# Patient Record
Sex: Male | Born: 1945 | Race: White | Hispanic: No | Marital: Married | Smoking: Former smoker
Health system: Southern US, Community
[De-identification: ages and names within clinical notes are randomized; demographics above are authoritative.]

## PROBLEM LIST (undated history)

## (undated) DIAGNOSIS — C4490 Unspecified malignant neoplasm of skin, unspecified: Secondary | ICD-10-CM

## (undated) DIAGNOSIS — Z8489 Family history of other specified conditions: Secondary | ICD-10-CM

## (undated) DIAGNOSIS — I219 Acute myocardial infarction, unspecified: Secondary | ICD-10-CM

## (undated) DIAGNOSIS — I350 Nonrheumatic aortic (valve) stenosis: Secondary | ICD-10-CM

## (undated) DIAGNOSIS — R519 Headache, unspecified: Secondary | ICD-10-CM

## (undated) DIAGNOSIS — R490 Dysphonia: Secondary | ICD-10-CM

## (undated) DIAGNOSIS — D491 Neoplasm of unspecified behavior of respiratory system: Secondary | ICD-10-CM

## (undated) DIAGNOSIS — Z923 Personal history of irradiation: Secondary | ICD-10-CM

## (undated) DIAGNOSIS — I10 Essential (primary) hypertension: Secondary | ICD-10-CM

## (undated) DIAGNOSIS — C320 Malignant neoplasm of glottis: Secondary | ICD-10-CM

## (undated) HISTORY — PX: CARDIAC CATHETERIZATION: SHX172

## (undated) HISTORY — PX: CORONARY ANGIOPLASTY: SHX604

## (undated) HISTORY — DX: Essential (primary) hypertension: I10

## (undated) HISTORY — DX: Dysphonia: R49.0

## (undated) HISTORY — DX: Neoplasm of unspecified behavior of respiratory system: D49.1

## (undated) HISTORY — PX: COLONOSCOPY: SHX174

## (undated) HISTORY — DX: Unspecified malignant neoplasm of skin, unspecified: C44.90

## (undated) HISTORY — DX: Acute myocardial infarction, unspecified: I21.9

## (undated) HISTORY — PX: OTHER SURGICAL HISTORY: SHX169

---

## 2012-11-15 DIAGNOSIS — C320 Malignant neoplasm of glottis: Secondary | ICD-10-CM

## 2012-11-15 HISTORY — PX: OTHER SURGICAL HISTORY: SHX169

## 2012-11-15 HISTORY — DX: Malignant neoplasm of glottis: C32.0

## 2012-11-26 ENCOUNTER — Encounter: Payer: Self-pay | Admitting: Radiation Oncology

## 2012-11-26 ENCOUNTER — Ambulatory Visit
Admission: RE | Admit: 2012-11-26 | Discharge: 2012-11-26 | Disposition: A | Discharge status: Home / Self Care | Payer: Medicare Other | Source: Ambulatory Visit | Attending: Radiation Oncology | Admitting: Radiation Oncology

## 2012-11-26 VITALS — BP 177/77 | HR 53 | Temp 97.6°F | Wt 182.3 lb

## 2012-11-26 DIAGNOSIS — J383 Other diseases of vocal cords: Secondary | ICD-10-CM

## 2012-11-26 DIAGNOSIS — C320 Malignant neoplasm of glottis: Secondary | ICD-10-CM

## 2012-11-26 HISTORY — DX: Malignant neoplasm of glottis: C32.0

## 2012-11-26 MED ORDER — LARYNGOSCOPY SOLUTION RAD-ONC
15.0000 mL | Freq: Once | TOPICAL | Status: AC
  Administered 2012-11-26: 15 mL via TOPICAL
  Filled 2012-11-26: qty 15

## 2012-11-26 NOTE — Progress Notes (Signed)
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  Prior to diagnosis Mr. Marcus Collier states he began to have continual hoarseness but denies having any dysphagia or odonyphagia.  Denies any significant weight loss.

## 2012-11-26 NOTE — Progress Notes (Signed)
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 male who reports that about one year ago he was yelling quite a bit one day and the next morning he was hoarse. His hoarseness was persistent but nonprogressive for 6 months. He saw Dr. Jenne Pane of otolaryngology in May 2013. At that time laryngoscopy was performed in the left posterior vocal fold had a mild prominent mass covering the posterior half of the fold with no vibration. The vocal cords moved symmetrically.  The patient decided against any treatment at that time and wanted to see how his symptoms would do. His voice did not change significantly; however, after he underwent repeat laryngoscopy on 11/12/2012, it revealed that both true vocal cords appeared irregular with some prominence along the left vocal fold. The cords were mobile with symmetric movements..  Biopsies of both vocal cords were performed. 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 The right vocal cord revealed squamous cell carcinoma in situ with a focus of superficially invasive disease. The anterior commissure (labelled in path report as "the mouth" - I've discussed this with Dr. Jenne Pane and that was a typo) revealed fragments of "at least" squamous cell carcinoma in situ. Dr. Jenne Pane and I in discussion agree that this appears to be T1b disease.  The patient was sent to me for my advice/ counseling regarding management of what appears to be a clinical T1b N0 glottic cancer.    He is otherwise in his usual state of health. He denies any palpable lymph nodes in his neck. He denies any significant weight loss. He denies dysphagia or  odynophagia. 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  he has had skin cancers excised in the past  PAST SURGICAL HISTORY: Past Surgical History  Procedure Date  . Coronary artery surgery   . Biopsy of vocal cord 11/15/12    Right and Left  . Biospy mouth 11/15/12    FAMILY HISTORY: family history includes Heart attack in his father and mother and Hypertension in his father.  SOCIAL HISTORY:  reports that he quit smoking about 10 years ago. His smoking use included Cigarettes. He has a 52 pack-year smoking history. He does not have any smokeless tobacco history on file. He reports that he drinks alcohol.  ALLERGIES: Penicillins  MEDICATIONS:  Current Outpatient Prescriptions  Medication Sig Dispense Refill  . amLODipine (NORVASC) 5 MG tablet Take 5 mg by mouth daily.      Marland Kitchen aspirin 325 MG tablet Take 325 mg by mouth daily.      . carvedilol (COREG) 25 MG tablet 25 mg 2 (two) times daily with a meal.       . quinapril (ACCUPRIL) 40 MG tablet       . simvastatin (ZOCOR) 80 MG tablet        Current Facility-Administered Medications  Medication Dose Route Frequency Provider  Last Rate Last Dose  . [COMPLETED] laryngocopy solution for Rad-Onc  15 mL Topical Once Lonie Peak, MD   15 mL at 11/26/12 1047    REVIEW OF SYSTEMS: A complete review of system was obtained. Pertinent items are noted in HPI.   PHYSICAL EXAM:  weight is 182 lb 4.8 oz (82.691 kg). His temperature is 97.6 F (36.4 C). His blood pressure is 177/77 and his pulse is 53.   General: Alert and oriented, in no acute distress. He is hoarse HEENT: Head is  normocephalic. Pupils are equally round and reactive to light. Extraocular movements are intact. Oropharynx is clear with poor lower dentition; upper dentures. Neck: Neck is supple, no palpable cervical or supraclavicular lymphadenopathy. He does have protuberance of the sternal edge of the right clavicle Heart: Regular in rate and rhythm with no murmurs, rubs, or gallops. Chest: Clear to auscultation bilaterally, with no rhonchi, wheezes, or rales. Abdomen: Soft, nontender, nondistended, with no rigidity or guarding. Extremities: No cyanosis or edema. Lymphatics: No concerning lymphadenopathy. Skin: No concerning lesions. Musculoskeletal: symmetric strength and muscle tone throughout. Neurologic: Cranial nerves II through XII are grossly intact. No obvious focalities. Speech is fluent. Coordination is intact. Psychiatric: Judgment and insight are intact. Affect is appropriate.  Laryngoscopy procedure note: After administering local anesthetic, the laryngoscopy was advanced in the patient's left nostril. The true vocal cords appeared diffusely irregular bilaterally but I was unable to appreciate any obvious asymmetry. Cords were symmetrically mobile. I did not appreciate extension beyond the true vocal cords.   LABORATORY DATA:  No results found for this basename: WBC,  HGB,  HCT,  MCV,  PLT   CMP  No results found for this basename: na,  k,  cl,  co2,  glucose,  bun,  creatinine,  calcium,  prot,  albumin,  ast,  alt,  alkphos,  bilitot,  gfrnonaa,  gfraa        RADIOGRAPHY: No results found.    IMPRESSION/PLAN: This patient has what appears to be clinical T1bN0 glottic cancer - minimally invasive per path report.  He admits that he does smoke a cigarette every month or 2, and an occasional beer. I told him that both of these substances are carcinogenic I recommended that he avoid them as much as possible - ideally, he should not smoke or drink at all. He does not feel the need for any  pharmacologic help with this.  Statistically, the risk of local or distant metastases is very low. I spoke to the patient about this and told him that he could undergo further staging scans but I did not think this was imperative. He is comfortable with deferring any further staging which I think is appropriate.  For his early stage glottic cancer, I recommend external beam radiotherapy to a dose of 63 Gray in 28 fractions at 2.25 gray per fraction. I would treat with opposed lateral beams, extending from the thyroid notch superiorly to the bottom of the cricoid inferiorly,  from the anterior VB to 1cm flash beyond the skin. I will make sure that the dose the anterior commissure it is adequate.  We discussed the risks benefits and side effects of laryngeal radiotherapy which include but are not necessarily limited to fatigue, skin irritation and esophagitis in the acute setting. He understands that late effects may include rare damage to the internal tissues including the cartilage, larynx, and esophagus. He is enthusiastic about proceeding. A consent form has been signed and placed in his chart.  We will simulate him on December 9, anticipating starting treatment a week thereafter.    I spent 60 minutes minutes face to face with the patient and more than 50% of that time was spent in counseling and/or coordination of care.    __________________________________________   Lonie Peak, MD

## 2012-11-26 NOTE — Addendum Note (Signed)
Encounter addended by: Delynn Flavin, RN on: 11/26/2012  5:24 PM<BR>     Documentation filed: Charges VN

## 2012-11-29 NOTE — Addendum Note (Signed)
Encounter addended by: Reshanda Lewey Mintz Shammara Jarrett, RN on: 11/29/2012  5:06 PM<BR>     Documentation filed: Charges VN

## 2012-12-02 ENCOUNTER — Ambulatory Visit
Admission: RE | Admit: 2012-12-02 | Discharge: 2012-12-02 | Disposition: A | Discharge status: Home / Self Care | Payer: Medicare Other | Source: Ambulatory Visit | Attending: Radiation Oncology | Admitting: Radiation Oncology

## 2012-12-02 DIAGNOSIS — R1310 Dysphagia, unspecified: Secondary | ICD-10-CM | POA: Insufficient documentation

## 2012-12-02 DIAGNOSIS — Y842 Radiological procedure and radiotherapy as the cause of abnormal reaction of the patient, or of later complication, without mention of misadventure at the time of the procedure: Secondary | ICD-10-CM | POA: Insufficient documentation

## 2012-12-02 DIAGNOSIS — Z510 Encounter for antineoplastic radiation therapy: Secondary | ICD-10-CM | POA: Insufficient documentation

## 2012-12-02 DIAGNOSIS — J029 Acute pharyngitis, unspecified: Secondary | ICD-10-CM | POA: Insufficient documentation

## 2012-12-02 DIAGNOSIS — C320 Malignant neoplasm of glottis: Secondary | ICD-10-CM | POA: Insufficient documentation

## 2012-12-02 DIAGNOSIS — L988 Other specified disorders of the skin and subcutaneous tissue: Secondary | ICD-10-CM | POA: Insufficient documentation

## 2012-12-02 DIAGNOSIS — J069 Acute upper respiratory infection, unspecified: Secondary | ICD-10-CM | POA: Insufficient documentation

## 2012-12-02 NOTE — Progress Notes (Signed)
INITIAL SIMULATION NOTE  and TREATMENT PLANNING NOTE  Outpatient   Diagnosis: Head and Neck Cancer  - Glottis   Simulation note: The patient was taken to the CT simulator and laid in the supine position with the patient's head in a headrest. An Aquaplast head and shoulder mask was custom fitted to the patient's anatomy. High-resolution CT axial imaging was obtained of the patient's head and neck. I verified that the imaging was good quality for treatment planning.   Treatment planning note: I plan to treat the patient with opposed oblique fields, using wedges as needed for dose homogeneity. I plan to treat from the thyroid notch superiorly to the bottom of the cricoid inferiorly, extending from the anterior vertebral body to 1 cm flash be on the skin. I plan to prescribe 63 Gray in 28 fractions at 2.25 Wallace Cullens per fraction  _____________________________  Lonie Peak, MD

## 2012-12-09 ENCOUNTER — Ambulatory Visit
Admission: RE | Admit: 2012-12-09 | Discharge: 2012-12-09 | Disposition: A | Discharge status: Home / Self Care | Payer: Medicare Other | Source: Ambulatory Visit | Attending: Radiation Oncology | Admitting: Radiation Oncology

## 2012-12-09 ENCOUNTER — Encounter: Payer: Self-pay | Admitting: Radiation Oncology

## 2012-12-09 NOTE — Progress Notes (Signed)
Simulation Verification Note Outpatient, Glottis, Stage I  The patient was brought to the treatment unit and placed in the planned treatment position. The clinical setup was verified. Then port films were obtained and uploaded to the radiation oncology medical record software.  The treatment beams were carefully compared against the planned radiation fields. The position location and shape of the radiation fields was reviewed. They targeted volume of tissue appears to be appropriately covered by the radiation beams. Organs at risk appear to be excluded as planned.  Based on my personal review, I approved the simulation verification. The patient's treatment will proceed as planned.  -----------------------------------  Lonie Peak, MD

## 2012-12-10 ENCOUNTER — Ambulatory Visit
Admission: RE | Admit: 2012-12-10 | Discharge: 2012-12-10 | Disposition: A | Discharge status: Home / Self Care | Payer: Medicare Other | Source: Ambulatory Visit | Attending: Radiation Oncology | Admitting: Radiation Oncology

## 2012-12-11 ENCOUNTER — Ambulatory Visit
Admission: RE | Admit: 2012-12-11 | Discharge: 2012-12-11 | Disposition: A | Discharge status: Home / Self Care | Payer: Medicare Other | Source: Ambulatory Visit | Attending: Radiation Oncology | Admitting: Radiation Oncology

## 2012-12-12 ENCOUNTER — Ambulatory Visit
Admission: RE | Admit: 2012-12-12 | Discharge: 2012-12-12 | Disposition: A | Discharge status: Home / Self Care | Payer: Medicare Other | Source: Ambulatory Visit | Attending: Radiation Oncology | Admitting: Radiation Oncology

## 2012-12-13 ENCOUNTER — Ambulatory Visit
Admission: RE | Admit: 2012-12-13 | Discharge: 2012-12-13 | Disposition: A | Discharge status: Home / Self Care | Payer: Medicare Other | Source: Ambulatory Visit | Attending: Radiation Oncology | Admitting: Radiation Oncology

## 2012-12-16 ENCOUNTER — Ambulatory Visit
Admission: RE | Admit: 2012-12-16 | Discharge: 2012-12-16 | Disposition: A | Discharge status: Home / Self Care | Payer: Medicare Other | Source: Ambulatory Visit | Attending: Radiation Oncology | Admitting: Radiation Oncology

## 2012-12-16 ENCOUNTER — Encounter: Payer: Self-pay | Admitting: Radiation Oncology

## 2012-12-16 VITALS — BP 117/63 | HR 59 | Temp 97.8°F | Wt 180.9 lb

## 2012-12-16 DIAGNOSIS — C320 Malignant neoplasm of glottis: Secondary | ICD-10-CM

## 2012-12-16 MED ORDER — SUCRALFATE 1 G PO TABS: 1.0000 g | ORAL_TABLET | Freq: Four times a day (QID) | ORAL | Status: DC

## 2012-12-16 MED ORDER — BIAFINE EX EMUL
CUTANEOUS | Status: DC | PRN
  Administered 2012-12-16: 1 via TOPICAL

## 2012-12-16 MED ORDER — MAGIC MOUTHWASH W/LIDOCAINE: 10.0000 mL | Freq: Four times a day (QID) | ORAL | Status: DC | PRN

## 2012-12-16 NOTE — Addendum Note (Signed)
Encounter addended by: Delynn Flavin, RN on: 12/16/2012  5:56 PM<BR>     Documentation filed: Inpatient MAR, Orders

## 2012-12-16 NOTE — Patient Instructions (Signed)
Alternate carafate with mouthwash as needed to soothe throat.

## 2012-12-16 NOTE — Progress Notes (Signed)
   Weekly Management Note:  Outpatient Current Dose:  11.25 Gy  Projected Dose: 63 Gy   Narrative:  The patient presents for routine under treatment assessment.  CBCT/MVCT images/Port film x-rays were reviewed.  The chart was checked. 5/28 fractions to larynx. C/o "mild sore throat" Today, but he thinks it may be related to his sinus or "potential cold".    Given Biafine for skin with instructions to apply BID, after treatment and at bedtime.  Physical Findings: Blood pressure 117/63, pulse 59, temperature 97.8, weight 180 pounds no skin irritation thus far. He is hoarse.  Impression:  The patient is tolerating radiotherapy.  Plan:  Continue radiotherapy as planned. I will make E. prescriptions for Carafate and Magic mouthwash to his [facility] pharmacy. Patient started on how to use these to soothe a sore throat.  ________________________________   Lonie Peak, M.D.

## 2012-12-16 NOTE — Progress Notes (Signed)
5/28 fractions to larynx.  C/o "mild sore throat"  Today, but he thinks it may be related to his sinus or "potential cold".  Education today regarding esophagitis, mucositis, xerostomia, skin care management, diet management, adding protein and calories.  Given Radiation Therapy information sheets regarding above topics.  Given Biafine for skin with instructions to apply BID, after treatment and at bedtime.  Informed that Dr. Basilio Cairo will see him every Monday following treatment, but if this changes he will be notified in advance.

## 2012-12-17 ENCOUNTER — Ambulatory Visit
Admission: RE | Admit: 2012-12-17 | Discharge: 2012-12-17 | Disposition: A | Discharge status: Home / Self Care | Payer: Medicare Other | Source: Ambulatory Visit | Attending: Radiation Oncology | Admitting: Radiation Oncology

## 2012-12-19 ENCOUNTER — Ambulatory Visit
Admission: RE | Admit: 2012-12-19 | Discharge: 2012-12-19 | Disposition: A | Discharge status: Home / Self Care | Payer: Medicare Other | Source: Ambulatory Visit | Attending: Radiation Oncology | Admitting: Radiation Oncology

## 2012-12-20 ENCOUNTER — Ambulatory Visit
Admission: RE | Admit: 2012-12-20 | Discharge: 2012-12-20 | Disposition: A | Discharge status: Home / Self Care | Payer: Medicare Other | Source: Ambulatory Visit | Attending: Radiation Oncology | Admitting: Radiation Oncology

## 2012-12-23 ENCOUNTER — Ambulatory Visit
Admission: RE | Admit: 2012-12-23 | Discharge: 2012-12-23 | Disposition: A | Discharge status: Home / Self Care | Payer: Medicare Other | Source: Ambulatory Visit | Attending: Radiation Oncology | Admitting: Radiation Oncology

## 2012-12-23 ENCOUNTER — Encounter: Payer: Self-pay | Admitting: Radiation Oncology

## 2012-12-23 VITALS — BP 120/66 | HR 62 | Wt 181.3 lb

## 2012-12-23 DIAGNOSIS — C320 Malignant neoplasm of glottis: Secondary | ICD-10-CM

## 2012-12-23 NOTE — Progress Notes (Signed)
Weekly Management Note:  Site: Larynx Current Dose:  2025  cGy Projected Dose: 6300  cGy  Narrative: The patient is seen today for routine under treatment assessment. CBCT/MVCT images/port films were reviewed. The chart was reviewed.   He does report morning throat discomfort which she treated spray into his mouth since he does have a URI with nasal congestion. He has not used Carafate or Magic mouthwash , nor does he have any pain medication to date.  Physical Examination:  Filed Vitals:   12/23/12 0944  BP: 120/66  Pulse: 62  .  Weight: 181 lb 4.8 oz (82.237 kg). Oral cavity and oropharynx unremarkable to inspection. Neck examination is unremarkable.  Impression: Tolerating radiation therapy well.  Plan: Continue radiation therapy as planned.

## 2012-12-23 NOTE — Progress Notes (Signed)
Patient presents to the clinic today unaccompanied for PUT with Dr. Dayton Scrape. Patient alert and oriented to person, place, and time. No distress noted. Steady gait noted. Pleasant affect noted. Patient denies pain at this time. Patient reports dry mouth and difficulty/painful swallowing only first thing in the morning upon rising. Patient relates this to a head cold that is "causing him to breath through his mouth." Patient has script for carafate and magic mouthwash which he has yet to begin to use. Educated patient on use of carafate and magic mouthwash then, he verbalized understanding. Patient reports an occasional productive cough with thick gray to brown sputum without hemoptysis. Patient reports an occasional blood tinged nasal drainage. Patient denies shortness of breath. Patient denies sores or ulcerations of the mouth. Cleared up questions/confusion from post sim education. Patient verbalized understanding of all reviewed. Reported all findings to Dr. Dayton Scrape.

## 2012-12-24 ENCOUNTER — Ambulatory Visit
Admission: RE | Admit: 2012-12-24 | Discharge: 2012-12-24 | Disposition: A | Discharge status: Home / Self Care | Payer: Medicare Other | Source: Ambulatory Visit | Attending: Radiation Oncology | Admitting: Radiation Oncology

## 2012-12-26 ENCOUNTER — Ambulatory Visit
Admission: RE | Admit: 2012-12-26 | Discharge: 2012-12-26 | Disposition: A | Discharge status: Home / Self Care | Payer: Medicare Other | Source: Ambulatory Visit | Attending: Radiation Oncology | Admitting: Radiation Oncology

## 2012-12-27 ENCOUNTER — Ambulatory Visit
Admission: RE | Admit: 2012-12-27 | Discharge: 2012-12-27 | Disposition: A | Discharge status: Home / Self Care | Payer: Medicare Other | Source: Ambulatory Visit | Attending: Radiation Oncology | Admitting: Radiation Oncology

## 2012-12-30 ENCOUNTER — Ambulatory Visit
Admission: RE | Admit: 2012-12-30 | Discharge: 2012-12-30 | Disposition: A | Discharge status: Home / Self Care | Payer: Medicare Other | Source: Ambulatory Visit | Attending: Radiation Oncology | Admitting: Radiation Oncology

## 2012-12-30 ENCOUNTER — Encounter: Payer: Self-pay | Admitting: Radiation Oncology

## 2012-12-30 VITALS — BP 143/59 | HR 53 | Temp 98.3°F | Resp 20 | Wt 182.8 lb

## 2012-12-30 DIAGNOSIS — C320 Malignant neoplasm of glottis: Secondary | ICD-10-CM

## 2012-12-30 NOTE — Progress Notes (Addendum)
Pt denies pain, states he has very slight discomfort w/swallwoing. Pt eating all regular foods w/o difficulty. Denies fatigue, loss of appetite. Called [facility] pharmacy for pt; Kim at pharmacy states they tried to reach pt to let him know his insurance will not cover this med. She states it will cost pt $84 plus tax. Informed pt who states he will hold off on getting med as he has no need for it at this time. Enc pt to let dr know if he begins to have difficulty eating. Pt verbalized understanding.

## 2012-12-30 NOTE — Progress Notes (Signed)
   Weekly Management Note:  outpatient Current Dose:  29.25 Gy  Projected Dose: 63 Gy   Narrative:  The patient presents for routine under treatment assessment.  CBCT/MVCT images/Port film x-rays were reviewed.  The chart was checked. Denies obvious odynophagia. Slight discomfort with swallowing. Even regular foods without difficulty. He is not applying radiaplex as faithfully as he knows he should.  Physical Findings:  weight is 182 lb 12.8 oz (82.918 kg). His oral temperature is 98.3 F (36.8 C). His blood pressure is 143/59 and his pulse is 53. His respiration is 20.  moderate erythema over the neck in the treatment fields.  Impression:  The patient is tolerating radiotherapy.  Plan:  Continue radiotherapy as planned. Encouraged the patient to start using radiaplex 3 times a day following treatments. He has sucralfate to use as needed and will fill his prescription for Magic mouthwash if he decides to do so in the future.  ________________________________   Marcus Collier, M.D.

## 2012-12-31 ENCOUNTER — Ambulatory Visit
Admission: RE | Admit: 2012-12-31 | Discharge: 2012-12-31 | Disposition: A | Discharge status: Home / Self Care | Payer: Medicare Other | Source: Ambulatory Visit | Attending: Radiation Oncology | Admitting: Radiation Oncology

## 2013-01-01 ENCOUNTER — Ambulatory Visit
Admission: RE | Admit: 2013-01-01 | Discharge: 2013-01-01 | Disposition: A | Discharge status: Home / Self Care | Payer: Medicare Other | Source: Ambulatory Visit | Attending: Radiation Oncology | Admitting: Radiation Oncology

## 2013-01-02 ENCOUNTER — Ambulatory Visit
Admission: RE | Admit: 2013-01-02 | Discharge: 2013-01-02 | Disposition: A | Discharge status: Home / Self Care | Payer: Medicare Other | Source: Ambulatory Visit | Attending: Radiation Oncology | Admitting: Radiation Oncology

## 2013-01-03 ENCOUNTER — Ambulatory Visit
Admission: RE | Admit: 2013-01-03 | Discharge: 2013-01-03 | Disposition: A | Discharge status: Home / Self Care | Payer: Medicare Other | Source: Ambulatory Visit | Attending: Radiation Oncology | Admitting: Radiation Oncology

## 2013-01-06 ENCOUNTER — Ambulatory Visit
Admission: RE | Admit: 2013-01-06 | Discharge: 2013-01-06 | Disposition: A | Discharge status: Home / Self Care | Payer: Medicare Other | Source: Ambulatory Visit | Attending: Radiation Oncology | Admitting: Radiation Oncology

## 2013-01-06 ENCOUNTER — Encounter: Payer: Self-pay | Admitting: Radiation Oncology

## 2013-01-06 VITALS — BP 136/53 | HR 53 | Temp 97.8°F | Resp 20 | Wt 183.5 lb

## 2013-01-06 DIAGNOSIS — C320 Malignant neoplasm of glottis: Secondary | ICD-10-CM

## 2013-01-06 NOTE — Progress Notes (Signed)
   Weekly Management Note:  Outpatient Current Dose:  40.5 Gy  Projected Dose: 63 Gy   Narrative:  The patient presents for routine under treatment assessment.  CBCT/MVCT images/Port film x-rays were reviewed.  The chart was checked. Some increase in his odynophagia. Has not felt the need to use Carafate or Magic mouthwash yet. Using a humidifier at home which he thinks helps. He is applying Biafine to his neck as instructed.  Physical Findings:  weight is 183 lb 8 oz (83.235 kg). His oral temperature is 97.8 F (36.6 C). His blood pressure is 136/53 and his pulse is 53. His respiration is 20.  erythema over anterior neck, skin intact  Impression:  The patient is tolerating radiotherapy.  Plan:  Continue radiotherapy as planned. Encouraged to use Carafate and Magic mouthwash as needed.  ________________________________   Lonie Peak, M.D.

## 2013-01-06 NOTE — Progress Notes (Signed)
Pt states he is having a "little more pain w/swallowing saliva but not with eating". He wakes w/dry, sore throat, uses a humidifier. Pt still not taking Carafate nor has picked up MMW script, does sip on liquids during day. Applying Biafine to skin in tx area for hyperpigmentation, dry desquamation. Denies fatigue.

## 2013-01-07 ENCOUNTER — Ambulatory Visit
Admission: RE | Admit: 2013-01-07 | Discharge: 2013-01-07 | Disposition: A | Discharge status: Home / Self Care | Payer: Medicare Other | Source: Ambulatory Visit | Attending: Radiation Oncology | Admitting: Radiation Oncology

## 2013-01-08 ENCOUNTER — Ambulatory Visit
Admission: RE | Admit: 2013-01-08 | Discharge: 2013-01-08 | Disposition: A | Discharge status: Home / Self Care | Payer: Medicare Other | Source: Ambulatory Visit | Attending: Radiation Oncology | Admitting: Radiation Oncology

## 2013-01-09 ENCOUNTER — Ambulatory Visit
Admission: RE | Admit: 2013-01-09 | Discharge: 2013-01-09 | Disposition: A | Discharge status: Home / Self Care | Payer: Medicare Other | Source: Ambulatory Visit | Attending: Radiation Oncology | Admitting: Radiation Oncology

## 2013-01-10 ENCOUNTER — Ambulatory Visit
Admission: RE | Admit: 2013-01-10 | Discharge: 2013-01-10 | Disposition: A | Discharge status: Home / Self Care | Payer: Medicare Other | Source: Ambulatory Visit | Attending: Radiation Oncology | Admitting: Radiation Oncology

## 2013-01-13 ENCOUNTER — Ambulatory Visit
Admission: RE | Admit: 2013-01-13 | Discharge: 2013-01-13 | Disposition: A | Discharge status: Home / Self Care | Payer: Medicare Other | Source: Ambulatory Visit | Attending: Radiation Oncology | Admitting: Radiation Oncology

## 2013-01-13 ENCOUNTER — Other Ambulatory Visit: Payer: Self-pay | Admitting: Radiation Oncology

## 2013-01-13 VITALS — BP 136/60 | HR 52 | Temp 98.1°F | Wt 181.0 lb

## 2013-01-13 DIAGNOSIS — C320 Malignant neoplasm of glottis: Secondary | ICD-10-CM

## 2013-01-13 MED ORDER — BIAFINE EX EMUL
CUTANEOUS | Status: DC | PRN
  Administered 2013-01-13: 1 via TOPICAL

## 2013-01-13 MED ORDER — HYDROCODONE-ACETAMINOPHEN 7.5-325 MG/15ML PO SOLN: 10.0000 mL | ORAL | Status: DC | PRN

## 2013-01-13 NOTE — Progress Notes (Signed)
   Weekly Management Note:  Outpatient Current Dose:   51.75 Gy  Projected Dose: 63 Gy   Narrative:  The patient presents for routine under treatment assessment.  CBCT/MVCT images/Port film x-rays were reviewed.  The chart was checked. He reports that his throat is sore. He has not started Carafate or Magic mouthwash. The Magic mouthwash is too expensive. He needs more Biafine.  Physical Findings:  weight is 181 lb (82.101 kg). His temperature is 98.1 F (36.7 C). His blood pressure is 136/60 and his pulse is 52.  bright erythema over his anterior neck  Impression:  The patient is tolerating radiotherapy.  Plan:  Continue radiotherapy as planned. I recommended starting Carafate. I also gave the patient a prescription for Hycet to use if needed for persistent pain. I also suggested he use Colace to prevent constipation on narcotics.  Another tube of Biafine was given today.  ________________________________   Lonie Peak, M.D.

## 2013-01-13 NOTE — Patient Instructions (Signed)
Take 10-38mL of Hycet every 4 hrs with food as need for pain. Take Docusate Sodium (Colace) 1 capsule twice a day to prevent constipation while on Hycet.

## 2013-01-13 NOTE — Progress Notes (Signed)
Rports that he is not using Magic Mouthwash because it is too expensive and he has not started Carafate.  Encouraged patient to start the Carafate today.

## 2013-01-13 NOTE — Progress Notes (Signed)
Marcus Collier has received 23 of 28 fractions to his Larynx.  Grades throat as a level 7 on scale of 0-10.  C/o dry mouth mouth and throat and he states when he coughs his throat hurts, but he doesn't "necessarily have pain upon swallowing".  Bright erythema on anterior neck, but skin intact.  Requesting "somthing" for his sore throat.  Given another tube of Biafine.

## 2013-01-14 ENCOUNTER — Ambulatory Visit
Admission: RE | Admit: 2013-01-14 | Discharge: 2013-01-14 | Disposition: A | Discharge status: Home / Self Care | Payer: Medicare Other | Source: Ambulatory Visit | Attending: Radiation Oncology | Admitting: Radiation Oncology

## 2013-01-15 ENCOUNTER — Ambulatory Visit
Admission: RE | Admit: 2013-01-15 | Discharge: 2013-01-15 | Disposition: A | Discharge status: Home / Self Care | Payer: Medicare Other | Source: Ambulatory Visit | Attending: Radiation Oncology | Admitting: Radiation Oncology

## 2013-01-16 ENCOUNTER — Ambulatory Visit
Admission: RE | Admit: 2013-01-16 | Discharge: 2013-01-16 | Disposition: A | Discharge status: Home / Self Care | Payer: Medicare Other | Source: Ambulatory Visit | Attending: Radiation Oncology | Admitting: Radiation Oncology

## 2013-01-17 ENCOUNTER — Ambulatory Visit
Admission: RE | Admit: 2013-01-17 | Discharge: 2013-01-17 | Disposition: A | Discharge status: Home / Self Care | Payer: Medicare Other | Source: Ambulatory Visit | Attending: Radiation Oncology | Admitting: Radiation Oncology

## 2013-01-20 ENCOUNTER — Encounter: Payer: Self-pay | Admitting: Radiation Oncology

## 2013-01-20 ENCOUNTER — Ambulatory Visit
Admission: RE | Admit: 2013-01-20 | Discharge: 2013-01-20 | Disposition: A | Discharge status: Home / Self Care | Payer: Medicare Other | Source: Ambulatory Visit | Attending: Radiation Oncology | Admitting: Radiation Oncology

## 2013-01-20 ENCOUNTER — Ambulatory Visit: Payer: Medicare Other

## 2013-01-20 VITALS — BP 133/66 | HR 59 | Resp 16 | Wt 178.5 lb

## 2013-01-20 DIAGNOSIS — C320 Malignant neoplasm of glottis: Secondary | ICD-10-CM

## 2013-01-20 MED ORDER — HYDROCODONE-ACETAMINOPHEN 7.5-325 MG/15ML PO SOLN: 10.0000 mL | ORAL | Status: DC | PRN

## 2013-01-20 NOTE — Progress Notes (Signed)
Patient presents to the clinic today unaccompanied for PUT with Dr. Basilio Cairo following final treatment. Patient is alert and oriented to person, place, and time. No distress noted. Steady gait noted. Pleasant affect noted. Patient reports constant throat pain 6 on a scale of 0-10 which increases to a 9.5 upon coughing. Patient reports that the cough is mostly dry but, occasionally a very scant amount of clear blood specked sputum will come up. Patient reports hoarseness is really no worse or better. Hyperpigmentation without desquamation of anterior neck noted. Patient reports using biafine bid. Encouraged patient to continue this habit for the next two weeks and he verbalized understanding. Patient denies dry mouth. Patient reports thick watery saliva. Patient denies using magic mouthwash because insurance doesn't cover it. Patient denies using carafate because it doesn't help. Patient concern about hycet script because prescribed qty of 120 ml only last 2.5 days but, patient reports understandable increasing throat pain and irritation now at the end of treatment. Patient reports eating smaller frequent meals but, denies that his diet/food intake has been adjusted. Three pound weight loss noted since 01/13/2013. Reported all findings to Dr. Basilio Cairo.

## 2013-01-20 NOTE — Addendum Note (Signed)
Encounter addended by: Delynn Flavin, RN on: 01/20/2013  7:49 PM<BR>     Documentation filed: Demographics Visit

## 2013-01-20 NOTE — Progress Notes (Signed)
   Weekly Management Note:  Outpatient Current Dose:  63 Gy  Projected Dose: 63 Gy   Narrative:  The patient presents for routine under treatment assessment.  CBCT/MVCT images/Port film x-rays were reviewed.  The chart was checked. He complete radiotherapy today.  His main complaint is throat pain. Taking Hycet.  Physical Findings:  weight is 178 lb 8 oz (80.967 kg). His blood pressure is 133/66 and his pulse is 59. His respiration is 16.  bright erythema over his anterior neck with some dry desquamation  Impression:  The patient is tolerating radiotherapy.  Plan: Patient given one-month followup card. Continue Biafine. Continue Hycet. Refills given for this. ________________________________   Lonie Peak, M.D.

## 2013-01-21 ENCOUNTER — Telehealth: Payer: Self-pay | Admitting: *Deleted

## 2013-01-21 ENCOUNTER — Ambulatory Visit: Payer: Medicare Other

## 2013-01-21 NOTE — Progress Notes (Signed)
  Radiation Oncology         [id number]) [phone] ________________________________  Name: Marcus Collier MRN: [id number]  Date: 01/20/2013  DOB: 1946/04/11  End of Treatment Note  Diagnosis:   Glottic Squamous cell carcinoma, T1b N0 M0  Indication for treatment:      Curative  Radiation treatment dates:   12/10/2012-01/20/2013  Site/dose:   vocal cords/ 63 Gray in 28 fractions  Beams/energy:   Opposed laterals / photons  Narrative: The patient tolerated radiation treatment relatively well.   By the completion of therapy he had throat pain which is alleviated partially by hydrocodone elixir. His neck developed erythema and dry desquamation.  Plan: The patient has completed radiation treatment. The patient will return to radiation oncology clinic for routine followup in one month. I advised them to call or return sooner if they have any questions or concerns related to their recovery or treatment.  -----------------------------------  Lonie Peak, MD

## 2013-01-21 NOTE — Telephone Encounter (Signed)
Called patient to inform that script is ready for pick-up, lvm for a return call

## 2013-01-22 ENCOUNTER — Ambulatory Visit: Payer: Medicare Other

## 2013-02-20 ENCOUNTER — Encounter: Payer: Self-pay | Admitting: Radiation Oncology

## 2013-02-21 ENCOUNTER — Ambulatory Visit
Admission: RE | Admit: 2013-02-21 | Discharge: 2013-02-21 | Disposition: A | Discharge status: Home / Self Care | Payer: Medicare Other | Source: Ambulatory Visit | Attending: Radiation Oncology | Admitting: Radiation Oncology

## 2013-02-21 ENCOUNTER — Encounter: Payer: Self-pay | Admitting: Radiation Oncology

## 2013-02-21 VITALS — BP 157/65 | HR 59 | Temp 98.5°F | Wt 180.0 lb

## 2013-02-21 HISTORY — DX: Personal history of irradiation: Z92.3

## 2013-02-21 NOTE — Progress Notes (Signed)
  Radiation Oncology         [phone]) [phone] ________________________________  Name: Marcus Collier MRN: [id number]  Date: 02/21/2013  DOB: 12/15/1946  Follow-Up Visit Note  CC: No primary provider on file.  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  He is doing well. His voice is much less hoarse. He reports that his throat is not very sore at all. He is able to eat all foods that he desires.                            ALLERGIES:  is allergic to penicillins.  Meds: Current Outpatient Prescriptions  Medication Sig Dispense Refill  . amLODipine (NORVASC) 5 MG tablet Take 5 mg by mouth daily.      . carvedilol (COREG) 25 MG tablet 25 mg 2 (two) times daily with a meal.       . quinapril (ACCUPRIL) 40 MG tablet       . aspirin 325 MG tablet Take 325 mg by mouth daily.      . simvastatin (ZOCOR) 80 MG tablet        No current facility-administered medications for this encounter.    Physical Findings: The patient is in no acute distress. Patient is alert and oriented.  weight is 180 lb (81.647 kg). His temperature is 98.5 F (36.9 C). His blood pressure is 157/65 and his pulse is 59. His oxygen saturation is 99%. . Sitting comfortably in a chair in no acute distress. There is some moderate residual hyperpigmentation and erythema over the anterior mid neck. Skin is intact. Oropharynx is clear. Voice is much less hoarse  Lab Findings: No results found for this basename: WBC, HGB, HCT, MCV, PLT    CMP  No results found for this basename: na, k, cl, co2, glucose, bun, creatinine, calcium, prot, albumin, ast, alt, alkphos, bilitot, gfrnonaa, gfraa     Radiographic Findings: No results found.  Impression/Plan:    1) Head and Neck Cancer Status: Clinically, recovering well from  radiotherapy, with voice improvement  2) Nutritional Status: Good, will continue to follow  3) Risk Factors: The patient has been educated about risk factors including alcohol and tobacco abuse; they understand that avoidance of alcohol and tobacco is important to prevent recurrences as well as other cancers  4) Swallowing: Good, we will continue to follow  5) Energy: Good, we'll continue to follow  6) the patient does not have any scheduled followup in otolaryngology. Advised him to call otolaryngology to schedule followup.  7) Follow-up in 3 months. At that time I plan to perform a laryngoscopy. I recommended that the patient also undergo laryngoscopy with his otolaryngologist.  The patient was encouraged to call with any issues or questions before then.  I spent 20 minutes minutes face to face with the patient and more than 50% of that time was spent in counseling and/or coordination of care. _____________________________________   Lonie Peak, MD

## 2013-02-21 NOTE — Progress Notes (Signed)
FU appointment today post radiation to the Glottic region.  He denies any dysphagia or odonyphagia.  He is Barista he wants."  Skin on neck is intact.  He has not returned to see Dr. Jearld Fenton because he feels he needs to change physicians at this time.

## 2013-05-23 ENCOUNTER — Ambulatory Visit
Admission: RE | Admit: 2013-05-23 | Discharge: 2013-05-23 | Disposition: A | Discharge status: Home / Self Care | Payer: Medicare Other | Source: Ambulatory Visit | Attending: Radiation Oncology | Admitting: Radiation Oncology

## 2013-05-23 ENCOUNTER — Encounter: Payer: Self-pay | Admitting: Radiation Oncology

## 2013-05-23 VITALS — BP 141/81 | HR 63 | Temp 98.1°F | Ht 69.0 in | Wt 183.8 lb

## 2013-05-23 DIAGNOSIS — Z7982 Long term (current) use of aspirin: Secondary | ICD-10-CM | POA: Insufficient documentation

## 2013-05-23 DIAGNOSIS — C320 Malignant neoplasm of glottis: Secondary | ICD-10-CM | POA: Insufficient documentation

## 2013-05-23 DIAGNOSIS — Z79899 Other long term (current) drug therapy: Secondary | ICD-10-CM | POA: Insufficient documentation

## 2013-05-23 DIAGNOSIS — Z923 Personal history of irradiation: Secondary | ICD-10-CM | POA: Insufficient documentation

## 2013-05-23 DIAGNOSIS — F17200 Nicotine dependence, unspecified, uncomplicated: Secondary | ICD-10-CM | POA: Insufficient documentation

## 2013-05-23 MED ORDER — FLUCONAZOLE 100 MG PO TABS: ORAL_TABLET | ORAL | Status: DC

## 2013-05-23 MED ORDER — LARYNGOSCOPY SOLUTION RAD-ONC
15.0000 mL | Freq: Once | TOPICAL | Status: AC
  Administered 2013-05-23: 15 mL via TOPICAL

## 2013-05-23 NOTE — Progress Notes (Signed)
Marcus Collier here today for fu following radiation therapy for his glottic.  He denies any fatigue.  He reports that the only has "some discomfort when swallowing if the food has a hard crust or is heavily breaded.   Hi sneck is without nay desquamation and has mild hyperpigmentation.   He is accompanied by his wife. He is working full-time and after dinner he falls asleep.

## 2013-05-23 NOTE — Progress Notes (Signed)
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  Despite my recommendations in Feb, he has still not initiated f/u with ENT.  He continues to smoke.   Voice is less hoarse.   No fatigue.  Swallowing well, except tough foods like hard crusts. Working full time. Good energy.                  ALLERGIES:  is allergic to penicillins.  Meds: Current Outpatient Prescriptions  Medication Sig Dispense Refill  . amLODipine (NORVASC) 5 MG tablet Take 5 mg by mouth daily.      Marland Kitchen aspirin 325 MG tablet Take 325 mg by mouth daily.      . carvedilol (COREG) 25 MG tablet 25 mg 2 (two) times daily with a meal.       . quinapril (ACCUPRIL) 40 MG tablet       . simvastatin (ZOCOR) 80 MG tablet daily.       . fluconazole (DIFLUCAN) 100 MG tablet Take 2 tabs today, then 1 tab daily x 20 more days. Hold Simvastatin.  22 tablet  0   No current facility-administered medications for this encounter.    Physical Findings: The patient is in no acute distress. Patient is alert and oriented.  height is 5\' 9"  (1.753 m) and weight is 183 lb 12.8 oz (83.371 kg). His temperature is 98.1 F (36.7 C). His blood pressure is 141/81 and his pulse is 63. . Skin over neck is hyperpigmented in RT fields but improving.  Voice less hoarse.  Laryngoscopy:  White surface along vallecula most suspicious for thrush.  Could not visualize true cords as patient had trouble tolerating procedure and refused continuation.  Lab Findings: No results found for this basename: WBC, HGB, HCT, MCV, PLT    CMP  No results found for this basename: na, k, cl, co2, glucose,  bun, creatinine, calcium, prot, albumin, ast, alt, alkphos, bilitot, gfrnonaa, gfraa     Radiographic Findings: No results found.  Impression/Plan:    1) Head and Neck Cancer Status: recovering well from RT. Needs laryngoscopy and f/u in ENT clinic.  2) Nutritional Status: - weight:stable - PEG tube:none  3) Risk Factors: The patient has been educated about risk factors including alcohol and tobacco abuse; they understand that avoidance of alcohol and tobacco is important to prevent recurrences as well as other cancers - continues to smoke.  The patient continues to use tobacco. The patient was counseled to stop using tobacco and was offered pharmacotherapy and further counseling to help with this. The patient declined pharmacotherapy and further counseling at this time.  Therefore, I recommended E- cigarettes instead of "smoking."  4) Swallowing: Good  5) Energy:  Good - consider checking TSH at next visit.  6) Social: No active social issues to address at this time  7) Follow-up in 3 months. The patient was encouraged to call with any issues or questions before then. He needs f/u in ENT clinic.  He said he will call them.  8) Fluconazole Rx'd for suspected thrush in vallecula. Hold Simvastatin while on this.  I spent 25 minutes  face to face with the patient and more than 50% of that time was spent in counseling and/or coordination of care. _____________________________________   Lonie Peak, MD

## 2013-05-27 ENCOUNTER — Encounter: Payer: Self-pay | Admitting: Radiation Oncology

## 2013-05-30 ENCOUNTER — Telehealth: Payer: Self-pay | Admitting: *Deleted

## 2013-05-30 NOTE — Telephone Encounter (Signed)
Called patient to inform of appt. With Dr. Pollyann Kennedy on 06-06-13- arrival time - 2:05 p.m., spoke with patient and he is aware of this appt.

## 2013-08-29 ENCOUNTER — Ambulatory Visit: Admission: RE | Admit: 2013-08-29 | Payer: Medicare Other | Source: Ambulatory Visit | Admitting: Radiation Oncology

## 2013-09-08 ENCOUNTER — Encounter: Payer: Self-pay | Admitting: *Deleted

## 2013-11-05 ENCOUNTER — Telehealth: Payer: Self-pay | Admitting: *Deleted

## 2013-11-05 NOTE — Telephone Encounter (Signed)
Left message for patient to call to reschedule missed follow up appointment with Dr. Basilio Cairo in September 2014

## 2014-05-20 ENCOUNTER — Ambulatory Visit
Admission: RE | Admit: 2014-05-20 | Discharge: 2014-05-20 | Disposition: A | Discharge status: Home / Self Care | Payer: Medicare Other | Source: Ambulatory Visit | Attending: Radiation Oncology | Admitting: Radiation Oncology

## 2014-05-20 ENCOUNTER — Encounter: Payer: Self-pay | Admitting: Radiation Oncology

## 2014-05-20 VITALS — BP 137/63 | HR 55 | Temp 98.4°F | Resp 20 | Wt 181.7 lb

## 2014-05-20 DIAGNOSIS — E890 Postprocedural hypothyroidism: Secondary | ICD-10-CM

## 2014-05-20 DIAGNOSIS — Z7982 Long term (current) use of aspirin: Secondary | ICD-10-CM | POA: Insufficient documentation

## 2014-05-20 DIAGNOSIS — Z923 Personal history of irradiation: Secondary | ICD-10-CM | POA: Insufficient documentation

## 2014-05-20 DIAGNOSIS — Z79899 Other long term (current) drug therapy: Secondary | ICD-10-CM | POA: Insufficient documentation

## 2014-05-20 DIAGNOSIS — C320 Malignant neoplasm of glottis: Secondary | ICD-10-CM | POA: Insufficient documentation

## 2014-05-20 LAB — T4, FREE: Free T4: 1.16 ng/dL (ref 0.80–1.80)

## 2014-05-20 LAB — TSH CHCC: TSH: 0.967 m(IU)/L (ref 0.320–4.118)

## 2014-05-20 MED ORDER — LARYNGOSCOPY SOLUTION RAD-ONC
15.0000 mL | Freq: Once | TOPICAL | Status: AC
  Administered 2014-05-20: 15 mL via TOPICAL
  Filled 2014-05-20: qty 15

## 2014-05-20 NOTE — Progress Notes (Signed)
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 He was a no show in September. He denies pain. He still has not seen his ENT doctor despite my recommendations. He states he has been having a dripping nose for several months and occasional crack in voice. No swallowing problems. Weight maintained.He had a basal cell removed from forehead approximately 6 to 8 weeks ago at at [facility]. Energy is good.   ALLERGIES:  is allergic to penicillins.  Meds: Current Outpatient Prescriptions  Medication Sig Dispense Refill  . amLODipine (NORVASC) 5 MG tablet Take 5 mg by mouth daily.      Marland Kitchen aspirin 325 MG tablet Take 325 mg by mouth daily.      . carvedilol (COREG) 25 MG tablet 25 mg 2 (two) times daily with a meal.       . quinapril (ACCUPRIL) 40 MG tablet       . simvastatin (ZOCOR) 80 MG tablet daily.        No current facility-administered medications for this encounter.    Physical Findings: The patient is in no acute distress. Patient is alert and oriented.  weight is 181 lb 11.2 oz (82.419 kg). His temperature is 98.4 F (36.9 C). His blood pressure is 137/63 and his pulse is 55. His respiration is 20 and oxygen saturation is 96%. .  No oropharyngeal lesions on external exam.  Neck - no SCV or cervical adenopathy.  Voice improved, slightly hoarse. PROCEDURE NOTE: After anesthetizing the nasal cavity, the flexible endoscope was introduced and passed through the nasal cavity.  There is yellowish buildup in the vallecula.  I suspect this is mucous.  No obvious tumor in laryngopharynx  appreciated.  Vocal cords are symmetric, mobile, without lesions.   Lab Findings: No results found for this basename: WBC,  HGB,  HCT,  MCV,  PLT    Lab Results  Component Value Date   TSH 0.967 05/20/2014    Radiographic Findings: No results found.  Impression/Plan:    1) Head and Neck Cancer Status:NED. Needs laryngoscopy and f/u in ENT clinic. Not compliant with scheduling followup with Dr. Redmond Baseman. I will have Gayleen Orem, RN facilitate. There is yellowish buildup in the vallecula.  I suspect this is mucous. Of note, fluconazole was Rx'd several months ago by me for suspected thrush in this area. Would like Dr. Redmond Baseman to inspect this area on laryngoscopy.  Pt does c/o post nasal drip.  2) Nutritional Status:  No issues   3) Risk Factors: The patient has been educated about risk factors including alcohol and tobacco abuse; they understand that avoidance of alcohol and tobacco is important to prevent recurrences as well as other cancers - continues to smoke. The patient continues to use tobacco. The patient was counseled to stop using tobacco and was offered pharmacotherapy and further counseling to help with this. The patient declined pharmacotherapy and further counseling at this time. Therefore, I recommended E- cigarettes instead of "smoking."   4) Swallowing: Good   5) Energy: Good -normal TSH   6) Social: No active social issues to address at  this time   7) Follow-up in 6 months. The patient was encouraged to call with any issues or questions before then. Referal back to ENT for followup in interim.   _____________________________________   Eppie Gibson, MD

## 2014-05-20 NOTE — Progress Notes (Addendum)
Patient here for routine yearly follow up completion of radiation to glottis 01/20/2013.Denies pain.states he has just been having a dripping nose for several months and occasional crack in voice.No swallowing problems.Weight maintained.Patient hasn't been for follow up with ENT.Patient had a basal cell removed from forehead approximately 6 to 8 weeks ago at at [facility].

## 2014-05-21 ENCOUNTER — Telehealth: Payer: Self-pay | Admitting: *Deleted

## 2014-05-21 ENCOUNTER — Encounter: Payer: Self-pay | Admitting: *Deleted

## 2014-05-21 NOTE — Progress Notes (Signed)
Called and left voice mail for Marcus Collier informing him  that his Thyroid level drawn on 05/20/14 was WNL.

## 2014-05-21 NOTE — Telephone Encounter (Signed)
Per Dr. Pearlie Oyster direction, called [facility] ENT to arrange follow-up appt.  Patient care has been transferred from Dr. Redmond Baseman to Dr. Constance Holster.  Appt tentatively schedule for week of 6/22 pending confirmation with patient.  I faxed Dr. Pearlie Oyster 05/20/14 note per scheduler Gerald Stabs' request; fax confirmation received.  Gayleen Orem, RN, BSN, [facility] Head & Neck Oncology Navigator [phone]

## 2014-11-13 ENCOUNTER — Ambulatory Visit
Admission: RE | Admit: 2014-11-13 | Discharge: 2014-11-13 | Disposition: A | Discharge status: Home / Self Care | Payer: Medicare Other | Source: Ambulatory Visit | Attending: Radiation Oncology | Admitting: Radiation Oncology

## 2014-11-13 ENCOUNTER — Ambulatory Visit: Payer: Medicare Other | Admitting: Radiation Oncology

## 2014-11-13 ENCOUNTER — Encounter: Payer: Self-pay | Admitting: Radiation Oncology

## 2014-11-13 ENCOUNTER — Ambulatory Visit: Payer: Medicare Other

## 2014-11-13 VITALS — BP 156/73 | HR 54 | Temp 97.8°F | Ht 69.0 in | Wt 182.2 lb

## 2014-11-13 DIAGNOSIS — R5383 Other fatigue: Principal | ICD-10-CM

## 2014-11-13 DIAGNOSIS — R5381 Other malaise: Secondary | ICD-10-CM

## 2014-11-13 DIAGNOSIS — C320 Malignant neoplasm of glottis: Secondary | ICD-10-CM

## 2014-11-13 NOTE — Progress Notes (Signed)
Mr. Rosiles is here for reassessment s/p radiation for glottic cancer.  He reports no pain on swallowing and is eating anything that he wants.  He has not seen by Dr. Redmond Baseman since 06/2013 since [place].  No other voiced concerns

## 2014-11-13 NOTE — Progress Notes (Addendum)
  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 He still has not seen his ENT doctor despite my recommendations.   He reports he has not seen by Dr. Redmond Baseman since 06/2013 since he was out of the country x 5 weeks ([place]). No trouble swallowing, no new pain.  Voice less hoarse. Reports only 5 cigarettes in past year.  ALLERGIES:  is allergic to penicillins.  Meds: Current Outpatient Prescriptions  Medication Sig Dispense Refill  . amLODipine (NORVASC) 5 MG tablet Take 5 mg by mouth daily.    Marland Kitchen aspirin 325 MG tablet Take 325 mg by mouth daily.    . quinapril (ACCUPRIL) 40 MG tablet     . simvastatin (ZOCOR) 80 MG tablet daily.     . carvedilol (COREG) 25 MG tablet 25 mg 2 (two) times daily with a meal.      No current facility-administered medications for this encounter.    Physical Findings: The patient is in no acute distress. Patient is alert and oriented.  height is 5\' 9"  (1.753 m) and weight is 182 lb 3.2 oz (82.645 kg). His temperature is 97.8 F (36.6 C). His blood pressure is 156/73 and his pulse is 54. Marland Kitchen  No oropharyngeal lesions on external exam.  Neck - no SCV or cervical adenopathy.  Voice improved, slightly hoarse.  Laryngoscopy declined by patient.  Lab Findings:  No results found for: WBC  Lab Results  Component Value Date   TSH 0.967 05/20/2014    Radiographic Findings: No results found.  Impression/Plan:    1) Head and Neck Cancer Status: NED.  Needs laryngoscopy and f/u in ENT clinic. Not compliant with scheduling followup with Dr. Redmond Baseman.  He  declined laryngoscopy today despite my recommendations.  Patient said he will call ENT to schedule.  I am not sure he will comply.  I gave him Dr Redmond Baseman' phone number.  2) Nutritional Status:  No issues   3) Risk Factors: The patient has been educated about risk factors including alcohol and tobacco abuse; they understand that avoidance of alcohol and tobacco is important to prevent recurrences as well as other cancers - continues to smoke rarely. Improvement applauded.   The patient has been counseled to stop using tobacco entirely.  4) Swallowing: Good   5) Energy: Good - re-checking TSH today. (at time of writing note, it is not clear if he showed to the lab.  He may not have complied with my recommendations.)  TSH was normal in May.  6) Social: No active social issues to address at this time   7) Follow-up in 12 months. The patient was encouraged to call with any issues or questions before then. Needs laryngoscopy and f/u in ENT clinic. Not compliant with scheduling followup with Dr. Redmond Baseman.  He declined laryngoscopy today despite my recommendations.  Patient said he will call ENT to schedule.  I am not sure he will comply.  I gave him Dr Redmond Baseman' phone number.   _____________________________________   Eppie Gibson, MD

## 2015-11-02 NOTE — Progress Notes (Signed)
Marcus Collier presents for follow up of radiation to his Glottis completed 01/20/2013  Pain issues, if any: No Using a feeding tube?: No Weight changes, if any:  Wt Readings from Last 3 Encounters:  11/03/15 181 lb 4.8 oz (82.237 kg)  11/13/14 182 lb 3.2 oz (82.645 kg)  05/20/14 181 lb 11.2 oz (82.419 kg)   Swallowing issues, if any: He denies any swallowing difficulties, he is not modifying his diet.  Smoking or chewing tobacco? No Using fluoride trays daily? No Last ENT visit was on: He saw the ENT six months ago, he is not sure the exact date.   Other notable issues, if any: He complains of a dry throat. He states when he gets up in the morning his voice is hoarse, but improves as the day goes on. The ENT gave him saline to use, because he thinks it is a nasal problem, but Marcus Collier states he often forgets to use it. He is planning to get a humidifier to use this winter.

## 2015-11-03 ENCOUNTER — Ambulatory Visit
Admission: RE | Admit: 2015-11-03 | Discharge: 2015-11-03 | Disposition: A | Discharge status: Home / Self Care | Payer: Medicare Other | Source: Ambulatory Visit | Attending: Radiation Oncology | Admitting: Radiation Oncology

## 2015-11-03 ENCOUNTER — Encounter: Payer: Self-pay | Admitting: Adult Health

## 2015-11-03 ENCOUNTER — Ambulatory Visit (HOSPITAL_BASED_OUTPATIENT_CLINIC_OR_DEPARTMENT_OTHER)
Admission: RE | Admit: 2015-11-03 | Discharge: 2015-11-03 | Disposition: A | Discharge status: Home / Self Care | Payer: Medicare Other | Source: Ambulatory Visit | Attending: Radiation Oncology | Admitting: Radiation Oncology

## 2015-11-03 ENCOUNTER — Encounter: Payer: Self-pay | Admitting: Radiation Oncology

## 2015-11-03 VITALS — BP 169/86 | HR 54 | Temp 97.8°F | Ht 69.0 in | Wt 181.3 lb

## 2015-11-03 DIAGNOSIS — R5381 Other malaise: Secondary | ICD-10-CM

## 2015-11-03 DIAGNOSIS — R5383 Other fatigue: Secondary | ICD-10-CM | POA: Diagnosis not present

## 2015-11-03 DIAGNOSIS — C320 Malignant neoplasm of glottis: Secondary | ICD-10-CM | POA: Insufficient documentation

## 2015-11-03 LAB — TSH CHCC: TSH: 0.802 m[IU]/L (ref 0.320–4.118)

## 2015-11-03 MED ORDER — LARYNGOSCOPY SOLUTION RAD-ONC
15.0000 mL | Freq: Once | TOPICAL | Status: AC
Start: 2015-11-03 — End: 2015-11-03
  Administered 2015-11-03: 15 mL via TOPICAL
  Filled 2015-11-03: qty 15

## 2015-11-03 NOTE — Progress Notes (Signed)
I briefly met Mr. Hicklin today during his radiation oncology visit with Dr. Isidore Moos. Mr. Klippel completed radiation therapy for cancer of the glottis on 01/20/13.  Laryngoscopy was performed today by Dr. Isidore Moos, who recommended that the patient return to his ENT physician for further evaluation of voice changes and vocal cord inflammation.  Dr. Isidore Moos also spoke with the patient about having subsequent visits alternating with myself for continued surveillance and follow-up care. Mr. Slaven stated that he was "done with coming to the cancer center."   I did discuss with him the role of survivorship and my role in his post-treatment cancer care. I gave him a copy of the "Life After Cancer for Every Survivor" booklet, along with my business card and encouraged him to call me with any questions or concerns.   He would not allow me to make a follow-up appointment with me for 1 year from now.  He did agree to allow me to call him n 6 months to assess whether he would be willing to see me in 1 year (10/2016), as requested by Dr. Isidore Moos. He will need a TSH in 10/2016, if he allows me to schedule appt.  No orders or appts have been placed today as requested by the patient.   I encouraged him to call me with any concerns and we could certainly see him sooner, if needed. I look forward to potentially participating in his care.   Mike Craze, NP [place] [phone]

## 2015-11-03 NOTE — Progress Notes (Signed)
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 He denies any pain. He denies any new lumps or bumps. Does not have a feeding tube in place. Weight is stable. He denies any swallowing difficulties, he is not modifying his diet. He denies smoking or chewing tobacco use - though he smells of tobacco today. He doesn't remember when he quit smoking. He does not use fluoride trays daily. He last saw the ENT six months ago, he is not sure the exact date. He denies hemoptysis or acid reflux. Prefers Marcus Collier on [employer].  He complains of a dry throat. He states when he gets up in the morning his voice is more hoarse than previous, but improves as the day goes on. The ENT gave him saline to use, because he thinks it is a nasal problem, but Marcus Collier states he often forgets to use it. He is planning to get a humidifier to use this winter.     ALLERGIES:  is allergic to penicillins.  Meds: Current Outpatient Prescriptions  Medication Sig Dispense Refill  . amLODipine (NORVASC) 5 MG tablet Take 5 mg by mouth daily.    Marland Kitchen aspirin 325 MG tablet Take 325 mg by mouth daily.    . carvedilol (COREG) 25 MG tablet 25 mg 2 (two) times daily with a meal.     . quinapril (ACCUPRIL) 40 MG tablet     . simvastatin (ZOCOR) 80 MG tablet daily.      No current facility-administered medications for this encounter.    Physical Findings: The patient is in no acute distress. Patient is alert and oriented.  height is 5\' 9"  (1.753 m) and weight is 181 lb  4.8 oz (82.237 kg). His temperature is 97.8 F (36.6 C). His blood pressure is 169/86 and his pulse is 54. .   Mouth: No lesions in the oropharynx. Patient refuses to remove his upper dentures.  Neck: No palpable cervical or supraclavicular lymphadenopathy.  Heart: Regular in rate and rhythm with no murmur, rubs or gallops. Lungs: Clear to auscultation with no wheezes or rales.  PROCEDURE NOTE: After anesthetizing the nasal cavity with topical lidocaine and phenylephrine, the flexible endoscope was introduced and passed through the nasal cavity. FINDINGS: True Vocal cords are mobile but have some blood on them, look irritated. There is also blood spotting in the supraglottis.   Lab Findings:  No results found for: WBC  Lab Results  Component Value Date   TSH 0.802 11/03/2015    Radiographic Findings: No results found.  Impression/Plan:    1) Head and Neck Cancer Status: More hoarse.  Vocal cords appear erythematous and bloody, increased hoarseness, refer directly back to ENT. Patient desires to see the ENT doctor he saw most recently, though he does not remember their name.   2) Nutritional Status: no issues  3) Risk Factors: The patient has been educated about risk factors including alcohol and tobacco abuse; they understand that avoidance of alcohol and tobacco is important to prevent recurrences as well as other cancers. Abstaining from  tobacco, he reports.  4) Swallowing: no major issues  5) thyroid function normal:  Lab Results  Component Value Date   TSH 0.802 11/03/2015    6) Social: No active social issues to address at this time    7) . Refer back to ENT as above. The patient was encouraged to call with any issues or questions before then. Recommended that he follow up in one year with Marcus Craze, NP of ENT survivorship. She will call him to arrange appointment.  _____________________________________   Eppie Gibson, MD   This document serves as a record  of services personally performed by Eppie Gibson, MD. It was created on her behalf by Arlyce Harman, a trained medical scribe. The creation of this record is based on the scribe's personal observations and the provider's statements to them. This document has been checked and approved by the attending provider.

## 2015-11-05 ENCOUNTER — Ambulatory Visit: Payer: Medicare Other | Attending: Radiation Oncology

## 2015-11-05 ENCOUNTER — Ambulatory Visit: Payer: Medicare Other

## 2015-11-05 ENCOUNTER — Ambulatory Visit: Payer: Medicare Other | Admitting: Radiation Oncology

## 2015-11-05 ENCOUNTER — Telehealth: Payer: Self-pay | Admitting: *Deleted

## 2015-11-05 ENCOUNTER — Inpatient Hospital Stay: Admission: RE | Admit: 2015-11-05 | Payer: Medicare Other | Source: Ambulatory Visit

## 2015-11-05 NOTE — Telephone Encounter (Signed)
  Oncology Nurse Navigator Documentation   Navigator Encounter Type: Telephone (11/05/15 1441)         Interventions: Coordination of Care (11/05/15 1441)      Per Dr. Pearlie Oyster guidance, called [facility] ENT to arrange follow-up visit. Spoke with Janett Billow, requested that patient be contacted and appt arranged to see Dr. Constance Holster within next 2 weeks to assess blood on vocal chords, unknown etiology.   She verbalized understanding.  Gayleen Orem, RN, BSN, [place] [facility] [place] [phone]          Time Spent with Patient: 15 (11/05/15 1441)

## 2015-11-29 ENCOUNTER — Telehealth: Payer: Self-pay | Admitting: Adult Health

## 2015-11-29 NOTE — Telephone Encounter (Signed)
I received a note from Romie Jumper that Ms. Taubman (patient's wife) called and was concerned about her husband needing a biopsy and wanted to get additional information.  She stated that her husband has difficulty hearing sometimes and just wanted to make sure he was getting the procedure done that he needed.    I let her know that, per Dr. Pearlie Oyster note on 11/03/15, that she recommended that Mr. Bommarito get back in to see Dr. Constance Holster [facility]) to further evaluate blood that was visualized on the patient's vocal chords during laryngoscopy.  Per Mrs. Sabra Heck, they were not contacted by Dr. Janeice Robinson office for this appointment.    I gave the Mrs. Sabra Heck the phone number for [facility] and encouraged her to make that follow-up appt with Dr. Constance Holster.  She expressed verbal understanding.  I encouraged her to call us back with any additional questions or concerns and she agreed.    I also let Mrs. Kleinsasser know that we would like Mr. Mcanany to see me once per year and Dr. Constance Holster once per year (6 months a part) for continued surveillance.  Mr. Alfieri was resistant to coming back to the cancer center when we met in early November, but he did agree to allow me to follow-up with him in the summer to negotiate a follow-up visit with me at that time.    I will forward this message to Gayleen Orem, RN-Nurse Navigator to make him aware.    Mike Craze, NP [place] [phone]

## 2019-04-15 ENCOUNTER — Emergency Department (HOSPITAL_COMMUNITY): Payer: Medicare Other

## 2019-04-15 ENCOUNTER — Encounter (HOSPITAL_COMMUNITY): Payer: Self-pay

## 2019-04-15 ENCOUNTER — Other Ambulatory Visit: Payer: Self-pay

## 2019-04-15 ENCOUNTER — Emergency Department (HOSPITAL_COMMUNITY)
Admission: EM | Admit: 2019-04-15 | Discharge: 2019-04-15 | Disposition: A | Discharge status: Home / Self Care | Payer: Medicare Other | Attending: Emergency Medicine | Admitting: Emergency Medicine

## 2019-04-15 DIAGNOSIS — R55 Syncope and collapse: Secondary | ICD-10-CM | POA: Diagnosis present

## 2019-04-15 DIAGNOSIS — S0101XA Laceration without foreign body of scalp, initial encounter: Secondary | ICD-10-CM | POA: Diagnosis not present

## 2019-04-15 DIAGNOSIS — Z7982 Long term (current) use of aspirin: Secondary | ICD-10-CM | POA: Insufficient documentation

## 2019-04-15 DIAGNOSIS — Y92007 Garden or yard of unspecified non-institutional (private) residence as the place of occurrence of the external cause: Secondary | ICD-10-CM | POA: Diagnosis not present

## 2019-04-15 DIAGNOSIS — Y939 Activity, unspecified: Secondary | ICD-10-CM | POA: Diagnosis not present

## 2019-04-15 DIAGNOSIS — Z79899 Other long term (current) drug therapy: Secondary | ICD-10-CM | POA: Diagnosis not present

## 2019-04-15 DIAGNOSIS — I10 Essential (primary) hypertension: Secondary | ICD-10-CM | POA: Diagnosis not present

## 2019-04-15 DIAGNOSIS — Z8521 Personal history of malignant neoplasm of larynx: Secondary | ICD-10-CM | POA: Insufficient documentation

## 2019-04-15 DIAGNOSIS — W19XXXA Unspecified fall, initial encounter: Secondary | ICD-10-CM | POA: Insufficient documentation

## 2019-04-15 DIAGNOSIS — Y999 Unspecified external cause status: Secondary | ICD-10-CM | POA: Insufficient documentation

## 2019-04-15 DIAGNOSIS — Z23 Encounter for immunization: Secondary | ICD-10-CM | POA: Insufficient documentation

## 2019-04-15 DIAGNOSIS — Z87891 Personal history of nicotine dependence: Secondary | ICD-10-CM | POA: Diagnosis not present

## 2019-04-15 LAB — CBC WITH DIFFERENTIAL/PLATELET
Abs Immature Granulocytes: 0.05 10*3/uL (ref 0.00–0.07)
Basophils Absolute: 0 10*3/uL (ref 0.0–0.1)
Basophils Relative: 0 %
Eosinophils Absolute: 0.4 10*3/uL (ref 0.0–0.5)
Eosinophils Relative: 3 %
HCT: 40 % (ref 39.0–52.0)
Hemoglobin: 13.5 g/dL (ref 13.0–17.0)
Immature Granulocytes: 0 %
Lymphocytes Relative: 16 %
Lymphs Abs: 1.9 10*3/uL (ref 0.7–4.0)
MCH: 33.1 pg (ref 26.0–34.0)
MCHC: 33.8 g/dL (ref 30.0–36.0)
MCV: 98 fL (ref 80.0–100.0)
Monocytes Absolute: 0.7 10*3/uL (ref 0.1–1.0)
Monocytes Relative: 6 %
Neutro Abs: 8.8 10*3/uL — ABNORMAL HIGH (ref 1.7–7.7)
Neutrophils Relative %: 75 %
Platelets: 159 10*3/uL (ref 150–400)
RBC: 4.08 MIL/uL — ABNORMAL LOW (ref 4.22–5.81)
RDW: 11.8 % (ref 11.5–15.5)
WBC: 11.9 10*3/uL — ABNORMAL HIGH (ref 4.0–10.5)
nRBC: 0 % (ref 0.0–0.2)

## 2019-04-15 LAB — BASIC METABOLIC PANEL
Anion gap: 10 (ref 5–15)
BUN: 18 mg/dL (ref 8–23)
CO2: 22 mmol/L (ref 22–32)
Calcium: 9.1 mg/dL (ref 8.9–10.3)
Chloride: 108 mmol/L (ref 98–111)
Creatinine, Ser: 1.1 mg/dL (ref 0.61–1.24)
GFR calc Af Amer: >60 mL/min (ref >60)
GFR calc non Af Amer: >60 mL/min (ref >60)
Glucose, Bld: 111 mg/dL — ABNORMAL HIGH (ref 70–99)
Potassium: 4.1 mmol/L (ref 3.5–5.1)
Sodium: 140 mmol/L (ref 135–145)

## 2019-04-15 LAB — URINALYSIS, ROUTINE W REFLEX MICROSCOPIC
Bilirubin Urine: NEGATIVE
Glucose, UA: NEGATIVE mg/dL
Hgb urine dipstick: NEGATIVE
Ketones, ur: 5 mg/dL — AB
Leukocytes,Ua: NEGATIVE
Nitrite: NEGATIVE
Protein, ur: NEGATIVE mg/dL
Specific Gravity, Urine: 1.02 (ref 1.005–1.030)
pH: 5 (ref 5.0–8.0)

## 2019-04-15 LAB — TROPONIN I: Troponin I: <0.03 ng/mL (ref <0.03)

## 2019-04-15 MED ORDER — SODIUM CHLORIDE 0.9 % IV BOLUS
500.0000 mL | Freq: Once | INTRAVENOUS | Status: AC
  Administered 2019-04-15: 500 mL via INTRAVENOUS

## 2019-04-15 MED ORDER — TETANUS-DIPHTH-ACELL PERTUSSIS 5-2.5-18.5 LF-MCG/0.5 IM SUSP
0.5000 mL | Freq: Once | INTRAMUSCULAR | Status: AC
  Administered 2019-04-15: 15:00:00 0.5 mL via INTRAMUSCULAR
  Filled 2019-04-15: qty 0.5

## 2019-04-15 NOTE — ED Provider Notes (Signed)
[facility] EMERGENCY DEPARTMENT Provider Note   CSN: [id number] Arrival date & time: 04/15/19  1242    History   Chief Complaint Chief Complaint  Patient presents with  . Loss of Consciousness    HPI Marcus Collier is a 73 y.o. 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  Patient reports he was standing in the yard and began to feel "weird" so he went to sit down, but fell and hit his head.  He reports he passed out before he hit his head.  He has a small laceration on the left posterior scalp.  His tetanus is not up-to-date.  He reports he feels back to normal now.  He reports this is never happened before.  He reports eating only cereal, coffee and milk today.  He reports after this happened, he started drinking some water which made him feel better.  He denies any fever, chest pain, shortness of breath, abdominal pain, nausea, vomiting, neck or back pain.     HPI  Past Medical History:  Diagnosis Date  . 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 Hoarseness of voice   . Hypertension   . 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 S/P radiation therapy 12/10/2012 - 01/20/2013   Vocal Cords/ 63 Gray/ 28 Fractions  . Unspecified malignant neoplasm of skin, site unspecified     Patient Active Problem List   Diagnosis Date Noted  . 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 Lesion of vocal cord 11/26/2012  . 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 Biopsy of Vocal cord  11/15/12   Right and Left  . Biospy mouth  11/15/12  . coronary artery surgery          Home Medications    Prior to Admission medications   Medication Sig Start Date End Date Taking? Authorizing Provider  amLODipine (NORVASC) 5 MG tablet Take 5 mg by mouth  daily.    [provider]  aspirin 325 MG tablet Take 325 mg by mouth daily.    [provider]  carvedilol (COREG) 25 MG tablet 25 mg 2 (two) times daily with a meal.  10/17/12   [provider]  quinapril (ACCUPRIL) 40 MG tablet  10/09/12   [provider]  simvastatin (ZOCOR) 80 MG tablet daily.  09/02/12   [provider]    Family History Family History  Problem Relation Age of Onset  . Heart attack Mother   . Heart attack Father   . Hypertension Father     Social History Social History   Tobacco Use  . Smoking status: Former Smoker    Packs/day: 1.00    Years: 52.00    Pack years: 52.00    Types: Cigarettes    Last attempt to quit: 11/26/2002    Years since quitting: 16.3  Substance Use Topics  . Alcohol use: Yes    Comment: social drinker  . Drug use: Not on file     Allergies   Penicillins   Review of Systems Review of Systems  Constitutional: Negative for chills and fever.  HENT: Negative for facial swelling and sore throat.   Respiratory: Negative for shortness of breath.   Cardiovascular: Negative for chest pain.  Gastrointestinal:  Negative for abdominal pain, nausea and vomiting.  Genitourinary: Negative for dysuria.  Musculoskeletal: Negative for back pain.  Skin: Positive for wound. Negative for rash.  Neurological: Positive for syncope. Negative for headaches.  Psychiatric/Behavioral: The patient is not nervous/anxious.      Physical Exam Updated Vital Signs BP (!) 162/59   Pulse (!) 50   Temp (!) 97.5 F (36.4 C) (Oral)   Resp 17   Ht 5\' 8"  (1.727 m)   Wt 83.9 kg   SpO2 100%   BMI 28.13 kg/m   Physical Exam Vitals signs and nursing note reviewed.  Constitutional:      General: He is not in acute distress.    Appearance: He is well-developed. He is not diaphoretic.  HENT:     Head: Normocephalic and atraumatic.     Mouth/Throat:     Pharynx: No oropharyngeal exudate.  Eyes:     General:  No scleral icterus.       Right eye: No discharge.        Left eye: No discharge.     Conjunctiva/sclera: Conjunctivae normal.     Pupils: Pupils are equal, round, and reactive to light.  Neck:     Musculoskeletal: Normal range of motion and neck supple.     Thyroid: No thyromegaly.  Cardiovascular:     Rate and Rhythm: Normal rate and regular rhythm.     Heart sounds: Normal heart sounds. No murmur. No friction rub. No gallop.   Pulmonary:     Effort: Pulmonary effort is normal. No respiratory distress.     Breath sounds: Normal breath sounds. No stridor. No wheezing or rales.  Abdominal:     General: Bowel sounds are normal. There is no distension.     Palpations: Abdomen is soft.     Tenderness: There is no abdominal tenderness. There is no guarding or rebound.  Lymphadenopathy:     Cervical: No cervical adenopathy.  Skin:    General: Skin is warm and dry.     Coloration: Skin is not pale.     Findings: No rash.     Comments: 1 cm linear laceration to L posterior scalp; no active bleeding  Neurological:     Mental Status: He is alert.     Coordination: Coordination normal.     Comments: CN 3-12 intact; normal sensation throughout; 5/5 strength in all 4 extremities; equal bilateral grip strength      ED Treatments / Results  Labs (all labs ordered are listed, but only abnormal results are displayed) Labs Reviewed  BASIC METABOLIC PANEL - Abnormal; Notable for the following components:      Result Value   Glucose, Bld 111 (*)    All other components within normal limits  CBC WITH DIFFERENTIAL/PLATELET - Abnormal; Notable for the following components:   WBC 11.9 (*)    RBC 4.08 (*)    Neutro Abs 8.8 (*)    All other components within normal limits  URINALYSIS, ROUTINE W REFLEX MICROSCOPIC - Abnormal; Notable for the following components:   APPearance HAZY (*)    Ketones, ur 5 (*)    All other components within normal limits  TROPONIN I    EKG EKG Interpretation   Date/Time:  Tuesday April 15 2019 12:50:07 EDT Ventricular Rate:  62 PR Interval:    QRS Duration: 113 QT Interval:  421 QTC Calculation: 428 R Axis:   51 Text Interpretation:  Sinus rhythm Probable inferior infarct, old Abnormal lateral Q waves  Confirmed by Quintella Reichert [phone]) on 04/15/2019 12:53:11 PM Also confirmed by Quintella Reichert [phone]), editor Philomena Doheny [phone])  on 04/15/2019 3:24:51 PM   Radiology Ct Head Wo Contrast  Result Date: 04/15/2019 CLINICAL DATA:  Syncopal episode after sitting down and hit the back of the head EXAM: CT HEAD WITHOUT CONTRAST TECHNIQUE: Contiguous axial images were obtained from the base of the skull through the vertex without intravenous contrast. COMPARISON:  None. FINDINGS: Brain: No evidence of acute infarction, hemorrhage, extra-axial collection, ventriculomegaly, or mass effect. Generalized cerebral atrophy. Periventricular white matter low attenuation likely secondary to microangiopathy. Vascular: Cerebrovascular atherosclerotic calcifications are noted. Skull: Negative for fracture or focal lesion. Sinuses/Orbits: Visualized portions of the orbits are unremarkable. Mastoid sinuses are clear. Right sphenoid sinus mucosal thickening. Other: None. IMPRESSION: 1. No acute intracranial pathology. 2. Chronic microvascular disease and cerebral atrophy. Electronically Signed   By: Kathreen Devoid   On: 04/15/2019 14:18    Procedures .Marland KitchenLaceration Repair Date/Time: 04/15/2019 3:36 PM Performed by: Frederica Kuster, PA-C Authorized by: Frederica Kuster, PA-C   Consent:    Consent obtained:  Verbal   Consent given by:  Patient   Risks discussed:  Infection, pain, poor cosmetic result and poor wound healing   Alternatives discussed:  No treatment Anesthesia (see MAR for exact dosages):    Anesthesia method:  None Laceration details:    Location:  Scalp   Scalp location:  L parietal   Length (cm):  1 Repair type:    Repair type:  Simple  Pre-procedure details:    Preparation:  Patient was prepped and draped in usual sterile fashion and imaging obtained to evaluate for foreign bodies Exploration:    Hemostasis achieved with:  Direct pressure   Wound exploration: wound explored through full range of motion and entire depth of wound probed and visualized     Wound extent: no foreign bodies/material noted, no muscle damage noted and no underlying fracture noted     Contaminated: no   Treatment:    Area cleansed with:  Saline   Amount of cleaning:  Standard   Irrigation solution:  Tap water   Irrigation volume:  50   Visualized foreign bodies/material removed: no   Skin repair:    Repair method:  Tissue adhesive Approximation:    Approximation:  Close Post-procedure details:    Dressing:  Open (no dressing)   Patient tolerance of procedure:  Tolerated well, no immediate complications   (including critical care time)  Medications Ordered in ED Medications  Tdap (BOOSTRIX) injection 0.5 mL (0.5 mLs Intramuscular Given 04/15/19 1515)  sodium chloride 0.9 % bolus 500 mL (0 mLs Intravenous Stopped 04/15/19 1605)     Initial Impression / Assessment and Plan / ED Course  I have reviewed the triage vital signs and the nursing notes.  Pertinent labs & imaging results that were available during my care of the patient were reviewed by me and considered in my medical decision making (see chart for details).        Patient presenting after episode of suspected vasovagal syncope.  Patient did not have much to drink today.  IV fluids given.  Orthostatics are stable.  Labs are unremarkable except for nonspecific mild leukocytosis at 11.9.  UA shows 5 ketones.  CT head is negative for acute findings.  I recommended a single staple, however patient declined and said he would rather just leave it alone or use glue.  Dermabond applied with good outcome.  Wound care  discussed.  Patient found to have some episodes of bradycardia down to  47.  Will stop carvedilol at this time, as this could be a cause of patient's syncope today.  Patient seemed to be asymptomatic with these bradycardic episodes, however will stop until patient speaks with his PCP.  I recommended calling them tomorrow.  Patient advised to monitor his heart rate at home.  Return precautions discussed.  Patient understands and agrees with plan.  Patient vitals stable throughout ED course and discharged in satisfactory condition. Patient also evaluated by my attending, Dr. Ralene Bathe, who guided the patient's management and agrees with plan.  Final Clinical Impressions(s) / ED Diagnoses   Final diagnoses:  Syncope and collapse  Laceration of scalp, initial encounter    ED Discharge Orders    None       Frederica Kuster, PA-C 04/15/19 1643    Quintella Reichert, MD 04/18/19 1008

## 2019-04-15 NOTE — ED Notes (Signed)
Pt refused to sign discharge 

## 2019-04-15 NOTE — ED Notes (Signed)
Pt states he needs to void. Offered urinal. Insistent that hwe be disconnected. Same done.

## 2019-04-15 NOTE — Discharge Instructions (Addendum)
Make sure to drink plenty of water throughout the day.  Do not get the glued area wet for 24 hours.  After this, do not submerge for 1 week.  Do not apply antibiotic ointment on it.  If you develop any increasing pain, redness, swelling, drainage from the area, please return to the emergency department.  Please return to the emergency department if you develop any new or worsening symptoms.  I recommend following up with your doctor about today's episode.  We did have some decrease in your heart rate to about 47 bpm.  Please stop your Coreg for right now and talk to your doctor before restarting it.

## 2019-04-15 NOTE — ED Triage Notes (Signed)
Pt arrives via [facility], syncopal episode after sitting down hit back of head, will look at PTA meds to see if on thinners. [facility] assisted pt to BR before bringing him in, had syncopal episode again. Pt's heart rate in 40's, given atropine, HR went up to 60's. 60's on arrival. AOx4 on arrival. No cough, SOB, fever

## 2019-04-15 NOTE — ED Notes (Signed)
Patient verbalizes understanding of discharge instructions. Opportunity for questioning and answers were provided. Armband removed by staff, pt discharged from ED.  

## 2020-01-15 ENCOUNTER — Ambulatory Visit: Payer: Medicare Other | Attending: Internal Medicine

## 2020-01-15 DIAGNOSIS — Z23 Encounter for immunization: Secondary | ICD-10-CM | POA: Insufficient documentation

## 2020-01-15 NOTE — Progress Notes (Signed)
   Covid-19 Vaccination Clinic  Name:  Marcus Collier    MRN: [id number] DOB: [birth year]  01/15/2020  [name] was observed post Covid-19 immunization for 15 minutes without incidence. He was provided with Vaccine Information Sheet and instruction to access the V-Safe system.   Mr. Doctor was instructed to call 911 with any severe reactions post vaccine: Marland Kitchen Difficulty breathing  . Swelling of your face and throat  . A fast heartbeat  . A bad rash all over your body  . Dizziness and weakness    Immunizations Administered    Name Date Dose VIS Date Route   Pfizer COVID-19 Vaccine 01/15/2020  5:53 PM 0.3 mL 12/05/2019 Intramuscular   Manufacturer: [place]   Lot: [id number]   [place]: [id number]

## 2020-02-05 ENCOUNTER — Ambulatory Visit: Payer: Medicare Other | Attending: Internal Medicine

## 2020-02-05 DIAGNOSIS — Z23 Encounter for immunization: Secondary | ICD-10-CM | POA: Insufficient documentation

## 2020-02-05 NOTE — Progress Notes (Signed)
   Covid-19 Vaccination Clinic  Name:  Marcus Collier    MRN: [id number] DOB: 07-Jun-1946  02/05/2020  [name] was observed post Covid-19 immunization for 15 minutes without incidence. He was provided with Vaccine Information Sheet and instruction to access the V-Safe system.   [name] was instructed to call 911 with any severe reactions post vaccine: Marland Kitchen Difficulty breathing  . Swelling of your face and throat  . A fast heartbeat  . A bad rash all over your body  . Dizziness and weakness    Immunizations Administered    Name Date Dose VIS Date Route   Pfizer COVID-19 Vaccine 02/05/2020  9:17 AM 0.3 mL 12/05/2019 Intramuscular   Manufacturer: [place]   Lot: [place] [id number]   [place]: [id number]

## 2020-07-27 ENCOUNTER — Other Ambulatory Visit: Payer: Self-pay | Admitting: Otolaryngology

## 2020-07-27 DIAGNOSIS — Z8521 Personal history of malignant neoplasm of larynx: Secondary | ICD-10-CM

## 2020-08-05 ENCOUNTER — Other Ambulatory Visit: Payer: Self-pay | Admitting: Otolaryngology

## 2020-08-05 ENCOUNTER — Ambulatory Visit
Admission: RE | Admit: 2020-08-05 | Discharge: 2020-08-05 | Disposition: A | Discharge status: Home / Self Care | Payer: Medicare Other | Source: Ambulatory Visit | Attending: Otolaryngology | Admitting: Otolaryngology

## 2020-08-05 DIAGNOSIS — Z8521 Personal history of malignant neoplasm of larynx: Secondary | ICD-10-CM

## 2020-08-05 MED ORDER — IOPAMIDOL (ISOVUE-300) INJECTION 61%
75.0000 mL | Freq: Once | INTRAVENOUS | Status: AC | PRN
  Administered 2020-08-05: 75 mL via INTRAVENOUS

## 2020-08-09 ENCOUNTER — Other Ambulatory Visit (HOSPITAL_COMMUNITY)
Admission: RE | Admit: 2020-08-09 | Discharge: 2020-08-09 | Disposition: A | Discharge status: Home / Self Care | Payer: Medicare Other | Source: Ambulatory Visit | Attending: Otolaryngology | Admitting: Otolaryngology

## 2020-08-09 DIAGNOSIS — Z01812 Encounter for preprocedural laboratory examination: Secondary | ICD-10-CM | POA: Insufficient documentation

## 2020-08-09 DIAGNOSIS — Z20822 Contact with and (suspected) exposure to covid-19: Secondary | ICD-10-CM | POA: Insufficient documentation

## 2020-08-09 LAB — SARS CORONAVIRUS 2 (TAT 6-24 HRS): SARS Coronavirus 2: NEGATIVE

## 2020-08-10 ENCOUNTER — Other Ambulatory Visit: Payer: Self-pay

## 2020-08-10 ENCOUNTER — Encounter (HOSPITAL_COMMUNITY): Payer: Self-pay | Admitting: Otolaryngology

## 2020-08-10 NOTE — H&P (Signed)
HPI:   Marcus Collier is a 74 y.o. male who presents as a consult Patient.   Referring Provider: Cleotis Nipper*  Chief complaint: Ear pain, sore throat, hoarseness.  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 He has continued to smoke until about 2 years ago. He drinks periodically. He lost his voice about 6 months ago. In the past 2 months he has developed right ear pain and sore throat. He has no trouble swallowing or breathing.  PMH/Meds/All/SocHx/FamHx/ROS:   Past Medical History:  Diagnosis Date  . Allergy  . History of laryngeal cancer  . Hoarseness  . Hypercholesteremia  . Hypertension  . Malignant neoplasm [facility])   Past Surgical History:  Procedure Laterality Date  . coronary artery surgery  . direct laryngnoscopy   No family history of bleeding disorders, wound healing problems or difficulty with anesthesia.   Social History   Socioeconomic History  . Marital status: Married  Spouse name: Not on file  . Number of children: Not on file  . Years of education: Not on file  . Highest education level: Not on file  Occupational History  . Not on file  Tobacco Use  . Smoking status: Former Research scientist (life sciences)  . Smokeless tobacco: Never Used  Vaping Use  . Vaping Use: Never used  Substance and Sexual Activity  . Alcohol use: Not on file  . Drug use: Not on file  . Sexual activity: Not on file  Other Topics Concern  . Not on file  Social History Narrative  . Not on file   Social Determinants of Health   Financial Resource Strain:  . Difficulty of Paying Living Expenses:  Food Insecurity:  . Worried About Charity fundraiser in the Last Year:  . Arboriculturist in the Last Year:  Transportation Needs:  . Film/video editor (Medical):  Marland Kitchen Lack of Transportation (Non-Medical):  Physical Activity:  . Days of Exercise per Week:  . Minutes of Exercise per Session:  Stress:  . Feeling of Stress :  Social Connections:  . Frequency of Communication  with Friends and Family:  . Frequency of Social Gatherings with Friends and Family:  . Attends Religious Services:  . Active Member of Clubs or Organizations:  . Attends Archivist Meetings:  Marland Kitchen Marital Status:   Current Outpatient Medications:  . amLODIPine (NORVASC) 10 MG tablet, TAKE ONE TABLET BY MOUTH EVERY MORNING, Disp: , Rfl:  . aspirin 325 MG tablet *ANTIPLATELET*, Take 325 mg by mouth., Disp: , Rfl:  . atorvastatin (LIPITOR) 80 MG tablet, TAKE ONE TABLET BY MOUTH EVERY EVENING, Disp: , Rfl:  . hydrALAZINE (APRESOLINE) 25 MG tablet, TAKE ONE TABLET BY MOUTH EVERY MORNING and TAKE ONE TABLET BY MOUTH EVERY EVENING, Disp: , Rfl:  . quinapril (ACCUPRIL) 40 MG tablet, , Disp: , Rfl:   A complete ROS was performed with pertinent positives/negatives noted in the HPI. The remainder of the ROS are negative.   Physical Exam:   BP 121/58  Pulse 76  Temp (!) 96.5 F (35.8 C)  Ht 1.753 m (5\' 9" )  Wt 72.1 kg (159 lb)  BMI 23.48 kg/m   General: Healthy and alert, in no distress, breathing easily. Normal affect. In a pleasant mood. He is aphonic. Head: Normocephalic, atraumatic. No masses, or scars. Eyes: Pupils are equal, and reactive to light. Vision is grossly intact. No spontaneous or gaze nystagmus. Ears: Ear canals are clear on the left with impacted cerumen on the  right that was cleaned out under the microscope. Tympanic membranes are intact, with normal landmarks and the middle ears are clear and healthy. Hearing: Grossly diminished. Nose: Nasal cavities are clear with healthy mucosa, no polyps or exudate. Airways are patent. Face: No masses or scars, facial nerve function is symmetric. Oral Cavity: No mucosal abnormalities are noted. Tongue with normal mobility. Maxillary denture in place. Oropharynx: Tonsils are absent. There are no mucosal masses identified. Tongue base appears normal and healthy. Larynx/Hypopharynx: Inadequate mirror exam. Chest: Deferred Neck:  No palpable masses, no cervical adenopathy, no thyroid nodules or enlargement. Neuro: Cranial nerves II-XII with normal function. Balance: Normal gate. Other findings: none.  Independent Review of Additional Tests or Records:  none  Procedures:  Procedure note: Flexible fiberoptic laryngoscopy  Details of the procedure were explained to the patient and all questions were answered.   Procedure:   After anesthetizing the nasal cavity with topical lidocaine and oxymetazoline, the flexible endoscope was introduced and passed through the nasal cavity into the nasopharynx. The scope was then advanced to the level of the oropharynx, then the hypopharynx and larynx. Scope number 10 was used.  Findings:   The posterior soft palate, uvula, tongue base and vallecula were visualized and appeared healthy without mucosal masses or lesions. There is an ulcerative and slightly exophytic lesion involving the entire glottis circumferentially. Airway is restricted but not dangerously so. Supraglottic larynx with edema. Unable to evaluate the subglottic larynx. Piriforms appear clear.  The scope was withdrawn from the nose. He tolerated the procedure well.   Procedure note:  Indications: Cerumen impaction  Details of cerumen removal were discussed with the patient and all questions were answered.  Procedure:  Using the operating microscope, the right side was cleaned of cerumen using suction. There was no signs of infection. Symptoms were releaved.  He tolerated this procedure well. There were no complications.  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 We will get a CT scan of the neck to further evaluate the extent. He is likely going to require total laryngectomy. There is no palpable lymph nodes currently. We will reevaluate and discuss again after the CT scan.

## 2020-08-10 NOTE — Anesthesia Preprocedure Evaluation (Addendum)
Anesthesia Evaluation  Patient identified by MRN, date of birth, ID band Patient awake    Reviewed: Allergy & Precautions, NPO status , Patient's Chart, lab work & pertinent test results  History of Anesthesia Complications Negative for: history of anesthetic complications  Airway Mallampati: I  TM Distance: >3 FB Neck ROM: Full    Dental  (+) Edentulous Upper, Dental Advisory Given   Pulmonary neg pulmonary ROS, former smoker,    Pulmonary exam normal        Cardiovascular hypertension, Pt. on medications + CAD, + Past MI and + Cardiac Stents  Normal cardiovascular exam     Neuro/Psych  Headaches,    GI/Hepatic negative GI ROS, Neg liver ROS,   Endo/Other  negative endocrine ROS  Renal/GU negative Renal ROS     Musculoskeletal negative musculoskeletal ROS (+)   Abdominal   Peds  Hematology negative hematology ROS (+)   Anesthesia Other Findings   Reproductive/Obstetrics                            Anesthesia Physical Anesthesia Plan  ASA: III  Anesthesia Plan: General   Post-op Pain Management:    Induction: Intravenous  PONV Risk Score and Plan: 2 and Ondansetron and Dexamethasone  Airway Management Planned:   Additional Equipment:   Intra-op Plan:   Post-operative Plan: Extubation in OR  Informed Consent: I have reviewed the patients History and Physical, chart, labs and discussed the procedure including the risks, benefits and alternatives for the proposed anesthesia with the patient or authorized representative who has indicated his/her understanding and acceptance.     Dental advisory given  Plan Discussed with: Anesthesiologist and CRNA  Anesthesia Plan Comments: (PAT note written 08/10/2020 by Myra Gianotti, PA-C. SAME DAY WORK-UP   )      Anesthesia Quick Evaluation

## 2020-08-10 NOTE — Progress Notes (Addendum)
Anesthesia Chart Review: Marcus Collier   Case: [id number] Date/Time: 08/11/20 1115   Procedure: DIRECT LARYNGOSCOPY WITH BIOPSY (N/A )   Anesthesia type: General   Pre-op diagnosis: history of laryngeal cancer   Location: [facility] OR ROOM 06 / [place] [facility]   Surgeons: Izora Gala, MD      DISCUSSION: Patient is a 74 year old male scheduled for the above procedure.  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 More recently he has developed worsening hoarseness with right ear pain and sore throat. He was evaluated by Dr. Constance Holster on 07/27/20.    07/27/20 flexible fiberoptic laryngoscopy showed: The posterior soft palate, uvula, tongue base and vallecula were visualized and appeared healthy without mucosal masses or lesions. There is an ulcerative and slightly exophytic lesion involving the entire glottis circumferentially. Airway is restricted but not dangerously so. Supraglottic larynx with edema. Unable to evaluate the subglottic larynx. Piriforms appear clear. Findings concerning for laryngeal cancer and may ultimately require total laryngectomy. CT neck and chest done with laryngeal biopsy recommended. He also had bilateral lung nodules that will be discussed at next tumor board. Incidental finding of partially imaged 3.4 cm AAA.    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 Syncopal episode 04/15/19, suspected vasovagal, possibly dehydration contributing. ED work-up unremarkable although some intermittent bradycardia (HR 47), so b-blocker held and advised PCP follow-up.   He denied chest pain and SOB. No further syncope. He has not seen a cardiology in years--CAD has been followed by his PCP Dr. Marisue Humble. Records requested but are pending. Currently last EKG from 04/15/19 with not comparison and no copies of previous cardiac testing.   2nd [place]  COVID-19 vaccine 02/05/20.  08/09/2020 presurgical COVID-19 test negative. If additional pertinent records received prior to surgery, then I will plan to update my note.  Otherwise I reviewed currently available information with anesthesiologist Adele Barthel, MD. Patient with suspected laryngeal cancer and needs biopsy to confirm diagnosis and establish treatment plan.  Anesthesia team to evaluate on the day of surgery.   VS: Ht 5' 8.5" (1.74 m)   Wt 72.6 kg   BMI 23.97 kg/m   BP Readings from Last 3 Encounters:  04/15/19 (!) 162/59  11/03/15 (!) 169/86  11/13/14 (!) 156/73   Pulse Readings from Last 3 Encounters:  04/15/19 (!) 50  11/03/15 (!) 54  11/13/14 (!) 54    PROVIDERS: [name], MD is PCP  Eppie Gibson, MD is RAD-ONC Patient reportedly does not see a cardiologist any more. Had seen Mertie Moores, MD (when with [facility]). 2015 notes scanned under Media tab indicate that Dr. Marisue Humble was following CAD.    LABS: For day of procedure.    IMAGES: CT soft tissue neck 08/05/20: IMPRESSION: - Likely post radiation changes in the hypopharynx and larynx. Possible superimposed recurrent malignancy at the level of the glottis and vestibule. - No enlarged or necrotic lymph nodes identified. - Atherosclerotic plaque at the ICA origins with ulceration on the left.  CT Chest 08/05/20 (ordered by Dr. Constance Holster): IMPRESSION: 1. Partially visualized proximal abdominal aortic aneurysm measuring up to 3.4 cm in anterior-posterior dimension. As the entirety of the abdominal aorta is not visualized on this exam, follow-up CT abdomen and pelvis with IV contrast may be useful. 2. Multiple bilateral pulmonary nodules, largest part solid nodule measuring 8 mm in the right lower lobe. Follow-up non-contrast  CT recommended at 3-6 months to confirm persistence. If unchanged, and solid component remains <6 mm, annual CT is recommended until 5 years of stability has been  established. If persistent these nodules should be considered highly suspicious if the solid component of the nodule is 6 mm or greater in size and enlarging. This recommendation follows the consensus statement: Guidelines for Management of Incidental Pulmonary Nodules Detected on CT Images: From the Fleischner Society 2017; Radiology 2017; 284:228-243. 3. Aortic Atherosclerosis (ICD10-I70.0) and Emphysema (ICD10-J43.9).   EKG: Last EKG found was from 04/15/19 and showed SR, non-specific IVCD, abnormal lateral Q waves.    CV: Requested last testing from [facility].  Past Medical History:  Diagnosis Date  . Acute myocardial infarction ([place])   . Cancer of vocal cord ([place]) 11/15/12   Left  . Headache    migraine  . Hoarseness of voice   . Hypertension   . 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 S/P radiation therapy 12/10/2012 - 01/20/2013   Vocal Cords/ 63 Gray/ 28 Fractions  . Unspecified malignant neoplasm of skin, site unspecified     Past Surgical History:  Procedure Laterality Date  . Biopsy of Vocal cord  11/15/12   Right and Left  . Biospy mouth  11/15/12  . CARDIAC CATHETERIZATION     with stents   . CORONARY ANGIOPLASTY      MEDICATIONS: No current facility-administered medications for this encounter.   Marland Kitchen acetaminophen (TYLENOL) 500 MG tablet  . amLODipine (NORVASC) 10 MG tablet  . atorvastatin (LIPITOR) 80 MG tablet  . hydrALAZINE (APRESOLINE) 25 MG tablet  . quinapril (ACCUPRIL) 40 MG tablet  . aspirin EC 81 MG tablet    Myra Gianotti, PA-C Surgical Short Stay/Anesthesiology [facility] Phone [phone] [facility] Phone [phone] 08/10/2020 4:38 PM

## 2020-08-10 NOTE — Progress Notes (Addendum)
Mr. Marcus Collier denies chest pain or shortness of breath. Patient  tested  Negative for Covid on 8/16/2 and has been in quarantine since that time. Mr. Marcus Collier passed out and hit his head while he was outdoors in April of this year.  Mr. Marcus Collier states that it has not happen anymore. Mr. Marcus Collier had a MI and cardiac stent(s) were placed- "probably 20 years ago"  Mr. Marcus Collier does not have a cardiologist, patient's PCP is Dr. Marisue Humble with [facility], I requested Last office visit, Labs, any cardiac studies.

## 2020-08-11 ENCOUNTER — Other Ambulatory Visit: Payer: Self-pay

## 2020-08-11 ENCOUNTER — Encounter (HOSPITAL_COMMUNITY)
Admission: RE | Disposition: A | Discharge status: Home / Self Care | Payer: Self-pay | Source: Home / Self Care | Attending: Otolaryngology

## 2020-08-11 ENCOUNTER — Ambulatory Visit (HOSPITAL_COMMUNITY)
Admission: RE | Admit: 2020-08-11 | Discharge: 2020-08-11 | Disposition: A | Discharge status: Home / Self Care | Payer: Medicare Other | Attending: Otolaryngology | Admitting: Otolaryngology

## 2020-08-11 ENCOUNTER — Ambulatory Visit (HOSPITAL_COMMUNITY): Payer: Medicare Other

## 2020-08-11 ENCOUNTER — Encounter (HOSPITAL_COMMUNITY): Payer: Self-pay | Admitting: Otolaryngology

## 2020-08-11 DIAGNOSIS — I252 Old myocardial infarction: Secondary | ICD-10-CM | POA: Diagnosis not present

## 2020-08-11 DIAGNOSIS — Z923 Personal history of irradiation: Secondary | ICD-10-CM | POA: Diagnosis not present

## 2020-08-11 DIAGNOSIS — I10 Essential (primary) hypertension: Secondary | ICD-10-CM | POA: Diagnosis not present

## 2020-08-11 DIAGNOSIS — Z79899 Other long term (current) drug therapy: Secondary | ICD-10-CM | POA: Diagnosis not present

## 2020-08-11 DIAGNOSIS — Z7982 Long term (current) use of aspirin: Secondary | ICD-10-CM | POA: Diagnosis not present

## 2020-08-11 DIAGNOSIS — I2510 Atherosclerotic heart disease of native coronary artery without angina pectoris: Secondary | ICD-10-CM | POA: Diagnosis not present

## 2020-08-11 DIAGNOSIS — Z955 Presence of coronary angioplasty implant and graft: Secondary | ICD-10-CM | POA: Insufficient documentation

## 2020-08-11 DIAGNOSIS — Z85828 Personal history of other malignant neoplasm of skin: Secondary | ICD-10-CM | POA: Insufficient documentation

## 2020-08-11 DIAGNOSIS — Z87891 Personal history of nicotine dependence: Secondary | ICD-10-CM | POA: Insufficient documentation

## 2020-08-11 DIAGNOSIS — E7800 Pure hypercholesterolemia, unspecified: Secondary | ICD-10-CM | POA: Insufficient documentation

## 2020-08-11 DIAGNOSIS — D020 Carcinoma in situ of larynx: Secondary | ICD-10-CM | POA: Insufficient documentation

## 2020-08-11 DIAGNOSIS — J383 Other diseases of vocal cords: Secondary | ICD-10-CM | POA: Diagnosis present

## 2020-08-11 HISTORY — DX: Headache, unspecified: R51.9

## 2020-08-11 HISTORY — DX: Family history of other specified conditions: Z84.89

## 2020-08-11 HISTORY — PX: DIRECT LARYNGOSCOPY: SHX5326

## 2020-08-11 LAB — BASIC METABOLIC PANEL
Anion gap: 10 (ref 5–15)
BUN: 10 mg/dL (ref 8–23)
CO2: 25 mmol/L (ref 22–32)
Calcium: 8.7 mg/dL — ABNORMAL LOW (ref 8.9–10.3)
Chloride: 103 mmol/L (ref 98–111)
Creatinine, Ser: 0.94 mg/dL (ref 0.61–1.24)
GFR calc Af Amer: >60 mL/min (ref >60)
GFR calc non Af Amer: >60 mL/min (ref >60)
Glucose, Bld: 117 mg/dL — ABNORMAL HIGH (ref 70–99)
Potassium: 3.2 mmol/L — ABNORMAL LOW (ref 3.5–5.1)
Sodium: 138 mmol/L (ref 135–145)

## 2020-08-11 SURGERY — LARYNGOSCOPY, DIRECT
Anesthesia: General | Site: Throat

## 2020-08-11 MED ORDER — 0.9 % SODIUM CHLORIDE (POUR BTL) OPTIME
TOPICAL | Status: DC | PRN
  Administered 2020-08-11: 1000 mL

## 2020-08-11 MED ORDER — FENTANYL CITRATE (PF) 100 MCG/2ML IJ SOLN: 25.0000 ug | INTRAMUSCULAR | Status: DC | PRN

## 2020-08-11 MED ORDER — DEXAMETHASONE SODIUM PHOSPHATE 10 MG/ML IJ SOLN
INTRAMUSCULAR | Status: DC | PRN
  Administered 2020-08-11: 5 mg via INTRAVENOUS

## 2020-08-11 MED ORDER — PROMETHAZINE HCL 25 MG/ML IJ SOLN: 6.2500 mg | INTRAMUSCULAR | Status: DC | PRN

## 2020-08-11 MED ORDER — FENTANYL CITRATE (PF) 250 MCG/5ML IJ SOLN
INTRAMUSCULAR | Status: DC | PRN
  Administered 2020-08-11: 100 ug via INTRAVENOUS

## 2020-08-11 MED ORDER — LACTATED RINGERS IV SOLN: INTRAVENOUS | Status: DC

## 2020-08-11 MED ORDER — FENTANYL CITRATE (PF) 250 MCG/5ML IJ SOLN
INTRAMUSCULAR | Status: AC
  Filled 2020-08-11: qty 5

## 2020-08-11 MED ORDER — PROPOFOL 10 MG/ML IV BOLUS
INTRAVENOUS | Status: AC
  Filled 2020-08-11: qty 40

## 2020-08-11 MED ORDER — ONDANSETRON HCL 4 MG/2ML IJ SOLN
INTRAMUSCULAR | Status: DC | PRN
  Administered 2020-08-11: 4 mg via INTRAVENOUS

## 2020-08-11 MED ORDER — CELECOXIB 200 MG PO CAPS
200.0000 mg | ORAL_CAPSULE | Freq: Once | ORAL | Status: AC
  Administered 2020-08-11: 200 mg via ORAL
  Filled 2020-08-11: qty 1

## 2020-08-11 MED ORDER — ACETAMINOPHEN 500 MG PO TABS
1000.0000 mg | ORAL_TABLET | Freq: Once | ORAL | Status: AC
  Administered 2020-08-11: 1000 mg via ORAL
  Filled 2020-08-11: qty 2

## 2020-08-11 MED ORDER — ETOMIDATE 2 MG/ML IV SOLN
INTRAVENOUS | Status: AC
  Filled 2020-08-11: qty 10

## 2020-08-11 MED ORDER — LACTATED RINGERS IV SOLN: INTRAVENOUS | Status: DC | PRN

## 2020-08-11 MED ORDER — ORAL CARE MOUTH RINSE: 15.0000 mL | Freq: Once | OROMUCOSAL | Status: AC

## 2020-08-11 MED ORDER — PROPOFOL 10 MG/ML IV BOLUS
INTRAVENOUS | Status: DC | PRN
  Administered 2020-08-11: 150 mg via INTRAVENOUS

## 2020-08-11 MED ORDER — EPINEPHRINE HCL (NASAL) 0.1 % NA SOLN
NASAL | Status: DC | PRN
  Administered 2020-08-11: 1 [drp] via TOPICAL

## 2020-08-11 MED ORDER — LIDOCAINE 2% (20 MG/ML) 5 ML SYRINGE
INTRAMUSCULAR | Status: DC | PRN
  Administered 2020-08-11: 100 mg via INTRAVENOUS

## 2020-08-11 MED ORDER — CHLORHEXIDINE GLUCONATE 0.12 % MT SOLN
15.0000 mL | Freq: Once | OROMUCOSAL | Status: AC
  Administered 2020-08-11: 15 mL via OROMUCOSAL
  Filled 2020-08-11: qty 15

## 2020-08-11 MED ORDER — SUCCINYLCHOLINE CHLORIDE 200 MG/10ML IV SOSY
PREFILLED_SYRINGE | INTRAVENOUS | Status: DC | PRN
  Administered 2020-08-11: 160 mg via INTRAVENOUS

## 2020-08-11 SURGICAL SUPPLY — 18 items
CANISTER SUCT 3000ML PPV (MISCELLANEOUS) ×3 IMPLANT
COVER BACK TABLE 60X90IN (DRAPES) ×3 IMPLANT
COVER MAYO STAND STRL (DRAPES) ×3 IMPLANT
COVER WAND RF STERILE (DRAPES) IMPLANT
DRAPE HALF SHEET 40X57 (DRAPES) ×3 IMPLANT
GAUZE 4X4 16PLY RFD (DISPOSABLE) IMPLANT
GLOVE ECLIPSE 7.5 STRL STRAW (GLOVE) ×3 IMPLANT
GUARD TEETH (MISCELLANEOUS) ×3 IMPLANT
KIT BASIN OR (CUSTOM PROCEDURE TRAY) ×3 IMPLANT
KIT TURNOVER KIT B (KITS) ×3 IMPLANT
NS IRRIG 1000ML POUR BTL (IV SOLUTION) ×3 IMPLANT
PAD ARMBOARD 7.5X6 YLW CONV (MISCELLANEOUS) ×6 IMPLANT
PATTIES SURGICAL .5 X3 (DISPOSABLE) ×3 IMPLANT
SOL ANTI FOG 6CC (MISCELLANEOUS) ×1 IMPLANT
SOLUTION ANTI FOG 6CC (MISCELLANEOUS) ×2
TOWEL GREEN STERILE (TOWEL DISPOSABLE) ×3 IMPLANT
TUBE CONNECTING 12'X1/4 (SUCTIONS) ×1
TUBE CONNECTING 12X1/4 (SUCTIONS) ×2 IMPLANT

## 2020-08-11 NOTE — Anesthesia Procedure Notes (Signed)
Procedure Name: Intubation Date/Time: 08/11/2020 7:43 AM Performed by: Glynda Jaeger, CRNA Pre-anesthesia Checklist: Patient identified, Patient being monitored, Timeout performed, Emergency Drugs available and Suction available Patient Re-evaluated:Patient Re-evaluated prior to induction Oxygen Delivery Method: Circle System Utilized Preoxygenation: Pre-oxygenation with 100% oxygen Induction Type: IV induction Ventilation: Mask ventilation without difficulty Laryngoscope Size: 3, Glidescope and 4 Grade View: Grade I Tube type: Oral Tube size: 6.5 mm Number of attempts: 1 Airway Equipment and Method: Stylet Placement Confirmation: ETT inserted through vocal cords under direct vision,  positive ETCO2 and breath sounds checked- equal and bilateral Secured at: 21 cm Tube secured with: Tape Dental Injury: Teeth and Oropharynx as per pre-operative assessment  Comments: Elective glide

## 2020-08-11 NOTE — Transfer of Care (Signed)
Immediate Anesthesia Transfer of Care Note  Patient: Marcus Collier  Procedure(s) Performed: DIRECT LARYNGOSCOPY WITH BIOPSY (N/A Throat)  Patient Location: PACU  Anesthesia Type:General  Level of Consciousness: awake, alert , oriented, patient cooperative and responds to stimulation  Airway & Oxygen Therapy: Patient Spontanous Breathing and Patient connected to face mask oxygen  Post-op Assessment: Report given to RN, Post -op Vital signs reviewed and stable and Patient moving all extremities X 4  Post vital signs: Reviewed and stable  Last Vitals:  Vitals Value Taken Time  BP 181/155 08/11/20 0911  Temp    Pulse 90 08/11/20 0911  Resp 23 08/11/20 0911  SpO2 100 % 08/11/20 0911  Vitals shown include unvalidated device data.  Last Pain:  Vitals:   08/11/20 0718  TempSrc: Oral  PainSc:          Complications: No complications documented.

## 2020-08-11 NOTE — Interval H&P Note (Signed)
History and Physical Interval Note:  08/11/2020 8:27 AM  Marcus Collier  has presented today for surgery, with the diagnosis of history of laryngeal cancer.  The various methods of treatment have been discussed with the patient and family. After consideration of risks, benefits and other options for treatment, the patient has consented to  Procedure(s): DIRECT LARYNGOSCOPY WITH BIOPSY (N/A) as a surgical intervention.  The patient's history has been reviewed, patient examined, no change in status, stable for surgery.  I have reviewed the patient's chart and labs.  Questions were answered to the patient's satisfaction.     Izora Gala

## 2020-08-11 NOTE — Op Note (Signed)
OPERATIVE REPORT  DATE OF SURGERY: 08/11/2020  PATIENT:  Marcus Collier,  74 y.o. male  PRE-OPERATIVE DIAGNOSIS:  history of laryngeal cancer, laryngeal mass  POST-OPERATIVE DIAGNOSIS:  history of laryngeal cancer, laryngeal mass  PROCEDURE:  Procedure(s): DIRECT LARYNGOSCOPY WITH BIOPSY  SURGEON:  Beckie Salts, MD  ASSISTANTS: None  ANESTHESIA:   General   EBL: 20 ml  DRAINS: None  LOCAL MEDICATIONS USED:  None  SPECIMEN: 1.  Biopsy left false vocal cord, 2.  Biopsy right AE fold.  COUNTS:  Correct  PROCEDURE DETAILS: The patient was taken to the operating room and placed on the operating table in the supine position. Following induction of general endotracheal anesthesia, the table was turned 90 degrees and the patient was draped in a standard fashion for upper endoscopy.  The maxilla is edentulous.  A Jako laryngoscope was inserted into the oral cavity used to evaluate the hypopharynx and the larynx.  The piriform sinuses were clear.  The post cricoid area was clear.  The esophageal introitus was clear.  The glottis was clear.  The arytenoids were clear.  There is an erosive and necrotic appearing mass involving the left false cord circumferentially around the anterior commissure and into the right AE fold.  The subglottis was clear.  Biopsies were taken from both sides.  Topical adrenaline was applied on pledgets.  The patient was then awakened extubated and transferred to recovery in stable condition.    PATIENT DISPOSITION:  To PACU, stable

## 2020-08-11 NOTE — Anesthesia Postprocedure Evaluation (Signed)
Anesthesia Post Note  Patient: Marcus Collier  Procedure(s) Performed: DIRECT LARYNGOSCOPY WITH BIOPSY (N/A Throat)     Patient location during evaluation: PACU Anesthesia Type: General Level of consciousness: sedated Pain management: pain level controlled Vital Signs Assessment: post-procedure vital signs reviewed and stable Respiratory status: spontaneous breathing and respiratory function stable Cardiovascular status: stable Postop Assessment: no apparent nausea or vomiting Anesthetic complications: no   No complications documented.  Last Vitals:  Vitals:   08/11/20 0930 08/11/20 0933  BP: (!) 133/53 133/64  Pulse: 82 71  Resp: 20 16  Temp:  36.7 C  SpO2: 96% 96%    Last Pain:  Vitals:   08/11/20 0933  TempSrc:   PainSc: 0-No pain                 Michael Ventresca DANIEL

## 2020-08-11 NOTE — Discharge Instructions (Signed)
Resume diet as tolerated.  Okay to use Tylenol/Motrin as needed for pain.

## 2020-08-12 ENCOUNTER — Encounter (HOSPITAL_COMMUNITY): Payer: Self-pay | Admitting: Otolaryngology

## 2020-08-13 LAB — SURGICAL PATHOLOGY

## 2020-09-13 ENCOUNTER — Other Ambulatory Visit: Payer: Self-pay

## 2020-09-13 ENCOUNTER — Ambulatory Visit: Payer: Medicare Other

## 2020-09-13 ENCOUNTER — Encounter: Payer: Self-pay | Admitting: Cardiology

## 2020-09-13 ENCOUNTER — Ambulatory Visit: Payer: Medicare Other | Admitting: Cardiology

## 2020-09-13 ENCOUNTER — Encounter (HOSPITAL_COMMUNITY): Payer: Self-pay | Admitting: Otolaryngology

## 2020-09-13 VITALS — BP 122/68 | HR 101 | Resp 16 | Ht 68.5 in | Wt 142.0 lb

## 2020-09-13 DIAGNOSIS — Z01818 Encounter for other preprocedural examination: Secondary | ICD-10-CM

## 2020-09-13 DIAGNOSIS — I2510 Atherosclerotic heart disease of native coronary artery without angina pectoris: Secondary | ICD-10-CM

## 2020-09-13 NOTE — H&P (Signed)
HPI:   Marcus Collier is a 74 y.o. male who presents as a consult Patient.   Referring Provider: Cleotis Nipper*  Chief complaint: Ear pain, sore throat, hoarseness.  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 He has continued to smoke until about 2 years ago. He drinks periodically. He lost his voice about 6 months ago. In the past 2 months he has developed right ear pain and sore throat. He has no trouble swallowing or breathing.  PMH/Meds/All/SocHx/FamHx/ROS:   Past Medical History:  Diagnosis Date  . Allergy  . History of laryngeal cancer  . Hoarseness  . Hypercholesteremia  . Hypertension  . Malignant neoplasm [facility])   Past Surgical History:  Procedure Laterality Date  . coronary artery surgery  . direct laryngnoscopy   No family history of bleeding disorders, wound healing problems or difficulty with anesthesia.   Social History   Socioeconomic History  . Marital status: Married  Spouse name: Not on file  . Number of children: Not on file  . Years of education: Not on file  . Highest education level: Not on file  Occupational History  . Not on file  Tobacco Use  . Smoking status: Former Research scientist (life sciences)  . Smokeless tobacco: Never Used  Vaping Use  . Vaping Use: Never used  Substance and Sexual Activity  . Alcohol use: Not on file  . Drug use: Not on file  . Sexual activity: Not on file  Other Topics Concern  . Not on file  Social History Narrative  . Not on file   Social Determinants of Health   Financial Resource Strain:  . Difficulty of Paying Living Expenses:  Food Insecurity:  . Worried About Charity fundraiser in the Last Year:  . Arboriculturist in the Last Year:  Transportation Needs:  . Film/video editor (Medical):  Marland Kitchen Lack of Transportation (Non-Medical):  Physical Activity:  . Days of Exercise per Week:  . Minutes of Exercise per Session:  Stress:  . Feeling of Stress :  Social Connections:  . Frequency of  Communication with Friends and Family:  . Frequency of Social Gatherings with Friends and Family:  . Attends Religious Services:  . Active Member of Clubs or Organizations:  . Attends Archivist Meetings:  Marland Kitchen Marital Status:   Current Outpatient Medications:  . amLODIPine (NORVASC) 10 MG tablet, TAKE ONE TABLET BY MOUTH EVERY MORNING, Disp: , Rfl:  . aspirin 325 MG tablet *ANTIPLATELET*, Take 325 mg by mouth., Disp: , Rfl:  . atorvastatin (LIPITOR) 80 MG tablet, TAKE ONE TABLET BY MOUTH EVERY EVENING, Disp: , Rfl:  . hydrALAZINE (APRESOLINE) 25 MG tablet, TAKE ONE TABLET BY MOUTH EVERY MORNING and TAKE ONE TABLET BY MOUTH EVERY EVENING, Disp: , Rfl:  . quinapril (ACCUPRIL) 40 MG tablet, , Disp: , Rfl:   A complete ROS was performed with pertinent positives/negatives noted in the HPI. The remainder of the ROS are negative.   Physical Exam:   BP 121/58  Pulse 76  Temp (!) 96.5 F (35.8 C)  Ht 1.753 m (5\' 9" )  Wt 72.1 kg (159 lb)  BMI 23.48 kg/m   General: Healthy and alert, in no distress, breathing easily. Normal affect. In a pleasant mood. He is aphonic. Head: Normocephalic, atraumatic. No masses, or scars. Eyes: Pupils are equal, and reactive to light. Vision is grossly intact. No spontaneous or gaze nystagmus. Ears: Ear canals are clear on the left with impacted cerumen on the  right that was cleaned out under the microscope. Tympanic membranes are intact, with normal landmarks and the middle ears are clear and healthy. Hearing: Grossly diminished. Nose: Nasal cavities are clear with healthy mucosa, no polyps or exudate. Airways are patent. Face: No masses or scars, facial nerve function is symmetric. Oral Cavity: No mucosal abnormalities are noted. Tongue with normal mobility. Maxillary denture in place. Oropharynx: Tonsils are absent. There are no mucosal masses identified. Tongue base appears normal and healthy. Larynx/Hypopharynx: Inadequate mirror exam. Chest:  Deferred Neck: No palpable masses, no cervical adenopathy, no thyroid nodules or enlargement. Neuro: Cranial nerves II-XII with normal function. Balance: Normal gate. Other findings: none.  Independent Review of Additional Tests or Records:  none  Procedures:  Procedure note: Flexible fiberoptic laryngoscopy  Details of the procedure were explained to the patient and all questions were answered.   Procedure:   After anesthetizing the nasal cavity with topical lidocaine and oxymetazoline, the flexible endoscope was introduced and passed through the nasal cavity into the nasopharynx. The scope was then advanced to the level of the oropharynx, then the hypopharynx and larynx. Scope number 10 was used.  Findings:   The posterior soft palate, uvula, tongue base and vallecula were visualized and appeared healthy without mucosal masses or lesions. There is an ulcerative and slightly exophytic lesion involving the entire glottis circumferentially. Airway is restricted but not dangerously so. Supraglottic larynx with edema. Unable to evaluate the subglottic larynx. Piriforms appear clear.  The scope was withdrawn from the nose. He tolerated the procedure well.   Procedure note:  Indications: Cerumen impaction  Details of cerumen removal were discussed with the patient and all questions were answered.  Procedure:  Using the operating microscope, the right side was cleaned of cerumen using suction. There was no signs of infection. Symptoms were releaved.  He tolerated this procedure well. There were no complications.  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 We will get a CT scan of the neck to further evaluate the extent. He is likely going to require total laryngectomy. There is no palpable lymph nodes currently. We will reevaluate and discuss again after the CT scan.

## 2020-09-13 NOTE — Progress Notes (Signed)
Patient referred by Marcus Arabian, MD for preop evaluation  Subjective:   Marcus Collier, male    DOB: 02-11-1946, 74 y.o.   MRN: [id number]   Chief Complaint  Patient presents with  . Medical Clearance    Throat cancer  . New Patient (Initial Visit)    Referre by Dr. Izora Gala     HPI  74 y.o. Caucasian male with CAD, h/o MI and stentsX3 in late 29s ([place]), controlled hypertension, new diagnosis of vocal cord cancer.  Patient was diagnosed with vocal cord cancer in summer 2021.  He is currently scheduled to undergo surgery on 10/13/2020, but likelihood of this being performed sooner.  He denies any chest pain.  Recently, he has experienced shortness of breath which he attributes to his cancer.  He denies any leg edema, orthopnea, PND symptoms.  Past Medical History:  Diagnosis Date  . Acute myocardial infarction ([place])   . Cancer of vocal cord ([place]) 11/15/12   Left  . Family history of adverse reaction to anesthesia   . Headache    migraine  . Hoarseness of voice   . Hypertension   . 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 S/P radiation therapy 12/10/2012 - 01/20/2013   Vocal Cords/ 63 Gray/ 28 Fractions  . Unspecified malignant neoplasm of skin, site unspecified      Past Surgical History:  Procedure Laterality Date  . Biopsy of Vocal cord  11/15/12   Right and Left  . Biospy mouth  11/15/12  . CARDIAC CATHETERIZATION     with stents   . CORONARY ANGIOPLASTY    . DIRECT LARYNGOSCOPY N/A 08/11/2020   Procedure: DIRECT LARYNGOSCOPY WITH BIOPSY;  Surgeon: Izora Gala, MD;  Location: [place];  Service: ENT;  Laterality: N/A;     Social History   Tobacco Use  Smoking Status Former Smoker  . Packs/day: 1.00  . Years: 52.00  . Pack years: 52.00  . Types: Cigarettes  . Quit date: 11/26/2002  . Years since quitting: 17.8  Smokeless Tobacco Never Used    Social History   Substance and Sexual Activity  Alcohol Use Not Currently    Comment: social drinker     Family History  Problem Relation Age of Onset  . Heart attack Mother   . Heart attack Father   . Hypertension Father      Current Outpatient Medications on File Prior to Visit  Medication Sig Dispense Refill  . acetaminophen (TYLENOL) 500 MG tablet Take 500 mg by mouth every 6 (six) hours as needed for headache.    Marland Kitchen amLODipine (NORVASC) 10 MG tablet Take 10 mg by mouth every morning.    Marland Kitchen aspirin EC 81 MG tablet Take 81 mg by mouth every evening. Swallow whole.    Marland Kitchen atorvastatin (LIPITOR) 80 MG tablet Take 80 mg by mouth every evening.    . hydrALAZINE (APRESOLINE) 25 MG tablet Take 25 mg by mouth 2 (two) times daily.    . quinapril (ACCUPRIL) 40 MG tablet Take 40 mg by mouth daily.      No current facility-administered medications on file prior to visit.    Cardiovascular and other pertinent studies:  EKG 09/13/2020: Sinus tachycardia 106 bpm Bi-atrial enlargement Old inferior infarct  Nonspecific T-abnormality   Recent labs: 08/11/2020: Glucose 117, BUN/Cr 10/0.94. EGFR >60. Na/K 138/3.2.   03/2019: H/H 13/40. MCV 98. Platelets 159   = Review of Systems  HENT:  Vocal cord cancer  Cardiovascular: Positive for dyspnea on exertion. Negative for leg swelling, palpitations and syncope.  Respiratory: Positive for cough and shortness of breath.          Vitals:   09/13/20 0844  BP: 122/68  Pulse: (!) 101  Resp: 16  SpO2: 94%     Body mass index is 21.28 kg/m. Filed Weights   09/13/20 0844  Weight: 142 lb (64.4 kg)     Objective:   Physical Exam Vitals and nursing note reviewed.  Constitutional:      General: He is not in acute distress. Neck:     Vascular: No JVD.  Cardiovascular:     Rate and Rhythm: Normal rate and regular rhythm.     Heart sounds: Normal heart sounds. No murmur heard.   Pulmonary:     Effort: Pulmonary effort is normal.     Breath sounds: Normal breath sounds. No wheezing or rales.           Assessment & Recommendations:   74 y.o. Caucasian male with CAD, h/o MI and stentsX3 in late 78s ([place]), controlled hypertension, new diagnosis of vocal cord cancer.  Preoperative stratification: Known CAD, without any ongoing symptoms of angina.  Functional capacity is limited at baseline.  However, I do not think ischemia testing will mitigate his perioperative cardiac risk.  As such, acceptable perioperative cardiac risk.  Continue aspirin, statin.  I will obtain echocardiogram for baseline EF evaluation.   Thank you for referring the patient to [place]. Please feel free to contact with any questions.   Nigel Mormon, MD Pager: [phone] Office: [phone]

## 2020-09-14 ENCOUNTER — Emergency Department (HOSPITAL_COMMUNITY): Payer: Medicare Other | Admitting: Vascular Surgery

## 2020-09-14 ENCOUNTER — Encounter (HOSPITAL_COMMUNITY)
Admission: EM | Disposition: A | Discharge status: Home / Self Care | Payer: Self-pay | Source: Home / Self Care | Attending: Otolaryngology

## 2020-09-14 ENCOUNTER — Other Ambulatory Visit: Payer: Self-pay

## 2020-09-14 ENCOUNTER — Encounter (HOSPITAL_COMMUNITY): Payer: Self-pay | Admitting: Otolaryngology

## 2020-09-14 ENCOUNTER — Inpatient Hospital Stay (HOSPITAL_COMMUNITY)
Admission: EM | Admit: 2020-09-14 | Discharge: 2020-09-24 | DRG: 011 | Disposition: A | Discharge status: Home Health | Payer: Medicare Other | Attending: Otolaryngology | Admitting: Otolaryngology

## 2020-09-14 ENCOUNTER — Encounter (HOSPITAL_COMMUNITY): Payer: Self-pay

## 2020-09-14 ENCOUNTER — Other Ambulatory Visit (HOSPITAL_COMMUNITY)
Admission: RE | Admit: 2020-09-14 | Discharge: 2020-09-14 | Disposition: A | Discharge status: Home / Self Care | Payer: Medicare Other | Source: Ambulatory Visit | Attending: Otolaryngology | Admitting: Otolaryngology

## 2020-09-14 ENCOUNTER — Inpatient Hospital Stay (HOSPITAL_COMMUNITY): Admission: RE | Admit: 2020-09-14 | Payer: Medicare Other | Source: Home / Self Care | Admitting: Otolaryngology

## 2020-09-14 ENCOUNTER — Emergency Department (HOSPITAL_COMMUNITY): Payer: Medicare Other

## 2020-09-14 DIAGNOSIS — E43 Unspecified severe protein-calorie malnutrition: Secondary | ICD-10-CM | POA: Diagnosis present

## 2020-09-14 DIAGNOSIS — Z23 Encounter for immunization: Secondary | ICD-10-CM

## 2020-09-14 DIAGNOSIS — Z87891 Personal history of nicotine dependence: Secondary | ICD-10-CM | POA: Diagnosis not present

## 2020-09-14 DIAGNOSIS — Z880 Allergy status to penicillin: Secondary | ICD-10-CM | POA: Diagnosis not present

## 2020-09-14 DIAGNOSIS — Z8249 Family history of ischemic heart disease and other diseases of the circulatory system: Secondary | ICD-10-CM | POA: Diagnosis not present

## 2020-09-14 DIAGNOSIS — C320 Malignant neoplasm of glottis: Secondary | ICD-10-CM | POA: Diagnosis present

## 2020-09-14 DIAGNOSIS — J988 Other specified respiratory disorders: Secondary | ICD-10-CM | POA: Diagnosis present

## 2020-09-14 DIAGNOSIS — I10 Essential (primary) hypertension: Secondary | ICD-10-CM | POA: Diagnosis present

## 2020-09-14 DIAGNOSIS — Z9861 Coronary angioplasty status: Secondary | ICD-10-CM

## 2020-09-14 DIAGNOSIS — Z85828 Personal history of other malignant neoplasm of skin: Secondary | ICD-10-CM

## 2020-09-14 DIAGNOSIS — I350 Nonrheumatic aortic (valve) stenosis: Secondary | ICD-10-CM | POA: Diagnosis present

## 2020-09-14 DIAGNOSIS — Z0189 Encounter for other specified special examinations: Secondary | ICD-10-CM

## 2020-09-14 DIAGNOSIS — Z79899 Other long term (current) drug therapy: Secondary | ICD-10-CM

## 2020-09-14 DIAGNOSIS — R0603 Acute respiratory distress: Secondary | ICD-10-CM | POA: Diagnosis present

## 2020-09-14 DIAGNOSIS — Z20822 Contact with and (suspected) exposure to covid-19: Secondary | ICD-10-CM | POA: Insufficient documentation

## 2020-09-14 DIAGNOSIS — Z01812 Encounter for preprocedural laboratory examination: Secondary | ICD-10-CM | POA: Insufficient documentation

## 2020-09-14 DIAGNOSIS — Z923 Personal history of irradiation: Secondary | ICD-10-CM | POA: Diagnosis not present

## 2020-09-14 DIAGNOSIS — R061 Stridor: Secondary | ICD-10-CM | POA: Diagnosis present

## 2020-09-14 DIAGNOSIS — I252 Old myocardial infarction: Secondary | ICD-10-CM

## 2020-09-14 DIAGNOSIS — Z9002 Acquired absence of larynx: Secondary | ICD-10-CM

## 2020-09-14 HISTORY — DX: Nonrheumatic aortic (valve) stenosis: I35.0

## 2020-09-14 HISTORY — PX: LARYNGETOMY: SHX5202

## 2020-09-14 LAB — I-STAT CHEM 8, ED
BUN: 25 mg/dL — ABNORMAL HIGH (ref 8–23)
Calcium, Ion: 1.1 mmol/L — ABNORMAL LOW (ref 1.15–1.40)
Chloride: 101 mmol/L (ref 98–111)
Creatinine, Ser: 0.5 mg/dL — ABNORMAL LOW (ref 0.61–1.24)
Glucose, Bld: 283 mg/dL — ABNORMAL HIGH (ref 70–99)
HCT: 34 % — ABNORMAL LOW (ref 39.0–52.0)
Hemoglobin: 11.6 g/dL — ABNORMAL LOW (ref 13.0–17.0)
Potassium: 4.3 mmol/L (ref 3.5–5.1)
Sodium: 134 mmol/L — ABNORMAL LOW (ref 135–145)
TCO2: 26 mmol/L (ref 22–32)

## 2020-09-14 LAB — SARS CORONAVIRUS 2 BY RT PCR (HOSPITAL ORDER, PERFORMED IN ~~LOC~~ HOSPITAL LAB): SARS Coronavirus 2: NEGATIVE

## 2020-09-14 LAB — CBC WITH DIFFERENTIAL/PLATELET
Abs Immature Granulocytes: 0.2 10*3/uL — ABNORMAL HIGH (ref 0.00–0.07)
Basophils Absolute: 0.1 10*3/uL (ref 0.0–0.1)
Basophils Relative: 0 %
Eosinophils Absolute: 0 10*3/uL (ref 0.0–0.5)
Eosinophils Relative: 0 %
HCT: 35.6 % — ABNORMAL LOW (ref 39.0–52.0)
Hemoglobin: 11.6 g/dL — ABNORMAL LOW (ref 13.0–17.0)
Immature Granulocytes: 1 %
Lymphocytes Relative: 5 %
Lymphs Abs: 1.2 10*3/uL (ref 0.7–4.0)
MCH: 30.5 pg (ref 26.0–34.0)
MCHC: 32.6 g/dL (ref 30.0–36.0)
MCV: 93.7 fL (ref 80.0–100.0)
Monocytes Absolute: 1.5 10*3/uL — ABNORMAL HIGH (ref 0.1–1.0)
Monocytes Relative: 6 %
Neutro Abs: 20.3 10*3/uL — ABNORMAL HIGH (ref 1.7–7.7)
Neutrophils Relative %: 88 %
Platelets: 383 10*3/uL (ref 150–400)
RBC: 3.8 MIL/uL — ABNORMAL LOW (ref 4.22–5.81)
RDW: 12.9 % (ref 11.5–15.5)
WBC: 23.2 10*3/uL — ABNORMAL HIGH (ref 4.0–10.5)
nRBC: 0 % (ref 0.0–0.2)

## 2020-09-14 LAB — COMPREHENSIVE METABOLIC PANEL
ALT: 70 U/L — ABNORMAL HIGH (ref 0–44)
AST: 38 U/L (ref 15–41)
Albumin: 2.9 g/dL — ABNORMAL LOW (ref 3.5–5.0)
Alkaline Phosphatase: 121 U/L (ref 38–126)
Anion gap: 12 (ref 5–15)
BUN: 25 mg/dL — ABNORMAL HIGH (ref 8–23)
CO2: 22 mmol/L (ref 22–32)
Calcium: 8.9 mg/dL (ref 8.9–10.3)
Chloride: 96 mmol/L — ABNORMAL LOW (ref 98–111)
Creatinine, Ser: 0.75 mg/dL (ref 0.61–1.24)
GFR calc Af Amer: >60 mL/min (ref >60)
GFR calc non Af Amer: >60 mL/min (ref >60)
Glucose, Bld: 274 mg/dL — ABNORMAL HIGH (ref 70–99)
Potassium: 4.3 mmol/L (ref 3.5–5.1)
Sodium: 130 mmol/L — ABNORMAL LOW (ref 135–145)
Total Bilirubin: 0.7 mg/dL (ref 0.3–1.2)
Total Protein: 7.1 g/dL (ref 6.5–8.1)

## 2020-09-14 LAB — SARS CORONAVIRUS 2 (TAT 6-24 HRS): SARS Coronavirus 2: NEGATIVE

## 2020-09-14 SURGERY — LARYNGECTOMY
Anesthesia: General

## 2020-09-14 MED ORDER — VASOPRESSIN 20 UNIT/ML IV SOLN
INTRAVENOUS | Status: AC
  Filled 2020-09-14: qty 1

## 2020-09-14 MED ORDER — MIDAZOLAM HCL 2 MG/2ML IJ SOLN
INTRAMUSCULAR | Status: AC
  Filled 2020-09-14: qty 2

## 2020-09-14 MED ORDER — LIDOCAINE HCL (CARDIAC) PF 100 MG/5ML IV SOSY
PREFILLED_SYRINGE | INTRAVENOUS | Status: DC | PRN
  Administered 2020-09-14: 20 mg via INTRATRACHEAL

## 2020-09-14 MED ORDER — RACEPINEPHRINE HCL 2.25 % IN NEBU
0.5000 mL | INHALATION_SOLUTION | Freq: Once | RESPIRATORY_TRACT | Status: AC
  Administered 2020-09-14: 0.5 mL via RESPIRATORY_TRACT
  Filled 2020-09-14: qty 0.5

## 2020-09-14 MED ORDER — LIDOCAINE-EPINEPHRINE 1 %-1:100000 IJ SOLN
INTRAMUSCULAR | Status: AC
  Filled 2020-09-14: qty 1

## 2020-09-14 MED ORDER — MIDAZOLAM HCL 5 MG/5ML IJ SOLN
INTRAMUSCULAR | Status: DC | PRN
  Administered 2020-09-14 (×4): .5 mg via INTRAVENOUS

## 2020-09-14 MED ORDER — PHENYLEPHRINE HCL-NACL 10-0.9 MG/250ML-% IV SOLN
INTRAVENOUS | Status: DC | PRN
  Administered 2020-09-14: 40 ug/min via INTRAVENOUS

## 2020-09-14 MED ORDER — VASOPRESSIN 20 UNIT/ML IV SOLN
INTRAVENOUS | Status: DC | PRN
  Administered 2020-09-14 – 2020-09-15 (×3): 1 [IU] via INTRAVENOUS

## 2020-09-14 MED ORDER — PHENYLEPHRINE HCL (PRESSORS) 10 MG/ML IV SOLN
INTRAVENOUS | Status: DC | PRN
  Administered 2020-09-14: 120 ug via INTRAVENOUS

## 2020-09-14 MED ORDER — FENTANYL CITRATE (PF) 250 MCG/5ML IJ SOLN
INTRAMUSCULAR | Status: AC
  Filled 2020-09-14: qty 5

## 2020-09-14 MED ORDER — PROPOFOL 10 MG/ML IV BOLUS
INTRAVENOUS | Status: AC
  Filled 2020-09-14: qty 40

## 2020-09-14 MED ORDER — LACTATED RINGERS IV BOLUS
250.0000 mL | Freq: Once | INTRAVENOUS | Status: AC
  Administered 2020-09-14: 250 mL via INTRAVENOUS

## 2020-09-14 MED ORDER — LIDOCAINE-EPINEPHRINE 1 %-1:100000 IJ SOLN
INTRAMUSCULAR | Status: DC | PRN
  Administered 2020-09-14: 20 mL

## 2020-09-14 MED ORDER — DEXAMETHASONE SODIUM PHOSPHATE 10 MG/ML IJ SOLN
10.0000 mg | Freq: Once | INTRAMUSCULAR | Status: AC
  Administered 2020-09-14: 10 mg via INTRAVENOUS
  Filled 2020-09-14: qty 1

## 2020-09-14 MED ORDER — LACTATED RINGERS IV SOLN: INTRAVENOUS | Status: DC | PRN

## 2020-09-14 MED ORDER — PROPOFOL 10 MG/ML IV BOLUS
INTRAVENOUS | Status: DC | PRN
  Administered 2020-09-14: 180 mg via INTRAVENOUS
  Administered 2020-09-14 (×2): 10 mg via INTRAVENOUS

## 2020-09-14 MED ORDER — SODIUM CHLORIDE 0.9 % IV SOLN
3.0000 g | Freq: Once | INTRAVENOUS | Status: AC
  Administered 2020-09-14: 3 g via INTRAVENOUS
  Filled 2020-09-14: qty 3

## 2020-09-14 MED ORDER — 0.9 % SODIUM CHLORIDE (POUR BTL) OPTIME
TOPICAL | Status: DC | PRN
  Administered 2020-09-14: 1000 mL

## 2020-09-14 MED ORDER — FENTANYL CITRATE (PF) 250 MCG/5ML IJ SOLN
INTRAMUSCULAR | Status: DC | PRN
  Administered 2020-09-14 (×3): 25 ug via INTRAVENOUS
  Administered 2020-09-15: 75 ug via INTRAVENOUS
  Administered 2020-09-15: 25 ug via INTRAVENOUS
  Administered 2020-09-15: 50 ug via INTRAVENOUS
  Administered 2020-09-15: 25 ug via INTRAVENOUS

## 2020-09-14 MED ORDER — ROCURONIUM BROMIDE 100 MG/10ML IV SOLN
INTRAVENOUS | Status: DC | PRN
  Administered 2020-09-14: 50 mg via INTRAVENOUS

## 2020-09-14 SURGICAL SUPPLY — 53 items
APL SKNCLS STERI-STRIP NONHPOA (GAUZE/BANDAGES/DRESSINGS) ×1
BENZOIN TINCTURE PRP APPL 2/3 (GAUZE/BANDAGES/DRESSINGS) ×2 IMPLANT
BLADE SURG 15 STRL LF DISP TIS (BLADE) ×1 IMPLANT
BLADE SURG 15 STRL SS (BLADE) ×2
CLEANER TIP ELECTROSURG 2X2 (MISCELLANEOUS) ×2 IMPLANT
CNTNR URN SCR LID CUP LEK RST (MISCELLANEOUS) IMPLANT
CONT SPEC 4OZ STRL OR WHT (MISCELLANEOUS)
CORD BIPOLAR FORCEPS 12FT (ELECTRODE) ×2 IMPLANT
COVER SURGICAL LIGHT HANDLE (MISCELLANEOUS) ×2 IMPLANT
COVER WAND RF STERILE (DRAPES) ×2 IMPLANT
DRAIN CHANNEL 15F RND FF W/TCR (WOUND CARE) ×4 IMPLANT
DRAIN SNY 10 ROU (WOUND CARE) IMPLANT
DRAPE HALF SHEET 40X57 (DRAPES) IMPLANT
ELECT COATED BLADE 2.86 ST (ELECTRODE) ×2 IMPLANT
ELECT REM PT RETURN 9FT ADLT (ELECTROSURGICAL) ×2
ELECTRODE REM PT RTRN 9FT ADLT (ELECTROSURGICAL) ×1 IMPLANT
EVACUATOR SILICONE 100CC (DRAIN) ×2 IMPLANT
FORCEPS BIPOLAR SPETZLER 8 1.0 (NEUROSURGERY SUPPLIES) ×2 IMPLANT
GAUZE 4X4 16PLY RFD (DISPOSABLE) ×4 IMPLANT
GLOVE BIO SURGEON STRL SZ 6.5 (GLOVE) IMPLANT
GLOVE ECLIPSE 7.5 STRL STRAW (GLOVE) ×2 IMPLANT
GOWN STRL REUS W/ TWL LRG LVL3 (GOWN DISPOSABLE) ×1 IMPLANT
GOWN STRL REUS W/TWL LRG LVL3 (GOWN DISPOSABLE) ×2
KIT BASIN OR (CUSTOM PROCEDURE TRAY) ×2 IMPLANT
KIT LARYNGECTOMY PROVOX LPK 9 (KITS) ×2 IMPLANT
KIT TURNOVER KIT B (KITS) ×2 IMPLANT
NEEDLE PRECISIONGLIDE 27X1.5 (NEEDLE) IMPLANT
NS IRRIG 1000ML POUR BTL (IV SOLUTION) IMPLANT
PAD ARMBOARD 7.5X6 YLW CONV (MISCELLANEOUS) ×4 IMPLANT
PENCIL FOOT CONTROL (ELECTRODE) ×2 IMPLANT
SPECIMEN JAR MEDIUM (MISCELLANEOUS) ×2 IMPLANT
SPONGE LAP 18X18 RF (DISPOSABLE) IMPLANT
STAPLER VISISTAT 35W (STAPLE) ×2 IMPLANT
SUT CHROMIC 3 0 SH 27 (SUTURE) ×4 IMPLANT
SUT CHROMIC 4 0 PS 2 18 (SUTURE) ×6 IMPLANT
SUT ETHILON 3 0 PS 1 (SUTURE) IMPLANT
SUT SILK 2 0 REEL (SUTURE) IMPLANT
SUT SILK 2 0 SH CR/8 (SUTURE) ×2 IMPLANT
SUT SILK 3 0 REEL (SUTURE) IMPLANT
SUT SILK 4 0 REEL (SUTURE) ×2 IMPLANT
SUT VIC AB 3-0 FS2 27 (SUTURE) IMPLANT
SUT VIC AB 4-0 P-3 18XBRD (SUTURE) ×1 IMPLANT
SUT VIC AB 4-0 P3 18 (SUTURE) ×2
SUT VIC AB 4-0 PS2 27 (SUTURE) ×6 IMPLANT
SYR BULB IRRIG 60ML STRL (SYRINGE) IMPLANT
TAPE HY-TAPE 1X5Y PINK NS LF (GAUZE/BANDAGES/DRESSINGS) ×2 IMPLANT
TOWEL GREEN STERILE FF (TOWEL DISPOSABLE) ×2 IMPLANT
TRAY ENT MC OR (CUSTOM PROCEDURE TRAY) ×2 IMPLANT
TRAY FOLEY MTR SLVR 14FR STAT (SET/KITS/TRAYS/PACK) IMPLANT
TUBE FEEDING 10FR FLEXIFLO (MISCELLANEOUS) ×2 IMPLANT
TUBE TRACH SHILEY  6 DIST  CUF (TUBING) IMPLANT
TUBE TRACH SHILEY 8 DIST CUF (TUBING) IMPLANT
WATER STERILE IRR 1000ML POUR (IV SOLUTION) IMPLANT

## 2020-09-14 NOTE — ED Provider Notes (Signed)
[facility] EMERGENCY DEPARTMENT Provider Note   CSN: [id number] Arrival date & time: 09/14/20  1809     History Chief Complaint  Patient presents with  . Respiratory Distress    Marcus Collier is a 74 y.o. male with a past medical history of vocal cord cancer, radiation therapy, aortic stenosis, MI, who presents today for evaluation of shortness of breath.  History is obtained by patient, his wife, and chart review.  Due to patient's difficulty speaking his wife provides the majority of the history.  Patient today went for a Covid test in anticipation of his surgery tomorrow and when getting into the car felt like his breathing was bad.  They state that usually he will have similar episodes that lasts for 10 to 15 minutes and then go away however this 1 has not improved and he has been worsening over the past few hours.  He denies any fevers.  He does not normally wear oxygen.  Otherwise he is at his baseline.  No clear instigating event that patient or his wife can site.  He denies any significant new pain.  His symptoms are made worse with exertion.    According to provider who saw patient in triage patient was stridorous, tripoding with a new oxygen saturation of 90% on room air.  HPI     Past Medical History:  Diagnosis Date  . Acute myocardial infarction ([place])   . Aortic stenosis    mild AS 09/13/20 echo [facility])  . Cancer of vocal cord ([place]) 11/15/12   Left  . Family history of adverse reaction to anesthesia   . Headache    migraine  . Hoarseness of voice   . Hypertension   . 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 S/P radiation therapy 12/10/2012 - 01/20/2013   Vocal Cords/ 63 Gray/ 28 Fractions  . Unspecified malignant neoplasm of skin, site unspecified     Patient Active Problem List   Diagnosis Date Noted  . Pre-op evaluation 09/13/2020  . Coronary artery disease involving native coronary artery of native  heart without angina pectoris 09/13/2020  . 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 Lesion of vocal cord 11/26/2012  . Cancer of vocal cord ([place]) 11/26/2012    Past Surgical History:  Procedure Laterality Date  . Biopsy of Vocal cord  11/15/12   Right and Left  . Biospy mouth  11/15/12  . CARDIAC CATHETERIZATION     with stents   . CORONARY ANGIOPLASTY    . DIRECT LARYNGOSCOPY N/A 08/11/2020   Procedure: DIRECT LARYNGOSCOPY WITH BIOPSY;  Surgeon: Izora Gala, MD;  Location: [facility] OR;  Service: ENT;  Laterality: N/A;       Family History  Problem Relation Age of Onset  . Heart attack Mother   . Heart attack Father   . Hypertension Father     Social History   Tobacco Use  . Smoking status: Former Smoker    Packs/day: 1.00    Years: 52.00    Pack years: 52.00    Types: Cigarettes    Quit date: 2021    Years since quitting: 0.7  . Smokeless tobacco: Never Used  . Tobacco comment: stooped in 2003 started again and stopped 2021  Vaping Use  . Vaping Use: Never used  Substance Use Topics  . Alcohol use: Not Currently    Comment: social drinker  . Drug use: Never    Home Medications Prior to Admission  medications   Medication Sig Start Date End Date Taking? Authorizing Provider  acetaminophen (TYLENOL) 500 MG tablet Take 500 mg by mouth every 6 (six) hours as needed for headache.    [provider]  amLODipine (NORVASC) 10 MG tablet Take 10 mg by mouth daily.  07/06/20   [provider]  aspirin EC 81 MG tablet Take 81 mg by mouth every evening. Swallow whole.    [provider]  atorvastatin (LIPITOR) 80 MG tablet Take 80 mg by mouth every evening. 07/06/20   [provider]  hydrALAZINE (APRESOLINE) 25 MG tablet Take 25 mg by mouth 2 (two) times daily. 07/06/20   [provider]  quinapril (ACCUPRIL) 40 MG tablet Take 40 mg by mouth daily.  10/09/12   [provider]    Allergies     Penicillins  Review of Systems   Review of Systems  Constitutional: Negative for chills and fever.  HENT: Positive for voice change.   Respiratory: Positive for shortness of breath and stridor. Negative for chest tightness.   Cardiovascular: Negative for palpitations.  Gastrointestinal: Negative for abdominal pain and vomiting.  Neurological: Positive for speech difficulty (Due to shortness of breath). Negative for weakness and headaches.  All other systems reviewed and are negative.   Physical Exam Updated Vital Signs BP (!) 154/72   Pulse (!) 101   Temp 98.6 F (37 C) (Oral)   Resp 20   SpO2 93%   Physical Exam Vitals and nursing note reviewed.  Constitutional:      General: He is in acute distress.     Appearance: He is well-developed. He is not diaphoretic.     Comments: Voice is quiet and hoarse.   HENT:     Head: Normocephalic and atraumatic.  Eyes:     General: No scleral icterus.       Right eye: No discharge.        Left eye: No discharge.     Conjunctiva/sclera: Conjunctivae normal.  Cardiovascular:     Rate and Rhythm: Regular rhythm. Tachycardia present.  Pulmonary:     Effort: Accessory muscle usage (Mild) and respiratory distress (Mild) present.     Breath sounds: Stridor and transmitted upper airway sounds present. No wheezing.     Comments: Patient had been placed on oxygen which has apparently improved his tripoding and accessory muscle usage and respiratory distress.  He is sitting upright however only has mild accessory muscle usage.  Abdominal:     General: There is no distension.  Musculoskeletal:        General: No deformity.     Cervical back: Normal range of motion and neck supple.  Skin:    General: Skin is warm and dry.  Neurological:     Mental Status: He is alert.     Motor: No abnormal muscle tone.     Comments: Patient is awake and alert.  Is able to answer questions in 1-2 words.  Psychiatric:        Mood and Affect: Mood normal.         Behavior: Behavior normal.     ED Results / Procedures / Treatments   Labs (all labs ordered are listed, but only abnormal results are displayed) Labs Reviewed  COMPREHENSIVE METABOLIC PANEL - Abnormal; Notable for the following components:      Result Value   Sodium 130 (*)    Chloride 96 (*)    Glucose, Bld 274 (*)    BUN  25 (*)    Albumin 2.9 (*)    ALT 70 (*)    All other components within normal limits  CBC WITH DIFFERENTIAL/PLATELET - Abnormal; Notable for the following components:   WBC 23.2 (*)    RBC 3.80 (*)    Hemoglobin 11.6 (*)    HCT 35.6 (*)    Neutro Abs 20.3 (*)    Monocytes Absolute 1.5 (*)    Abs Immature Granulocytes 0.20 (*)    All other components within normal limits  I-STAT CHEM 8, ED - Abnormal; Notable for the following components:   Sodium 134 (*)    BUN 25 (*)    Creatinine, Ser 0.50 (*)    Glucose, Bld 283 (*)    Calcium, Ion 1.10 (*)    Hemoglobin 11.6 (*)    HCT 34.0 (*)    All other components within normal limits  SARS CORONAVIRUS 2 BY RT PCR [facility] ORDER, [place] [facility])    EKG EKG Interpretation  Date/Time:  Tuesday September 14 2020 18:24:36 EDT Ventricular Rate:  108 PR Interval:  146 QRS Duration: 82 QT Interval:  318 QTC Calculation: 426 R Axis:   20 Text Interpretation: Sinus tachycardia Possible Lateral infarct , age undetermined Inferior infarct , age undetermined Abnormal ECG Since last tracing rate faster Confirmed by Isla Pence [id number]) on 09/14/2020 9:36:06 PM   Radiology DG Chest Portable 1 View  Result Date: 09/14/2020 CLINICAL DATA:  Respiratory distress EXAM: PORTABLE CHEST 1 VIEW COMPARISON:  08/05/2020 FINDINGS: Single frontal view of the chest demonstrates an unremarkable cardiac silhouette. Background emphysema again noted without focal airspace disease, effusion, or pneumothorax. No acute bony abnormalities. IMPRESSION: 1. Emphysema.  No acute process. Electronically Signed    By: Randa Ngo M.D.   On: 09/14/2020 19:03    Procedures .Critical Care Performed by: Lorin Glass, PA-C Authorized by: Lorin Glass, PA-C   Critical care provider statement:    Critical care time (minutes):  45   Critical care time was exclusive of:  Separately billable procedures and treating other patients and teaching time   Critical care was necessary to treat or prevent imminent or life-threatening deterioration of the following conditions:  Respiratory failure (Airway compromise. )   Critical care was time spent personally by me on the following activities:  Discussions with consultants, evaluation of patient's response to treatment, examination of patient, ordering and performing treatments and interventions, ordering and review of laboratory studies, ordering and review of radiographic studies, pulse oximetry, re-evaluation of patient's condition, obtaining history from patient or surrogate and review of old charts Comments:     Patient required to be emergently taken to the operating room for tracheostomy   (including critical care time)  Medications Ordered in ED Medications  dexamethasone (DECADRON) injection 10 mg (10 mg Intravenous Given 09/14/20 1934)  Racepinephrine HCl 2.25 % nebulizer solution 0.5 mL (0.5 mLs Nebulization Given 09/14/20 1934)  lactated ringers bolus 250 mL (0 mLs Intravenous Stopped 09/14/20 2059)    ED Course  I have reviewed the triage vital signs and the nursing notes.  Pertinent labs & imaging results that were available during my care of the patient were reviewed by me and considered in my medical decision making (see chart for details).  Clinical Course as of Sep 14 2208  Tue Sep 14, 2020  1900 I spoke with Dr. Constance Holster of ENT, he is well aware of patient is he is scheduled for total laryngectomy tomorrow.  He states no additional imaging is needed.  Is in agreement with plan of trying racemic epi and Decadron.  If after this  patient has significant improvement and is no longer requiring oxygen he can go home for his surgery tomorrow, however if he does not significantly improve will call Dr. Constance Holster back.   [EH]  2018 Patient is reevaluated, his overall condition is unchanged.  It has been under an hour since he got the medications, which we will continue to monitor.   [EH]  2052 I spoke with Dr. Constance Holster who will come see patient.    [EH]  2105 Patient re-evaluated, he is now requiring 4 LPM oxygen to maintain over 90%  Humidifier has been placed.  Patient reports no relief with humidifier.    [EH]  2131 Chart review shows he was 94% yesterday on room air.    [EH]  2208 Dr. Constance Holster came to see patient, and took patient emergently to the OR for his laryngectomy tonight instead of tomorrow.  He states he will admit the patient.   [EH]    Clinical Course User Index [EH] Ollen Gross   MDM Rules/Calculators/A&P                         Patient is a 73 year old man with vocal cord cancer who presents today for evaluation of stridor.  He is scheduled to undergo laryngectomy tomorrow with Dr. Constance Holster of ENT.  When he was seen in triage he was reportedly tripoding with significant respiratory distress.  Chart review shows that he, at least yesterday was 94% on room air.  Here patient is currently requiring increasing amounts of oxygen from 2 up to 4 L while in the emergency room to maintain his saturation in the 90s.  He has significant stridor on my initial exam.  Chest x-ray was obtained without significant acute abnormality.  Dr. Constance Holster was consulted early on in patient's ED course, he agreed with plan for racemic epi and IV steroids.  Labs are obtained and reviewed he has a white count of 23.2, no clear source and patient does not reportedly take steroids at home.  CMP shows mild hyperglycemia at 274.  Covid test is negative.  After treatment patient did not have improvement in his symptoms, and continued to worsen  while in the emergency room.    Dr. Constance Holster was reconsulted.  He came to see patient and took him emergently to the operating room for tracheostomy and laryngectomy.    This patient was seen as a shared visit with Dr. Gilford Raid.   Note: Portions of this report may have been transcribed using voice recognition software. Every effort was made to ensure accuracy; however, inadvertent computerized transcription errors may be present  Final Clinical Impression(s) / ED Diagnoses Final diagnoses:  Stridor  Cancer of vocal cord [facility])    Rx / DC Orders ED Discharge Orders    None       Ollen Gross 09/14/20 2255    Isla Pence, MD 09/14/20 2313

## 2020-09-14 NOTE — ED Provider Notes (Signed)
Patient evaluated in triage at request of triage RN.  He is presenting with respiratory distress x1 day.  He states has progressively worsened throughout the day.  He has a history of vocal cord cancer and is scheduled for a laryngectomy tomorrow with Dr. Constance Holster.  Patient's wife at the bedside states they were concerned as his symptoms were worsening throughout the day.  No medications given prior to arrival. IV decadron ordered.  On exam he is stridorous.  He is tripoding.  Oxygen saturation is 90% on room air.   Patient taken to resuscitation bay and report given to oncoming provider.   Marcus Collier 09/14/20 1850    Isla Pence, MD 09/14/20 2225

## 2020-09-14 NOTE — ED Triage Notes (Signed)
Pt presents to ED with resp distress since last night worse today. 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 Pt tripoding and using accessory muscles.

## 2020-09-14 NOTE — Progress Notes (Signed)
Echocardiogram reviewed. Mild narrowing of aortic valve. Normal heart function. Okay to proceed with surgery.  Please check who to send pre-op letter.  Thanks MJP

## 2020-09-14 NOTE — ED Notes (Signed)
Dr Constance Holster at bedside

## 2020-09-14 NOTE — H&P (Signed)
Marcus Collier is an 74 y.o. male.   Chief Complaint: Stridor HPI: Patient known to me, scheduled for total laryngectomy tomorrow afternoon.  Earlier today he developed worsening shortness of breath and noisy breathing.  He has been in emergency department for several hours.  He has received racemic epi treatments and steroids/oxygen but continues to have noisy breathing and difficulty keeping saturations above 90%.  Has a history of laryngeal cancer treated with radiation several years ago, recently diagnosed with a new laryngeal cancer.  He had cardiology work-up yesterday and was cleared for surgery.  Past Medical History:  Diagnosis Date  . Acute myocardial infarction ([place])   . Aortic stenosis    mild AS 09/13/20 echo [facility])  . Cancer of vocal cord ([place]) 11/15/12   Left  . Family history of adverse reaction to anesthesia   . Headache    migraine  . Hoarseness of voice   . Hypertension   . 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 S/P radiation therapy 12/10/2012 - 01/20/2013   Vocal Cords/ 63 Gray/ 28 Fractions  . Unspecified malignant neoplasm of skin, site unspecified     Past Surgical History:  Procedure Laterality Date  . Biopsy of Vocal cord  11/15/12   Right and Left  . Biospy mouth  11/15/12  . CARDIAC CATHETERIZATION     with stents   . CORONARY ANGIOPLASTY    . DIRECT LARYNGOSCOPY N/A 08/11/2020   Procedure: DIRECT LARYNGOSCOPY WITH BIOPSY;  Surgeon: Izora Gala, MD;  Location: [facility] OR;  Service: ENT;  Laterality: N/A;    Family History  Problem Relation Age of Onset  . Heart attack Mother   . Heart attack Father   . Hypertension Father    Social History:  reports that he quit smoking about 8 months ago. His smoking use included cigarettes. He has a 52.00 pack-year smoking history. He has never used smokeless tobacco. He reports previous alcohol use. He reports that he does not use drugs.  Allergies:  Allergies  Allergen Reactions   . Penicillins Other (See Comments)    Unknown reaction/ Childhood    (Not in a hospital admission)   Results for orders placed or performed during the hospital encounter of 09/14/20 (from the past 48 hour(s))  SARS Coronavirus 2 by RT PCR (hospital order, performed in [facility] hospital lab) Nasopharyngeal Nasopharyngeal Swab     Status: None   Collection Time: 09/14/20  6:43 PM   Specimen: Nasopharyngeal Swab  Result Value Ref Range   SARS Coronavirus 2 NEGATIVE NEGATIVE    Comment: (NOTE) SARS-CoV-2 target nucleic acids are NOT DETECTED.  The SARS-CoV-2 RNA is generally detectable in upper and lower respiratory specimens during the acute phase of infection. The lowest concentration of SARS-CoV-2 viral copies this assay can detect is 250 copies / mL. A negative result does not preclude SARS-CoV-2 infection and should not be used as the sole basis for treatment or other patient management decisions.  A negative result may occur with improper specimen collection / handling, submission of specimen other than nasopharyngeal swab, presence of viral mutation(s) within the areas targeted by this assay, and inadequate number of viral copies (<250 copies / mL). A negative result must be combined with clinical observations, patient history, and epidemiological information.  Fact Sheet for Patients:   StrictlyIdeas.no  Fact Sheet for Healthcare Providers: BankingDealers.co.za  This test is not yet approved or  cleared by the [place] FDA and has been authorized  for detection and/or diagnosis of SARS-CoV-2 by FDA under an Emergency Use Authorization (EUA).  This EUA will remain in effect (meaning this test can be used) for the duration of the COVID-19 declaration under Section 564(b)(1) of the Act, 21 U.S.C. section 360bbb-3(b)(1), unless the authorization is terminated or revoked sooner.  Performed at [facility], [place] [address], [place], [place] [address]   Comprehensive metabolic panel     Status: Abnormal   Collection Time: 09/14/20  6:45 PM  Result Value Ref Range   Sodium 130 (L) 135 - 145 mmol/L   Potassium 4.3 3.5 - 5.1 mmol/L   Chloride 96 (L) 98 - 111 mmol/L   CO2 22 22 - 32 mmol/L   Glucose, Bld 274 (H) 70 - 99 mg/dL    Comment: Glucose reference range applies only to samples taken after fasting for at least 8 hours.   BUN 25 (H) 8 - 23 mg/dL   Creatinine, Ser 0.75 0.61 - 1.24 mg/dL   Calcium 8.9 8.9 - 10.3 mg/dL   Total Protein 7.1 6.5 - 8.1 g/dL   Albumin 2.9 (L) 3.5 - 5.0 g/dL   AST 38 15 - 41 U/L   ALT 70 (H) 0 - 44 U/L   Alkaline Phosphatase 121 38 - 126 U/L   Total Bilirubin 0.7 0.3 - 1.2 mg/dL   GFR calc non Af Amer >60 >60 mL/min   GFR calc Af Amer >60 >60 mL/min   Anion gap 12 5 - 15    Comment: Performed at [facility] [address] [place], [place] [address]  CBC with Differential     Status: Abnormal   Collection Time: 09/14/20  6:45 PM  Result Value Ref Range   WBC 23.2 (H) 4.0 - 10.5 K/uL   RBC 3.80 (L) 4.22 - 5.81 MIL/uL   Hemoglobin 11.6 (L) 13.0 - 17.0 g/dL   HCT 35.6 (L) 39 - 52 %   MCV 93.7 80.0 - 100.0 fL   MCH 30.5 26.0 - 34.0 pg   MCHC 32.6 30.0 - 36.0 g/dL   RDW 12.9 11.5 - 15.5 %   Platelets 383 150 - 400 K/uL   nRBC 0.0 0.0 - 0.2 %   Neutrophils Relative % 88 %   Neutro Abs 20.3 (H) 1.7 - 7.7 K/uL   Lymphocytes Relative 5 %   Lymphs Abs 1.2 0.7 - 4.0 K/uL   Monocytes Relative 6 %   Monocytes Absolute 1.5 (H) 0 - 1 K/uL   Eosinophils Relative 0 %   Eosinophils Absolute 0.0 0 - 0 K/uL   Basophils Relative 0 %   Basophils Absolute 0.1 0 - 0 K/uL   Immature Granulocytes 1 %   Abs Immature Granulocytes 0.20 (H) 0.00 - 0.07 K/uL    Comment: Performed at [facility] [address], [place],  [address]  I-stat chem 8, ED (not at [facility] or [facility])     Status: Abnormal   Collection Time: 09/14/20  7:24 PM  Result Value Ref Range   Sodium  134 (L) 135 - 145 mmol/L   Potassium 4.3 3.5 - 5.1 mmol/L   Chloride 101 98 - 111 mmol/L   BUN 25 (H) 8 - 23 mg/dL   Creatinine, Ser 0.50 (L) 0.61 - 1.24 mg/dL   Glucose, Bld 283 (H) 70 - 99 mg/dL    Comment: Glucose reference range applies only to samples taken after fasting for at least 8 hours.  Calcium, Ion 1.10 (L) 1.15 - 1.40 mmol/L   TCO2 26 22 - 32 mmol/L   Hemoglobin 11.6 (L) 13.0 - 17.0 g/dL   HCT 34.0 (L) 39 - 52 %   DG Chest Portable 1 View  Result Date: 09/14/2020 CLINICAL DATA:  Respiratory distress EXAM: PORTABLE CHEST 1 VIEW COMPARISON:  08/05/2020 FINDINGS: Single frontal view of the chest demonstrates an unremarkable cardiac silhouette. Background emphysema again noted without focal airspace disease, effusion, or pneumothorax. No acute bony abnormalities. IMPRESSION: 1. Emphysema.  No acute process. Electronically Signed   By: Randa Ngo M.D.   On: 09/14/2020 19:03   PCV ECHOCARDIOGRAM COMPLETE  Result Date: 09/14/2020 Echocardiogram 09/13/2020: Small left ventricle cavity. Normal wall thickness. Normal global wall motion. Normal LV systolic function with EF 59%. Doppler evidence of grade I (impaired) diastolic dysfunction, normal LAP. Aortic valve not well visualized. Mild aortic stenosis. Vmax 2.3 m/sec, mean PG 13 mmHg, AVA 1.5 cm2. Normal right atrial pressure.    ROS: otherwise negative  Blood pressure (!) 154/72, pulse (!) 101, temperature 98.6 F (37 C), temperature source Oral, resp. rate 20, SpO2 93 %.  PHYSICAL EXAM: Overall appearance:  Healthy appearing, in no distress currently, but significant biphasic stridor. Head:  Normocephalic, atraumatic. Ears: External ears look healthy. Nose: External nose is healthy in appearance. Internal nasal exam free of any lesions or obstruction. Oral Cavity/pharynx:  There are no mucosal lesions or masses identified. Hypopharynx/Larynx: Deferred Neuro:  No identifiable neurologic deficits. Neck: No palpable neck  masses.  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  Options were discussed with the patient and his wife.  1 option would be to perform an emergency tracheostomy tonight and then continue on with laryngectomy tomorrow.  That makes the laryngectomy much more difficult and higher chance of complications and lower chance of clear margins.  Other option is to go ahead and perform an awake tracheostomy tonight followed by an immediate total laryngectomy.  I think that is the better option and he and his wife both agree.  Risks and benefits were discussed.  We will proceed with surgery this evening.  Izora Gala 09/14/2020, 10:10 PM

## 2020-09-14 NOTE — Anesthesia Preprocedure Evaluation (Addendum)
Anesthesia Evaluation  Patient identified by MRN, date of birth, ID band Patient awake    Reviewed: Allergy & Precautions, NPO status , Patient's Chart, lab work & pertinent test results  Airway Mallampati: II  TM Distance: >3 FB Neck ROM: Limited    Dental   Pulmonary former smoker,    Pulmonary exam normal        Cardiovascular hypertension, Pt. on medications + CAD, + Past MI and + Cardiac Stents  Normal cardiovascular exam+ Valvular Problems/Murmurs AS      Neuro/Psych    GI/Hepatic   Endo/Other    Renal/GU      Musculoskeletal   Abdominal   Peds  Hematology   Anesthesia Other Findings   Reproductive/Obstetrics                            Anesthesia Physical Anesthesia Plan  ASA: III and emergent  Anesthesia Plan: General   Post-op Pain Management:    Induction: Intravenous  PONV Risk Score and Plan:   Airway Management Planned: Tracheostomy  Additional Equipment:   Intra-op Plan:   Post-operative Plan: Extubation in OR  Informed Consent: I have reviewed the patients History and Physical, chart, labs and discussed the procedure including the risks, benefits and alternatives for the proposed anesthesia with the patient or authorized representative who has indicated his/her understanding and acceptance.       Plan Discussed with: CRNA and Surgeon  Anesthesia Plan Comments: (PAT note written 09/14/2020 by Myra Gianotti, PA-C.  Awake tracheostomy due to stridor, then GA for Laryngectomy. )       Anesthesia Quick Evaluation

## 2020-09-14 NOTE — Progress Notes (Signed)
Anesthesia Chart Review: SAME DAY WORK-UP   Case: [id number] Date/Time: 09/15/20 1145   Procedure: LARYNGECTOMY Total (N/A ) - NEEDS RNFA   Anesthesia type: General   Pre-op diagnosis: laryngeal cancer   Location: [facility] OR ROOM 08 / [facility]   Surgeons: Izora Gala, MD      DISCUSSION: Patient is a 74 year old male scheduled for the above procedure. He has known left vocal cord cancer s/p radiation ~ 2013/2014, but with more recent hoarseness, sore throat, and right ear pain. S/p laryngoscopy with biopsy on 08/10/20 with pathology positive for squamous cell carcinoma, SCC in situ.  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 Syncopal episode 04/15/19, suspected vasovagal, possibly dehydration contributing. ED work-up unremarkable although some intermittent bradycardia (HR 47), so b-blocker held and advised PCP follow-up.   He had preoperative cardiology evaluation by Dr. Virgina Jock on 09/13/20:  "Preoperative stratification: Known CAD, without any ongoing symptoms of angina.  Functional capacity is limited at baseline.  However, I do not think ischemia testing will mitigate his perioperative cardiac risk.  As such, acceptable perioperative cardiac risk. Continue aspirin, statin.  I will obtain echocardiogram for baseline EF evaluation." - Following 09/13/20 echo, he wrote, "Echocardiogram reviewed. Mild narrowing of aortic valve. Normal heart function. Okay to proceed with surgery."  2nd [place] COVID-19 vaccine 02/05/20.  09/14/2020 presurgical COVID-19 test is in process. Anesthesia team to evaluate on the day of surgery.   VS: 09/13/20: BP: 122/68  Pulse: (!) 101  Resp: 16  SpO2: 94%    PROVIDERS: [name], MD is PCP  Vernell Leep, MD is cardiologist Eppie Gibson, MD is  RAD-ONC   LABS: For day of surgery.    IMAGES: IMAGES: CT soft tissue neck 08/05/20: IMPRESSION: - Likely post radiation changes in the hypopharynx and larynx. Possible superimposed recurrent malignancy at the level of the glottis and vestibule. - No enlarged or necrotic lymph nodes identified. - Atherosclerotic plaque at the ICA origins with ulceration on the left.  CT Chest 08/05/20 (ordered by Dr. Constance Holster): IMPRESSION: 1. Partially visualized proximal abdominal aortic aneurysm measuring up to 3.4 cm in anterior-posterior dimension. As the entirety of the abdominal aorta is not visualized on this exam, follow-up CT abdomen and pelvis with IV contrast may be useful. 2. Multiple bilateral pulmonary nodules, largest part solid nodule measuring 8 mm in the right lower lobe. Follow-up non-contrast CT recommended at 3-6 months to confirm persistence. If unchanged, and solid component remains <6 mm, annual CT is recommended until 5 years of stability has been established. If persistent these nodules should be considered highly suspicious if the solid component of the nodule is 6 mm or greater in size and enlarging. This recommendation follows the consensus statement: Guidelines for Management of Incidental Pulmonary Nodules Detected on CT Images: From the Fleischner Society 2017; Radiology 2017; 284:228-243. 3. Aortic Atherosclerosis (ICD10-I70.0) and Emphysema (ICD10-J43.9).   EKG: EKG 09/13/2020: Sinus tachycardia 106 bpm Bi-atrial enlargement Old inferior infarct  Nonspecific T-abnormality   CV: Echocardiogram 09/13/2020:  Small left ventricle cavity. Normal wall thickness. Normal global wall  motion. Normal LV systolic function with EF 59%. Doppler evidence of grade  I (impaired) diastolic dysfunction, normal LAP.  Aortic valve not well visualized. Mild aortic stenosis. Vmax 2.3 m/sec,  mean PG 13 mmHg, AVA 1.5 cm2.  Normal right atrial pressure.  Past Medical  History:  Diagnosis Date  . Acute myocardial infarction ([place])   . Aortic stenosis    mild AS 09/13/20 echo [facility])  . Cancer of vocal cord ([place]) 11/15/12   Left  . Family history of adverse reaction to anesthesia   . Headache    migraine  . Hoarseness of voice   . Hypertension   . 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 S/P radiation therapy 12/10/2012 - 01/20/2013   Vocal Cords/ 63 Gray/ 28 Fractions  . Unspecified malignant neoplasm of skin, site unspecified     Past Surgical History:  Procedure Laterality Date  . Biopsy of Vocal cord  11/15/12   Right and Left  . Biospy mouth  11/15/12  . CARDIAC CATHETERIZATION     with stents   . CORONARY ANGIOPLASTY    . DIRECT LARYNGOSCOPY N/A 08/11/2020   Procedure: DIRECT LARYNGOSCOPY WITH BIOPSY;  Surgeon: Izora Gala, MD;  Location: [facility];  Service: ENT;  Laterality: N/A;    MEDICATIONS: No current facility-administered medications for this encounter.   Marland Kitchen acetaminophen (TYLENOL) 500 MG tablet  . amLODipine (NORVASC) 10 MG tablet  . aspirin EC 81 MG tablet  . atorvastatin (LIPITOR) 80 MG tablet  . hydrALAZINE (APRESOLINE) 25 MG tablet  . quinapril (ACCUPRIL) 40 MG tablet  ASA is on hold for surgery.   Myra Gianotti, PA-C Surgical Short Stay/Anesthesiology [facility] Phone [phone] [facility] Phone [phone] 09/14/2020 2:38 PM

## 2020-09-15 ENCOUNTER — Inpatient Hospital Stay (HOSPITAL_COMMUNITY): Payer: Medicare Other

## 2020-09-15 ENCOUNTER — Encounter (HOSPITAL_COMMUNITY): Payer: Self-pay | Admitting: Otolaryngology

## 2020-09-15 LAB — MAGNESIUM: Magnesium: 1.9 mg/dL (ref 1.7–2.4)

## 2020-09-15 LAB — CBC
HCT: 31.7 % — ABNORMAL LOW (ref 39.0–52.0)
Hemoglobin: 10.4 g/dL — ABNORMAL LOW (ref 13.0–17.0)
MCH: 30.3 pg (ref 26.0–34.0)
MCHC: 32.8 g/dL (ref 30.0–36.0)
MCV: 92.4 fL (ref 80.0–100.0)
Platelets: 274 10*3/uL (ref 150–400)
RBC: 3.43 MIL/uL — ABNORMAL LOW (ref 4.22–5.81)
RDW: 12.9 % (ref 11.5–15.5)
WBC: 17.1 10*3/uL — ABNORMAL HIGH (ref 4.0–10.5)
nRBC: 0 % (ref 0.0–0.2)

## 2020-09-15 LAB — BASIC METABOLIC PANEL
Anion gap: 10 (ref 5–15)
BUN: 15 mg/dL (ref 8–23)
CO2: 25 mmol/L (ref 22–32)
Calcium: 8.5 mg/dL — ABNORMAL LOW (ref 8.9–10.3)
Chloride: 100 mmol/L (ref 98–111)
Creatinine, Ser: 0.65 mg/dL (ref 0.61–1.24)
GFR calc Af Amer: >60 mL/min (ref >60)
GFR calc non Af Amer: >60 mL/min (ref >60)
Glucose, Bld: 162 mg/dL — ABNORMAL HIGH (ref 70–99)
Potassium: 4.6 mmol/L (ref 3.5–5.1)
Sodium: 135 mmol/L (ref 135–145)

## 2020-09-15 LAB — GLUCOSE, CAPILLARY
Glucose-Capillary: 145 mg/dL — ABNORMAL HIGH (ref 70–99)
Glucose-Capillary: 181 mg/dL — ABNORMAL HIGH (ref 70–99)

## 2020-09-15 LAB — MRSA PCR SCREENING: MRSA by PCR: NEGATIVE

## 2020-09-15 LAB — PHOSPHORUS: Phosphorus: 3.5 mg/dL (ref 2.5–4.6)

## 2020-09-15 MED ORDER — PHENYLEPHRINE 40 MCG/ML (10ML) SYRINGE FOR IV PUSH (FOR BLOOD PRESSURE SUPPORT)
PREFILLED_SYRINGE | INTRAVENOUS | Status: AC
  Filled 2020-09-15: qty 10

## 2020-09-15 MED ORDER — OSMOLITE 1.5 CAL PO LIQD
1000.0000 mL | ORAL | Status: AC
  Administered 2020-09-15 – 2020-09-17 (×3): 1000 mL
  Filled 2020-09-15 (×3): qty 1000

## 2020-09-15 MED ORDER — ORAL CARE MOUTH RINSE
15.0000 mL | OROMUCOSAL | Status: DC
  Administered 2020-09-15 – 2020-09-24 (×73): 15 mL via OROMUCOSAL

## 2020-09-15 MED ORDER — ONDANSETRON HCL 4 MG/2ML IJ SOLN
INTRAMUSCULAR | Status: AC
  Filled 2020-09-15: qty 2

## 2020-09-15 MED ORDER — FREE WATER
100.0000 mL | Freq: Four times a day (QID) | Status: AC
  Administered 2020-09-15 – 2020-09-17 (×9): 100 mL

## 2020-09-15 MED ORDER — SODIUM CHLORIDE 0.9 % IV SOLN: INTRAVENOUS | Status: DC

## 2020-09-15 MED ORDER — HYDRALAZINE HCL 25 MG PO TABS
25.0000 mg | ORAL_TABLET | Freq: Two times a day (BID) | ORAL | Status: DC
  Administered 2020-09-15 – 2020-09-24 (×19): 25 mg
  Filled 2020-09-15 (×19): qty 1

## 2020-09-15 MED ORDER — LISINOPRIL 40 MG PO TABS
40.0000 mg | ORAL_TABLET | Freq: Every day | ORAL | Status: DC
  Administered 2020-09-15 – 2020-09-24 (×10): 40 mg via NASOGASTRIC
  Filled 2020-09-15 (×5): qty 1
  Filled 2020-09-15: qty 2
  Filled 2020-09-15 (×5): qty 1

## 2020-09-15 MED ORDER — ADULT MULTIVITAMIN W/MINERALS CH
1.0000 | ORAL_TABLET | Freq: Every day | ORAL | Status: DC
  Administered 2020-09-15 – 2020-09-24 (×10): 1
  Filled 2020-09-15 (×10): qty 1

## 2020-09-15 MED ORDER — ROCURONIUM BROMIDE 10 MG/ML (PF) SYRINGE
PREFILLED_SYRINGE | INTRAVENOUS | Status: AC
  Filled 2020-09-15: qty 10

## 2020-09-15 MED ORDER — CLINDAMYCIN PHOSPHATE 600 MG/50ML IV SOLN
600.0000 mg | Freq: Three times a day (TID) | INTRAVENOUS | Status: AC
  Administered 2020-09-15 – 2020-09-16 (×6): 600 mg via INTRAVENOUS
  Filled 2020-09-15 (×7): qty 50

## 2020-09-15 MED ORDER — INFLUENZA VAC A&B SA ADJ QUAD 0.5 ML IM PRSY
0.5000 mL | PREFILLED_SYRINGE | INTRAMUSCULAR | Status: DC
  Filled 2020-09-15: qty 0.5

## 2020-09-15 MED ORDER — CHLORHEXIDINE GLUCONATE CLOTH 2 % EX PADS
6.0000 | MEDICATED_PAD | Freq: Every day | CUTANEOUS | Status: DC
  Administered 2020-09-16 – 2020-09-19 (×4): 6 via TOPICAL

## 2020-09-15 MED ORDER — PROMETHAZINE HCL 25 MG RE SUPP
25.0000 mg | Freq: Four times a day (QID) | RECTAL | Status: DC | PRN
  Filled 2020-09-15: qty 1

## 2020-09-15 MED ORDER — AMLODIPINE BESYLATE 10 MG PO TABS
10.0000 mg | ORAL_TABLET | Freq: Every day | ORAL | Status: DC
  Administered 2020-09-15 – 2020-09-24 (×10): 10 mg via NASOGASTRIC
  Filled 2020-09-15 (×10): qty 1

## 2020-09-15 MED ORDER — PROMETHAZINE HCL 25 MG PO TABS: 25.0000 mg | ORAL_TABLET | Freq: Four times a day (QID) | ORAL | Status: DC | PRN

## 2020-09-15 MED ORDER — PROSOURCE TF PO LIQD
45.0000 mL | Freq: Three times a day (TID) | ORAL | Status: DC
  Administered 2020-09-15 – 2020-09-23 (×27): 45 mL
  Filled 2020-09-15 (×30): qty 45

## 2020-09-15 MED ORDER — ATORVASTATIN CALCIUM 80 MG PO TABS
80.0000 mg | ORAL_TABLET | Freq: Every evening | ORAL | Status: DC
  Administered 2020-09-15 – 2020-09-23 (×9): 80 mg via NASOGASTRIC
  Filled 2020-09-15 (×10): qty 1

## 2020-09-15 MED ORDER — PNEUMOCOCCAL VAC POLYVALENT 25 MCG/0.5ML IJ INJ
0.5000 mL | INJECTION | INTRAMUSCULAR | Status: AC
  Administered 2020-09-19: 0.5 mL via INTRAMUSCULAR
  Filled 2020-09-15 (×2): qty 0.5

## 2020-09-15 MED ORDER — ONDANSETRON HCL 4 MG/2ML IJ SOLN
INTRAMUSCULAR | Status: DC | PRN
  Administered 2020-09-15: 4 mg via INTRAVENOUS

## 2020-09-15 MED ORDER — CHLORHEXIDINE GLUCONATE 0.12% ORAL RINSE (MEDLINE KIT)
15.0000 mL | Freq: Two times a day (BID) | OROMUCOSAL | Status: DC
  Administered 2020-09-15 – 2020-09-24 (×17): 15 mL via OROMUCOSAL

## 2020-09-15 MED ORDER — SUCCINYLCHOLINE CHLORIDE 200 MG/10ML IV SOSY
PREFILLED_SYRINGE | INTRAVENOUS | Status: AC
  Filled 2020-09-15: qty 10

## 2020-09-15 MED ORDER — MORPHINE SULFATE (PF) 2 MG/ML IV SOLN
2.0000 mg | INTRAVENOUS | Status: DC | PRN
  Administered 2020-09-15: 2 mg via INTRAVENOUS
  Filled 2020-09-15: qty 2

## 2020-09-15 MED ORDER — LIDOCAINE 2% (20 MG/ML) 5 ML SYRINGE
INTRAMUSCULAR | Status: AC
  Filled 2020-09-15: qty 5

## 2020-09-15 MED ORDER — HYDROCODONE-ACETAMINOPHEN 7.5-325 MG/15ML PO SOLN
10.0000 mL | ORAL | Status: DC | PRN
  Administered 2020-09-15 (×3): 15 mL
  Filled 2020-09-15 (×3): qty 15

## 2020-09-15 MED ORDER — SUGAMMADEX SODIUM 200 MG/2ML IV SOLN
INTRAVENOUS | Status: DC | PRN
  Administered 2020-09-15: 200 mg via INTRAVENOUS

## 2020-09-15 MED ORDER — BACITRACIN ZINC 500 UNIT/GM EX OINT
1.0000 "application " | TOPICAL_OINTMENT | Freq: Three times a day (TID) | CUTANEOUS | Status: DC
  Administered 2020-09-15 – 2020-09-24 (×28): 1 via TOPICAL
  Filled 2020-09-15 (×2): qty 28.35
  Filled 2020-09-15: qty 28.4
  Filled 2020-09-15: qty 28.35

## 2020-09-15 NOTE — Anesthesia Postprocedure Evaluation (Signed)
Anesthesia Post Note  Patient: Marcus Collier  Procedure(s) Performed: LARYNGECTOMY Total (N/A )     Patient location during evaluation: PACU Anesthesia Type: General Level of consciousness: awake and alert Pain management: pain level controlled Vital Signs Assessment: post-procedure vital signs reviewed and stable Respiratory status: spontaneous breathing, nonlabored ventilation, respiratory function stable and patient connected to nasal cannula oxygen Cardiovascular status: blood pressure returned to baseline and stable Postop Assessment: no apparent nausea or vomiting Anesthetic complications: no   No complications documented.  Last Vitals:  Vitals:   09/15/20 0400 09/15/20 0500  BP: (!) 160/78 (!) 152/69  Pulse: 80 69  Resp: (!) 29 17  Temp: 36.9 C   SpO2: 98% 99%    Last Pain:  Vitals:   09/15/20 0400  TempSrc: Oral  PainSc: 0-No pain                 Gad Aymond DAVID

## 2020-09-15 NOTE — OR Nursing (Signed)
[facility] life Larytube 9/36 standard ID 10.5 mm OD 13.5 mm L 19mm

## 2020-09-15 NOTE — Progress Notes (Signed)
Initial Nutrition Assessment  DOCUMENTATION CODES:   Severe malnutrition in context of chronic illness  INTERVENTION:   Initiate tube feeding via Cortrak tube (tip gastric): Osmolite 1.5 at 25 ml/h and increase by 10 ml every 8 hours to goal rate of 55 ml/h (1320 ml per day) Prosource TF 45 ml TID  Provides 2100 kcal, 115 gm protein, 1003 ml free water daily  MVI with minerals   100 ml free water every 6 hours Total free water per tube: 1403 ml    Monitor magnesium and phosphorus every 12 hours x 4 occurences or until normalized, MD to replete as needed, as pt is at risk for refeeding syndrome given severe malnutrition.   NUTRITION DIAGNOSIS:   Severe Malnutrition related to chronic illness (cancer) as evidenced by severe fat depletion, moderate fat depletion, severe muscle depletion, moderate muscle depletion, energy intake < or equal to 75% for > or equal to 1 month, percent weight loss. (23% weight loss < 5 months)  GOAL:   Patient will meet greater than or equal to 90% of their needs  MONITOR:   TF tolerance, Labs  REASON FOR ASSESSMENT:   Consult, Ventilator Enteral/tube feeding initiation and management  ASSESSMENT:   Pt with PMH of T1N0 Glottic cancer s/p XRT in 2013 who continued to smoke until 8 months ago. Pt lost his voice 6 months ago and the last 2 months has had throat pain. 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   Pt discussed during ICU rounds and with RN.  Spoke with pt and wife at bedside. Per pt and wife pt has been losing weigh for the last few month (last weight record he was at his usual weight was 03/2020). Per wife pt lost down significantly over the last month. Usual weight 185 lb. He is down to 141 lb (23% weight loss <5  months).   Pt had progressive dysphagia unable to swallow solid foods. Pt had start to drink boost/ensure/protein drink from [facility].  Per wife pt would eat a soft egg and finely chopped bacon for Breakfast, lunch was tomato soup, dinner was most problematic. He tried different soft foods but was unable to take more than a few bites.   Medications reviewed and include: MVI with minerals  Labs reviewed:  CBG's: 145    NUTRITION - FOCUSED PHYSICAL EXAM:    Most Recent Value  Orbital Region Moderate depletion  Upper Arm Region Severe depletion  Thoracic and Lumbar Region Moderate depletion  Buccal Region Moderate depletion  Temple Region Moderate depletion  Clavicle Bone Region Severe depletion  Clavicle and Acromion Bone Region Severe depletion  Scapular Bone Region Unable to assess  Dorsal Hand Severe depletion  Patellar Region Severe depletion  Anterior Thigh Region Severe depletion  Posterior Calf Region Severe depletion  Edema (RD Assessment) None  Hair Reviewed  Eyes Reviewed  Mouth Reviewed  Skin Reviewed  Nails Reviewed       Diet Order:   Diet Order            Diet NPO time specified  Diet effective now                 EDUCATION NEEDS:   No education needs have been identified at this time  Skin:  Skin Assessment: Reviewed RN Assessment  Last BM:  unknown  Height:   Ht Readings from Last 1 Encounters:  09/13/20 5' 8.5" (1.74 m)    Weight:   Wt Readings from Last 1 Encounters:  09/13/20 64.4 kg    Ideal Body Weight:     BMI:  There is no height or weight on file to calculate BMI.  Estimated Nutritional Needs:   Kcal:  2000-2300  Protein:  100-130 grams  Fluid:  >2 L/day  Marcus Collier., RD, LDN, CNSC See AMiON for contact information

## 2020-09-15 NOTE — Progress Notes (Signed)
Patient ID: Marcus Collier, male   DOB: [birth year], 74 y.o.   MRN: [id number] Subjective: Doing well, in great spirits, no complaints except that he is hungry.  His breathing is much better.  Objective: Vital signs in last 24 hours: Temp:  [97.8 F (36.6 C)-99.3 F (37.4 C)] 99 F (37.2 C) (09/22 1200) Pulse Rate:  [61-114] 71 (09/22 1200) Resp:  [14-29] 15 (09/22 1200) BP: (131-188)/(63-95) 149/63 (09/22 1200) SpO2:  [91 %-99 %] 99 % (09/22 1200) FiO2 (%):  [28 %] 28 % (09/22 1105) Weight change:     Intake/Output from previous day: 09/21 0701 - 09/22 0700 In: 2050 [I.V.:1950; IV Piggyback:100] Out: 650 [Urine:550; Blood:100] Intake/Output this shift: No intake/output data recorded.  PHYSICAL EXAM: Neck looks excellent.  Incision intact.  Stoma is widely patent and clear.  Laryngectomy tube in place.  Drains are functioning and holding a seal.  Lab Results: Recent Labs    09/14/20 1845 09/14/20 1845 09/14/20 1924 09/15/20 1015  WBC 23.2*  --   --  17.1*  HGB 11.6*   < > 11.6* 10.4*  HCT 35.6*   < > 34.0* 31.7*  PLT 383  --   --  274   < > = values in this interval not displayed.   BMET Recent Labs    09/14/20 1845 09/14/20 1845 09/14/20 1924 09/15/20 1015  NA 130*   < > 134* 135  K 4.3   < > 4.3 4.6  CL 96*   < > 101 100  CO2 22  --   --  25  GLUCOSE 274*   < > 283* 162*  BUN 25*   < > 25* 15  CREATININE 0.75   < > 0.50* 0.65  CALCIUM 8.9  --   --  8.5*   < > = values in this interval not displayed.    Studies/Results: X-ray abdomen AP  Result Date: 09/15/2020 CLINICAL DATA:  Check feeding catheter placement EXAM: ABDOMEN - 1 VIEW COMPARISON:  None. FINDINGS: Weighted feeding catheter is noted within the stomach. It lies only a few cm beyond the gastroesophageal junction. This could be advanced several cm as clinically necessary. IMPRESSION: Weighted feeding catheter as described. Electronically Signed   By: Inez Catalina M.D.   On: 09/15/2020 02:05   DG  Chest Portable 1 View  Result Date: 09/14/2020 CLINICAL DATA:  Respiratory distress EXAM: PORTABLE CHEST 1 VIEW COMPARISON:  08/05/2020 FINDINGS: Single frontal view of the chest demonstrates an unremarkable cardiac silhouette. Background emphysema again noted without focal airspace disease, effusion, or pneumothorax. No acute bony abnormalities. IMPRESSION: 1. Emphysema.  No acute process. Electronically Signed   By: Randa Ngo M.D.   On: 09/14/2020 19:03    Medications: I have reviewed the patient's current medications.  Assessment/Plan: Stable postop day 1.  We will maintain n.p.o. for 2 weeks.  We will start tube feeds today.  We will put drains on bulb suction as long as they hold.  Will ambulate and will transfer to 6 N. if available bed.  LOS: 1 day   Izora Gala 09/15/2020, 1:08 PM

## 2020-09-15 NOTE — Evaluation (Signed)
Speech Language Pathology Evaluation: Post-Laryngectomy Patient Details Name: Marcus Collier MRN: [id number] DOB: [birth year] Today's Date: 09/15/2020 Time: 7616-0737 SLP Time Calculation (min) (ACUTE ONLY): 32 min  Problem List:  Patient Active Problem List   Diagnosis Date Noted  . S/P laryngectomy 09/14/2020  . Pre-op evaluation 09/13/2020  . Coronary artery disease involving native coronary artery of native heart without angina pectoris 09/13/2020  . 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 Lesion of vocal cord 11/26/2012  . Cancer of vocal cord ([place]) 11/26/2012   Past Medical History:  Past Medical History:  Diagnosis Date  . Acute myocardial infarction ([place])   . Aortic stenosis    mild AS 09/13/20 echo [facility])  . Cancer of vocal cord ([place]) 11/15/12   Left  . Family history of adverse reaction to anesthesia   . Headache    migraine  . Hoarseness of voice   . Hypertension   . 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 S/P radiation therapy 12/10/2012 - 01/20/2013   Vocal Cords/ 63 Gray/ 28 Fractions  . Unspecified malignant neoplasm of skin, site unspecified    Past Surgical History:  Past Surgical History:  Procedure Laterality Date  . Biopsy of Vocal cord  11/15/12   Right and Left  . Biospy mouth  11/15/12  . CARDIAC CATHETERIZATION     with stents   . CORONARY ANGIOPLASTY    . DIRECT LARYNGOSCOPY N/A 08/11/2020   Procedure: DIRECT LARYNGOSCOPY WITH BIOPSY;  Surgeon: Izora Gala, MD;  Location: [place];  Service: ENT;  Laterality: N/A;  Harvie Junior N/A 09/14/2020   Procedure: LARYNGECTOMY Total;  Surgeon: Izora Gala, MD;  Location: [place];  Service: ENT;  Laterality: N/A;  NEEDS RNFA   HPI:  Pt is a 74 yo male scheduled for total laryngectomy but presented early due to worsening SOB and noisy breathing. 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  Per ENT note 9/20, pt had a lesion involving the entire glottis circumferentially. Pt underwent awake trach followed by total laryngectomy on 9/21. PMH also includes: HTN, MI, h/o smoking   Assessment / Plan / Recommendation Clinical Impression  Pt was seen post-operatively for initiation of stoma care, education, and elecrolarynx (EL) use with RN also present for training. Pt already had a larytube in place (9/36), secured with holders although still mildly protruding from stoma. Attempted to place HME but pt very quickly said that he felt like he could not tolerate it. VS were stable and no significantly audible secretions were noted. He did not want to try replacement right now, but was agreeable to trying later when larytube is also removed for cleaning. SLP provided education to pt/RN regarding frequency and method of maintaining HMEs and tubes. Pictures of anatomy pre- and post-surgery were left at bedside, as pt did not feel ready to look at these yet.   SLP also provided pt with tablet from LPK and EL to facilitate communication. Pt began practicing with the EL at the word level with Mod cues and education provided, primarily for adjusting placement and learning when to start/stop vibration. Pt was encouraged to use his EL as much as possible throughout the day. SLP will continue to follow for additional training.     SLP Assessment  SLP Recommendation/Assessment: Patient needs continued Speech Lanaguage Pathology Services SLP Visit Diagnosis: Aphonia (R49.1)    Follow Up Recommendations  Home health SLP;Outpatient SLP (likely starting with HH, transitioning to OP)    Frequency and Duration min 3x week  2 weeks      SLP Evaluation Cognition  Overall Cognitive Status: Within Functional Limits for tasks assessed       Comprehension  Auditory Comprehension Overall Auditory Comprehension: Appears within functional limits for tasks assessed    Expression Expression Primary Mode of  Expression: Verbal Verbal Expression Overall Verbal Expression: Appears within functional limits for tasks assessed Non-Verbal Means of Communication: Gestures;Other (comment) (electrolarynx)   Oral / Surveyor, quantity Overall Motor Speech: Impaired Respiration: Within functional limits Phonation: Aphonic Articulation: Within functional limitis Intelligibility: Intelligibility reduced Word: 50-74% accurate   GO                    Osie Bond., M.A. [place] [facility] Pager [phone] Office [phone]  09/15/2020, 9:37 AM

## 2020-09-15 NOTE — Procedures (Signed)
Cortrak  Person Inserting Tube:  Rosezetta Schlatter, RD Tube Type:  Cortrak - 43 inches Tube Location:  Left nare Initial Placement:  Stomach Secured by: Bridle Technique Used to Measure Tube Placement:  Documented cm marking at nare/ corner of mouth Cortrak Secured At:  66 cm Procedure Comments:  Cortrak Tube Team Note:  Consult received to place a Cortrak feeding tube.   No x-ray is required. RN may begin using tube.    If the tube becomes dislodged please keep the tube and contact the Cortrak team at [email or web address] ([email or web address]) for replacement.  If after hours and replacement cannot be delayed, place a NG tube and confirm placement with an abdominal x-ray.     Jarome Matin, MS, RD, LDN, CNSC Inpatient Clinical Dietitian RD pager # available in [place]  After hours/weekend pager # available in [facility]

## 2020-09-15 NOTE — Progress Notes (Signed)
Pt educated extensively about Lary tube, HME, etc. Pt demonstrated putting HME on and off. All emergency equipment and signage at bedside.RN Staff and SLP to continue to educate pt on new Laryngectomy care.

## 2020-09-15 NOTE — Progress Notes (Signed)
   09/15/20 1325  Clinical Encounter Type  Visited With Patient and family together  Visit Type Initial  Referral From Nurse  Consult/Referral To Chaplain   Chaplain responded to consult request for AD. Chaplain talked through [facility] with Pt and Pt's wife, Diane. Pt's wife said they would take it home and discuss later. Chaplain remains available as needed.  This note was prepared by Chaplain Resident, Dante Gang, MDiv. For questions, please contact by phone at [phone].

## 2020-09-15 NOTE — Progress Notes (Signed)
RN reached out to SLP for education regarding laryngectomy care.

## 2020-09-15 NOTE — Op Note (Signed)
OPERATIVE REPORT  DATE OF SURGERY: 09/15/2020  PATIENT:  Marcus Collier,  74 y.o. male  PRE-OPERATIVE DIAGNOSIS:  laryngeal cancer, airway obstruction  POST-OPERATIVE DIAGNOSIS:  laryngeal cancer, airway obstruction  PROCEDURE:  Procedure(s): Awake local tracheostomy, LARYNGECTOMY Total  SURGEON:  Beckie Salts, MD  ASSISTANTS: None  ANESTHESIA:   Local, followed by General   EBL: 100 ml  DRAINS: 15 [place] round JP x2  LOCAL MEDICATIONS USED: 1% Xylocaine with epinephrine  SPECIMEN: Total laryngectomy  COUNTS:  Correct  PROCEDURE DETAILS: The patient was taken to the operating room and placed on the operating table in the supine position. Following induction of mild intravenous sedation anesthesia, the neck was prepped and draped in the standard fashion.  A circular incision was outlined with a marking pen just above the sternal notch for the proposed tracheal stoma.  A transverse incision was outlined about four 5 cm above that at the level of the midportion of the larynx.  Local anesthetic was infiltrated into the tracheostomy site.  Electrocautery was used to incise the skin and to remove the circle of skin.  Dissection down through the subcutaneous fat fascia and the strap muscles revealed the upper trachea.  A tracheostomy was created between the first and second ring anteriorly.  A size 7 reinforced endotracheal tube was then placed without difficulty and secured to the skin with a silk suture.  The patient was then induced under general endotracheal anesthesia for the remainder for the procedure.  A transverse incision was then created.  Platysma was divided and subplatysmal flaps were elevated superiorly to just above the hyoid and inferiorly connecting down to the tracheal stoma.  May Horne retractor was used.  Electrocautery and bipolar cautery as well as 4-0 silk ties were used for hemostasis as needed.  The larynx was skeletonized.  Starting superiorly at the suprahyoid  musculature.  Progressing then laterally along the constrictor muscles dissecting off of the thyroid lamina on the left side.  The left thyroid lobe was reflected laterally and preserved with its blood supply and inferiorly.  The right thyroid lobe was taken with the specimen and all of its blood supply was transected and tied off with silk ligatures.  The right side of the hyoid was then skeletonized followed by the right thyroid cartilage.  The lower strap muscles were divided exposing the tracheostomy in the upper trachea.  The tracheal division was then accomplished beveling upwards.  The party wall was divided using electrocautery following up towards the post cricoid area.  The pharynx was entered on the left side at the piriform sinus.  Using direct visualization the epiglottis was identified and reflected inferiorly exposing the tumor in the endolarynx and a normal cuff of mucosal tissue.  The remaining mucosal cuts were accomplished keeping good margins around the tumor.  The post cricoid mucosa was transected last.  The specimen was delivered and opened posteriorly on the back table for inspection.  There is a large ulcerative mass involving what appeared to be almost circumferentially along the glottis and the supraglottic region.  Mucosal margins all looked completely clear for at least 2 cm.  There did not appear to be any subglottic extension.  The pharynx was reapproximated using 4-0 chromic, three limbs in a T fashion.  The last approximation was done at the trifurcation.  Along the closure there was adequate mucosal and the pharynx was not tight by digital evaluation.  Prior to closing the pharynx a nasogastric feeding tube was placed  through the left side of the nose and entered easily down into the esophagus and advanced to the stomach.  This was secured in place using benzoin and tape.  After the pharynx was closed it was assessed for leaks by using a saline filled [place] into the oral  cavity.  There was good flow of saline without any evidence of leak.  A secondary layer closure was accomplished using left thyroid lobe and constrictor muscles.  The wound was irrigated with saline.  Hemostasis was completed.  Drains were placed one on each side exited through separate stab incision secured in place with silk suture.  The platysma was reapproximated with interrupted 3-0 chromic.  Skin staples were used on the skin.  The stoma was matured using interrupted 4-0 Vicryl all around.  A laryngectomy tube was placed at the end.  The drains were charged.  Patient was awakened transferred to recovery in stable condition.    PATIENT DISPOSITION:  To PACU, stable

## 2020-09-15 NOTE — Progress Notes (Signed)
  Speech Language Pathology Treatment:  (post-laryngectomy)  Patient Details Name: Marcus Collier MRN: [id number] DOB: October 04, 1946 Today's Date: 09/15/2020 Time: 1537-9432 SLP Time Calculation (min) (ACUTE ONLY): 27 min  Assessment / Plan / Recommendation Clinical Impression  Pt was seen again this afternoon to address care of his larytube. Larytube was removed with mild amount of bloody secretions on the distal end. SLP cleaned this and replaced it with small amount of lubricant. Mild coughing noted from pt. SLP placed the HME on a his clean larytube with improved tolerance noted. Pt practiced donning and doffing his HME x1 each, using additional time and Min-Mod verbal cues. He felt more comfortable leaving HME in place now. SLP educated him on the importance of 24/7 use as he can tolerate to facilitate secretion management. I missed his wife this afternoon, who had stepped out during quiet hours, but will f/u to provide further education and training to both Marcus Collier and his wife.    HPI HPI: Pt is a 74 yo male scheduled for total laryngectomy but presented early due to worsening SOB and noisy breathing. 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 Per ENT note 9/20, pt had a lesion involving the entire glottis circumferentially. Pt underwent awake trach followed by total laryngectomy on 9/21. PMH also includes: HTN, MI, h/o smoking      SLP Plan  Continue with current plan of care       Recommendations                   Follow up Recommendations: Home health SLP;Outpatient SLP (likely HH transitioning to OP as able) SLP Visit Diagnosis: Aphonia (R49.1) Plan: Continue with current plan of care       GO                Osie Bond., M.A. [place] [facility] Pager [phone] Office [phone]  09/15/2020, 4:59 PM

## 2020-09-15 NOTE — Transfer of Care (Signed)
Immediate Anesthesia Transfer of Care Note  Patient: Marcus Collier  Procedure(s) Performed: LARYNGECTOMY Total (N/A )  Patient Location: PACU  Anesthesia Type:General  Level of Consciousness: awake  Airway & Oxygen Therapy: Patient Spontanous Breathing and Patient connected to tracheostomy mask oxygen  Post-op Assessment: Report given to RN and Post -op Vital signs reviewed and stable  Post vital signs: Reviewed and stable  Last Vitals:  Vitals Value Taken Time  BP    Temp    Pulse    Resp    SpO2      Last Pain:  Vitals:   09/14/20 1847  TempSrc: Oral  PainSc:          Complications: No complications documented.

## 2020-09-15 NOTE — Progress Notes (Signed)
Notified MD neither JP will hold charge.  MD wants JPs hooked to wall suction.

## 2020-09-16 DIAGNOSIS — E43 Unspecified severe protein-calorie malnutrition: Secondary | ICD-10-CM | POA: Insufficient documentation

## 2020-09-16 LAB — GLUCOSE, CAPILLARY
Glucose-Capillary: 164 mg/dL — ABNORMAL HIGH (ref 70–99)
Glucose-Capillary: 174 mg/dL — ABNORMAL HIGH (ref 70–99)
Glucose-Capillary: 156 mg/dL — ABNORMAL HIGH (ref 70–99)
Glucose-Capillary: 178 mg/dL — ABNORMAL HIGH (ref 70–99)
Glucose-Capillary: 146 mg/dL — ABNORMAL HIGH (ref 70–99)

## 2020-09-16 LAB — MAGNESIUM
Magnesium: 2.1 mg/dL (ref 1.7–2.4)
Magnesium: 1.9 mg/dL (ref 1.7–2.4)

## 2020-09-16 LAB — PHOSPHORUS
Phosphorus: 2.9 mg/dL (ref 2.5–4.6)
Phosphorus: 2 mg/dL — ABNORMAL LOW (ref 2.5–4.6)

## 2020-09-16 NOTE — Progress Notes (Signed)
Patient placed on trach collar due to desaturations into the mid 80's. Explained to patient why he should wear his trach collar. Patient placed on 40% and saturations improved to 98%.

## 2020-09-16 NOTE — Progress Notes (Signed)
Nutrition Follow-up  DOCUMENTATION CODES:   Severe malnutrition in context of chronic illness  INTERVENTION:   Continue Osmolite 1.5 @ 55 ml/hr via cortrak tube  Prosource TF 45 ml TID  Provides 2100 kcal, 115 gm protein, 1003 ml free water daily  MVI with minerals   100 ml free water every 6 hours Total free water per tube: 1403 ml    Monitor magnesium and phosphorus every 12 hours x 4 occurences or until normalized, MD to replete as needed, as pt is at risk for refeeding syndrome given severe malnutrition.  NUTRITION DIAGNOSIS:   Severe Malnutrition related to chronic illness (cancer) as evidenced by severe fat depletion, moderate fat depletion, severe muscle depletion, moderate muscle depletion, energy intake < or equal to 75% for > or equal to 1 month, percent weight loss.  Ongoing  GOAL:   Patient will meet greater than or equal to 90% of their needs  Progressing   MONITOR:   TF tolerance, Labs  REASON FOR ASSESSMENT:   Consult, Ventilator Enteral/tube feeding initiation and management  ASSESSMENT:   Pt with PMH of T1N0 Glottic cancer s/p XRT in 2013 who continued to smoke until 8 months ago. Pt lost his voice 6 months ago and the last 2 months has had throat pain. 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  9/22- cortrak tube placed (tip: gastric), TF initiated  Reviewed I/O's: +1.8 L x 24 hours and +3.2 L since admission  UOP: 150 ml x 24 hours  Drain output: 45 ml x 24 hours   Case discussed with RN, who reports pt tolerating TF well. He remains on trach collar.   Spoke with pt at bedside, who responded mostly with hand gestures and head nods. Pt reports tolerating TF well and denies any nausea, vomiting, and abdominal pain. Pt  expresses that he would like to eat, but understanding of NPO status and need for cortrak tube. TF currently infusion via cortrak tube at goal rate (55 ml/hr). Electrolytes WDL.   Labs reviewed: CBGS: 164-181. K and Mg WDL.   Diet Order:   Diet Order            Diet NPO time specified  Diet effective now                 EDUCATION NEEDS:   No education needs have been identified at this time  Skin:  Skin Assessment: Reviewed RN Assessment  Last BM:  Unknown  Height:   Ht Readings from Last 1 Encounters:  09/13/20 5' 8.5" (1.74 m)    Weight:   Wt Readings from Last 1 Encounters:  09/13/20 64.4 kg   BMI:  There is no height or weight on file to calculate BMI.  Estimated Nutritional Needs:   Kcal:  2000-2300  Protein:  100-130 grams  Fluid:  >2 L/day    Loistine Chance, RD, LDN, CDCES Registered Dietitian II Certified Diabetes Care and Education Specialist Please refer to [facility] for RD and/or RD on-call/weekend/after hours pager

## 2020-09-16 NOTE — Progress Notes (Signed)
Patient ID: Marcus Collier, male   DOB: May 22, 1946, 74 y.o.   MRN: [id number] Subjective: Complains of feeling as though he has to urinate but is unable to.  Objective: Vital signs in last 24 hours: Temp:  [97.6 F (36.4 C)-99 F (37.2 C)] 98.4 F (36.9 C) (09/23 0446) Pulse Rate:  [54-80] 79 (09/23 0820) Resp:  [14-25] 18 (09/23 0820) BP: (106-156)/(49-72) 123/53 (09/23 0446) SpO2:  [90 %-100 %] 97 % (09/23 0820) FiO2 (%):  [21 %-40 %] 40 % (09/23 0820) Weight change:     Intake/Output from previous day: 09/22 0701 - 09/23 0700 In: 1971.7 [I.V.:932; NG/GT:839.7; IV Piggyback:200] Out: 195 [Urine:150; Drains:45] Intake/Output this shift: No intake/output data recorded.  PHYSICAL EXAM: He is awake and alert.  Stoma is clear and healthy with laryngectomy tube in place and HME filter in place.  Minimal crusting.  Incision and skin flaps look excellent.  Drains are functioning with bulb suction, small amounts of serous material.  Lab Results: Recent Labs    09/14/20 1845 09/14/20 1845 09/14/20 1924 09/15/20 1015  WBC 23.2*  --   --  17.1*  HGB 11.6*   < > 11.6* 10.4*  HCT 35.6*   < > 34.0* 31.7*  PLT 383  --   --  274   < > = values in this interval not displayed.   BMET Recent Labs    09/14/20 1845 09/14/20 1845 09/14/20 1924 09/15/20 1015  NA 130*   < > 134* 135  K 4.3   < > 4.3 4.6  CL 96*   < > 101 100  CO2 22  --   --  25  GLUCOSE 274*   < > 283* 162*  BUN 25*   < > 25* 15  CREATININE 0.75   < > 0.50* 0.65  CALCIUM 8.9  --   --  8.5*   < > = values in this interval not displayed.    Studies/Results: X-ray abdomen AP  Result Date: 09/15/2020 CLINICAL DATA:  Check feeding catheter placement EXAM: ABDOMEN - 1 VIEW COMPARISON:  None. FINDINGS: Weighted feeding catheter is noted within the stomach. It lies only a few cm beyond the gastroesophageal junction. This could be advanced several cm as clinically necessary. IMPRESSION: Weighted feeding catheter as  described. Electronically Signed   By: Inez Catalina M.D.   On: 09/15/2020 02:05   DG Chest Portable 1 View  Result Date: 09/14/2020 CLINICAL DATA:  Respiratory distress EXAM: PORTABLE CHEST 1 VIEW COMPARISON:  08/05/2020 FINDINGS: Single frontal view of the chest demonstrates an unremarkable cardiac silhouette. Background emphysema again noted without focal airspace disease, effusion, or pneumothorax. No acute bony abnormalities. IMPRESSION: 1. Emphysema.  No acute process. Electronically Signed   By: Randa Ngo M.D.   On: 09/14/2020 19:03    Medications: I have reviewed the patient's current medications.  Assessment/Plan: Postop day 2, progressing nicely.  Advance tube feeds as tolerated.  In and out catheter today.  Continue to monitor.  LOS: 2 days   Izora Gala 09/16/2020, 8:34 AM

## 2020-09-16 NOTE — Progress Notes (Signed)
Refuses trach collar/humidified air.

## 2020-09-16 NOTE — Progress Notes (Signed)
  Speech Language Pathology Treatment:  (post-laryngectomy)  Patient Details Name: Marcus Collier MRN: [id number] DOB: 1946-03-24 Today's Date: 09/16/2020 Time: 0940-1005 SLP Time Calculation (min) (ACUTE ONLY): 25 min  Assessment / Plan / Recommendation Clinical Impression  Pt was seen for f/u for ongoing education about stoma care. RN was present for the beginning of the session and educated about care as well, including that external humidification via trach collar should NOT be used simultaneously with HME. TC was weaned off for session while RN was present with sats maintaining 90-93%. Pt doffed his HME with Mod I. SLP removed his larytube with a mild amount of darker-tinged, bloody secretions on the distal end. SLP cleaned and replaced his tube but then he donned his HME himself, placing it swiftly and with more ease than during initial attempt on previous date. Will continue to follow for ongoing training with additional emphasis on electrolarynx use.    HPI HPI: Pt is a 74 yo male scheduled for total laryngectomy but presented early due to worsening SOB and noisy breathing. 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 Per ENT note 9/20, pt had a lesion involving the entire glottis circumferentially. Pt underwent awake trach followed by total laryngectomy on 9/21. PMH also includes: HTN, MI, h/o smoking      SLP Plan  Continue with current plan of care       Recommendations                   Follow up Recommendations: Home health SLP;Outpatient SLP (likely HH first, transitioning to OP) SLP Visit Diagnosis: Aphonia (R49.1) Plan: Continue with current plan of care       GO                Osie Bond., M.A. [place] [facility] Pager [phone] Office [phone]  09/16/2020, 10:26 AM

## 2020-09-16 NOTE — Progress Notes (Signed)
Pt arrived to 6n2 from 4N at this time. Pt comfortable, no verbal complaints.

## 2020-09-17 LAB — GLUCOSE, CAPILLARY
Glucose-Capillary: 133 mg/dL — ABNORMAL HIGH (ref 70–99)
Glucose-Capillary: 165 mg/dL — ABNORMAL HIGH (ref 70–99)
Glucose-Capillary: 167 mg/dL — ABNORMAL HIGH (ref 70–99)
Glucose-Capillary: 169 mg/dL — ABNORMAL HIGH (ref 70–99)
Glucose-Capillary: 178 mg/dL — ABNORMAL HIGH (ref 70–99)
Glucose-Capillary: 184 mg/dL — ABNORMAL HIGH (ref 70–99)

## 2020-09-17 MED ORDER — FREE WATER
100.0000 mL | Freq: Three times a day (TID) | Status: DC
  Administered 2020-09-18 – 2020-09-20 (×9): 100 mL

## 2020-09-17 MED ORDER — OSMOLITE 1.5 CAL PO LIQD
330.0000 mL | Freq: Three times a day (TID) | ORAL | Status: DC
  Administered 2020-09-18 – 2020-09-20 (×9): 330 mL
  Filled 2020-09-17 (×7): qty 474

## 2020-09-17 NOTE — Progress Notes (Addendum)
Nutrition Follow-up  DOCUMENTATION CODES:   Severe malnutrition in context of chronic illness  INTERVENTION:   Continue Osmolite 1.5 @ 55 ml/hr via cortrak tube  Prosource TF41m TID  Provides2100kcal, 115gm protein, 1009mfree water daily  MVI with minerals   100 ml free water every 6 hours Total free water per tube: 1403 ml   Monitor magnesiumandphosphorusevery 12 hours x 4 occurences or until normalized, MD to replete as needed, as pt is at risk for refeeding syndrome givensevere malnutrition.  -Recommend permanent feeding access (ex PEG) if plans to discharge home on TF  -Transition to bolus feedings on 09/18/20 per discussion with Dr. RoConstance Holster 300 ml Osmolite 1.5 ml/hr via cortrak tube 4 times daily  45 ml Prosource TF TID  100 ml free water flush every 6 hours  Tube feeding regimen provides 2100 kcal (100% of needs), 115 grams of protein, and 1003 ml of H2O. Total free water: 1403  NUTRITION DIAGNOSIS:   Severe Malnutrition related to chronic illness (cancer) as evidenced by severe fat depletion, moderate fat depletion, severe muscle depletion, moderate muscle depletion, energy intake < or equal to 75% for > or equal to 1 month, percent weight loss.  Ongoing  GOAL:   Patient will meet greater than or equal to 90% of their needs  Met with TF  MONITOR:   TF tolerance, Labs  REASON FOR ASSESSMENT:   Consult, Ventilator Enteral/tube feeding initiation and management  ASSESSMENT:   Pt with PMH of T1N0 Glottic cancer s/p XRT in 2013 who continued to smoke until 8 months ago. Pt lost his voice 6 months ago and the last 2 months has had throat pain. 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  9/22- cortrak tube placed (tip:  gastric), TF initiated  Reviewed I/O's: -810 ml x 24 hours and +2.4 L since admission  UOP: 800 ml x 24 hours  Drain output: 10 ml x 24 hours  TF continues to infuse via cortrak tube at goal rate of 55 ml/hr. He continues to tolerate TF well.  Case discussed with Dr. RoConstance Holsterwho would like to transition to bolus feedings via cortrak tube. Spoke with SLP, who reports pt will likely be NPO for at least two weeks post-op and will assess readiness for PO's after gastrograffin study is completed. Cortrak tube in place is a 10 french feeding tube, which is small in diameter and would take large amount of time to pass feeding bolus through tube (this could also increase probability of clogging tube, resulting in delay in feedings and possible replacement). Given pt's malnutrition and plan for home tube feedings, permanent feeding access (ex PEG) would be a safer option for pt. RD will continue with continuous feedings for now. RD also communicated concerns to Dr. RoConstance Holsternd Dr. BaRedmond Basemanwho is covering this weekend) via secure chat. Per Dr. RoConstance Holsterhe would like to try bolus feedings to see if he tolerates prior to placing PEG. RD will transition to bolus feedings starting tomorrow.   Labs reviewed: CBGS: 165-184 (inpatient orders for glycemic control are none).   Diet Order:   Diet Order            Diet NPO time specified  Diet effective now  EDUCATION NEEDS:   No education needs have been identified at this time  Skin:  Skin Assessment: Skin Integrity Issues: Skin Integrity Issues:: Incisions Incisions: closed neck  Last BM:  Unknown  Height:   Ht Readings from Last 1 Encounters:  09/13/20 5' 8.5" (1.74 m)    Weight:   Wt Readings from Last 1 Encounters:  09/13/20 64.4 kg   BMI:  There is no height or weight on file to calculate BMI.  Estimated Nutritional Needs:   Kcal:  2000-2300  Protein:  100-130 grams  Fluid:  >2 L/day    Loistine Chance, RD, LDN,  CDCES Registered Dietitian II Certified Diabetes Care and Education Specialist Please refer to [facility] for RD and/or RD on-call/weekend/after hours pager

## 2020-09-17 NOTE — Progress Notes (Signed)
  Speech Language Pathology Treatment:  (post-laryngectomy)  Patient Details Name: Marcus Collier MRN: [id number] DOB: Dec 01, 1946 Today's Date: 09/17/2020 Time: 5797-2820 SLP Time Calculation (min) (ACUTE ONLY): 44 min  Assessment / Plan / Recommendation Clinical Impression  Pt was seen for ongoing post-laryngectomy care with wife, Marcus Collier, present for session today. Bill performed his own stoma care today with Min cues/assist needed from SLP. Training was also provided for some of the tasks that he had not performed on his own yet, such as exchanging his larytube. Diane was also educated throughout the process and asking appropriate questions. SLP showed them the Night HMEs and shower guard that were also in his LPK, so they have now been introduced to all items. Pt had additional questions about eating/drinking and longer-term stoma care. Education was provided with all questions answered at this time. Pt was also encouraged to use his EL throughout the session with Min cues needed for increased intelligibility at the phrase level. Pt is progressing very well in terms of communication as well as caring for his stoma.    HPI HPI: Pt is a 74 yo male scheduled for total laryngectomy but presented early due to worsening SOB and noisy breathing. 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 Per ENT note 9/20, pt had a lesion involving the entire glottis circumferentially. Pt underwent awake trach followed by total laryngectomy on 9/21. PMH also includes: HTN, MI, h/o smoking      SLP Plan  Continue with current plan of care       Recommendations                   Follow up Recommendations: Home health SLP;Outpatient SLP (likely HH initially, transitioning to OP) SLP Visit Diagnosis: Aphonia (R49.1) Plan: Continue with current plan of care       GO                Osie Bond., M.A. [place] [facility] Pager [phone] Office [phone]  09/17/2020, 2:07 PM

## 2020-09-17 NOTE — Progress Notes (Signed)
Patient ID: Marcus Collier, male   DOB: [birth year], 74 y.o.   MRN: [id number] Subjective: No complaints today.  He is tolerating tube feeds well.  He is voiding much more normally now.  Objective: Vital signs in last 24 hours: Temp:  [98.2 F (36.8 C)-99.9 F (37.7 C)] 98.4 F (36.9 C) (09/24 0420) Pulse Rate:  [68-91] 78 (09/24 0803) Resp:  [18-20] 18 (09/24 0803) BP: (136-156)/(52-70) 156/63 (09/24 0420) SpO2:  [92 %-95 %] 93 % (09/24 0803) FiO2 (%):  [21 %-40 %] 21 % (09/24 0137) Weight change:     Intake/Output from previous day: 09/23 0701 - 09/24 0700 In: 0  Out: 810 [Urine:800; Drains:10] Intake/Output this shift: Total I/O In: 0  Out: 300 [Urine:300]  PHYSICAL EXAM: He is awake and alert.  Breathing is clear.  Stoma looks excellent with laryngectomy tube in place.  Flaps are nice and flat.  No signs of swelling or infection.  Drains are holding a seal.  Small amount of serous drainage.  Lab Results: Recent Labs    09/14/20 1845 09/14/20 1845 09/14/20 1924 09/15/20 1015  WBC 23.2*  --   --  17.1*  HGB 11.6*   < > 11.6* 10.4*  HCT 35.6*   < > 34.0* 31.7*  PLT 383  --   --  274   < > = values in this interval not displayed.   BMET Recent Labs    09/14/20 1845 09/14/20 1845 09/14/20 1924 09/15/20 1015  NA 130*   < > 134* 135  K 4.3   < > 4.3 4.6  CL 96*   < > 101 100  CO2 22  --   --  25  GLUCOSE 274*   < > 283* 162*  BUN 25*   < > 25* 15  CREATININE 0.75   < > 0.50* 0.65  CALCIUM 8.9  --   --  8.5*   < > = values in this interval not displayed.    Studies/Results: No results found.  Medications: I have reviewed the patient's current medications.  Assessment/Plan: Postop day 3 doing very nicely.  Possible 1 or 2 drain removal tomorrow.  We will check labs again tomorrow.  We will work on transitioning to bolus feeds and will work on education for him and his wife so that he can go home with a nasogastric feeding tube.  LOS: 3 days   Izora Gala 09/17/2020, 10:22 AM

## 2020-09-17 NOTE — Care Management Important Message (Signed)
Important Message  Patient Details  Name: Marcus Collier MRN: [id number] Date of Birth: 06-Nov-1946   Medicare Important Message Given:  Yes     [name] 09/17/2020, 2:30 PM

## 2020-09-18 LAB — GLUCOSE, CAPILLARY
Glucose-Capillary: 113 mg/dL — ABNORMAL HIGH (ref 70–99)
Glucose-Capillary: 121 mg/dL — ABNORMAL HIGH (ref 70–99)
Glucose-Capillary: 122 mg/dL — ABNORMAL HIGH (ref 70–99)
Glucose-Capillary: 151 mg/dL — ABNORMAL HIGH (ref 70–99)
Glucose-Capillary: 196 mg/dL — ABNORMAL HIGH (ref 70–99)
Glucose-Capillary: 233 mg/dL — ABNORMAL HIGH (ref 70–99)
Glucose-Capillary: 320 mg/dL — ABNORMAL HIGH (ref 70–99)

## 2020-09-18 LAB — BASIC METABOLIC PANEL
Anion gap: 6 (ref 5–15)
BUN: 14 mg/dL (ref 8–23)
CO2: 23 mmol/L (ref 22–32)
Calcium: 8.2 mg/dL — ABNORMAL LOW (ref 8.9–10.3)
Chloride: 106 mmol/L (ref 98–111)
Creatinine, Ser: 0.64 mg/dL (ref 0.61–1.24)
GFR calc Af Amer: >60 mL/min (ref >60)
GFR calc non Af Amer: >60 mL/min (ref >60)
Glucose, Bld: 116 mg/dL — ABNORMAL HIGH (ref 70–99)
Potassium: 4.5 mmol/L (ref 3.5–5.1)
Sodium: 135 mmol/L (ref 135–145)

## 2020-09-18 LAB — CBC
HCT: 31.4 % — ABNORMAL LOW (ref 39.0–52.0)
Hemoglobin: 10.3 g/dL — ABNORMAL LOW (ref 13.0–17.0)
MCH: 30.5 pg (ref 26.0–34.0)
MCHC: 32.8 g/dL (ref 30.0–36.0)
MCV: 92.9 fL (ref 80.0–100.0)
Platelets: 276 10*3/uL (ref 150–400)
RBC: 3.38 MIL/uL — ABNORMAL LOW (ref 4.22–5.81)
RDW: 13.1 % (ref 11.5–15.5)
WBC: 14.1 10*3/uL — ABNORMAL HIGH (ref 4.0–10.5)
nRBC: 0 % (ref 0.0–0.2)

## 2020-09-18 NOTE — Progress Notes (Signed)
Subjective: Doing well.  No complaints.  Objective: Vital signs in last 24 hours: Temp:  [98.2 F (36.8 C)-100.2 F (37.9 C)] 98.6 F (37 C) (09/25 0354) Pulse Rate:  [68-87] 68 (09/25 0700) Resp:  [16-19] 18 (09/25 0700) BP: (133-157)/(50-68) 133/68 (09/25 0354) SpO2:  [92 %-95 %] 95 % (09/25 0700) Wt Readings from Last 1 Encounters:  09/13/20 64.4 kg    Intake/Output from previous day: 09/24 0701 - 09/25 0700 In: 500 [I.V.:500] Out: 1560 [Urine:1500; Emesis/NG output:30; Drains:30] Intake/Output this shift: No intake/output data recorded.  General appearance: alert, cooperative and no distress Neck: neck incision clean and intact, stoma intact with Lary tube in place, drains functioning  Recent Labs    09/15/20 1015 09/18/20 0136  WBC 17.1* 14.1*  HGB 10.4* 10.3*  HCT 31.7* 31.4*  PLT 274 276    Recent Labs    09/15/20 1015 09/18/20 0136  NA 135 135  K 4.6 4.5  CL 100 106  CO2 25 23  GLUCOSE 162* 116*  BUN 15 14  CREATININE 0.65 0.64  CALCIUM 8.5* 8.2*    Medications: I have reviewed the patient's current medications.  Assessment/Plan: Laryngeal cancer s/p total laryngectomy  Doing well.  Drains without significant output.  Will remove first drain tomorrow.  Nursing trying to do bolus feeds through the Coretrack.   LOS: 4 days   Melida Quitter 09/18/2020, 10:06 AM

## 2020-09-19 LAB — GLUCOSE, CAPILLARY
Glucose-Capillary: 114 mg/dL — ABNORMAL HIGH (ref 70–99)
Glucose-Capillary: 116 mg/dL — ABNORMAL HIGH (ref 70–99)
Glucose-Capillary: 178 mg/dL — ABNORMAL HIGH (ref 70–99)
Glucose-Capillary: 203 mg/dL — ABNORMAL HIGH (ref 70–99)
Glucose-Capillary: 282 mg/dL — ABNORMAL HIGH (ref 70–99)
Glucose-Capillary: 99 mg/dL (ref 70–99)

## 2020-09-19 NOTE — Progress Notes (Signed)
Subjective: Feeling good.  No complaints.  Objective: Vital signs in last 24 hours: Temp:  [98.6 F (37 C)-99.9 F (37.7 C)] 99.1 F (37.3 C) (09/26 0408) Pulse Rate:  [73-87] 81 (09/26 0408) Resp:  [16-18] 17 (09/26 0408) BP: (113-159)/(59-69) 159/69 (09/26 0408) SpO2:  [93 %-96 %] 94 % (09/26 0408) Wt Readings from Last 1 Encounters:  09/13/20 64.4 kg    Intake/Output from previous day: 09/25 0701 - 09/26 0700 In: 1550.9 [I.V.:790.9; NG/GT:760] Out: 900 [Urine:900] Intake/Output this shift: No intake/output data recorded.  General appearance: alert, cooperative and no distress Neck: neck incision intact, stoma intact, neither drain holding suction so both removed  Recent Labs    09/18/20 0136  WBC 14.1*  HGB 10.3*  HCT 31.4*  PLT 276    Recent Labs    09/18/20 0136  NA 135  K 4.5  CL 106  CO2 23  GLUCOSE 116*  BUN 14  CREATININE 0.64  CALCIUM 8.2*    Medications: I have reviewed the patient's current medications.  Assessment/Plan: Laryngeal cancer s/p total laryngectomy  Doing well.  Drains removed.  Plan to remain as inpatient with Coretrack in place for feeding until able to swallow by mouth.  Plan attempt late this week.   LOS: 5 days   Melida Quitter 09/19/2020, 8:39 AM

## 2020-09-20 LAB — HEMOGLOBIN A1C
Hgb A1c MFr Bld: 7.1 % — ABNORMAL HIGH (ref 4.8–5.6)
Mean Plasma Glucose: 157.07 mg/dL

## 2020-09-20 LAB — GLUCOSE, CAPILLARY
Glucose-Capillary: 99 mg/dL (ref 70–99)
Glucose-Capillary: 116 mg/dL — ABNORMAL HIGH (ref 70–99)
Glucose-Capillary: 292 mg/dL — ABNORMAL HIGH (ref 70–99)
Glucose-Capillary: 185 mg/dL — ABNORMAL HIGH (ref 70–99)
Glucose-Capillary: 65 mg/dL — ABNORMAL LOW (ref 70–99)
Glucose-Capillary: 138 mg/dL — ABNORMAL HIGH (ref 70–99)

## 2020-09-20 MED ORDER — INSULIN ASPART 100 UNIT/ML ~~LOC~~ SOLN
0.0000 [IU] | SUBCUTANEOUS | Status: DC
  Administered 2020-09-20 – 2020-09-21 (×3): 1 [IU] via SUBCUTANEOUS
  Administered 2020-09-21: 2 [IU] via SUBCUTANEOUS
  Administered 2020-09-21 – 2020-09-22 (×4): 1 [IU] via SUBCUTANEOUS
  Administered 2020-09-22 (×2): 2 [IU] via SUBCUTANEOUS
  Administered 2020-09-22: 1 [IU] via SUBCUTANEOUS
  Administered 2020-09-23: 2 [IU] via SUBCUTANEOUS
  Administered 2020-09-23 (×3): 1 [IU] via SUBCUTANEOUS
  Administered 2020-09-23 (×2): 2 [IU] via SUBCUTANEOUS
  Administered 2020-09-23: 1 [IU] via SUBCUTANEOUS
  Administered 2020-09-24: 2 [IU] via SUBCUTANEOUS

## 2020-09-20 MED ORDER — FREE WATER
170.0000 mL | Status: DC
  Administered 2020-09-20 – 2020-09-24 (×25): 170 mL

## 2020-09-20 MED ORDER — OSMOLITE 1.5 CAL PO LIQD
1000.0000 mL | ORAL | Status: DC
  Administered 2020-09-20 – 2020-09-23 (×5): 1000 mL
  Filled 2020-09-20 (×6): qty 1000

## 2020-09-20 MED ORDER — DEXTROSE 50 % IV SOLN
12.5000 g | INTRAVENOUS | Status: AC
  Administered 2020-09-20: 12.5 g via INTRAVENOUS

## 2020-09-20 MED ORDER — INSULIN ASPART 100 UNIT/ML ~~LOC~~ SOLN: 0.0000 [IU] | SUBCUTANEOUS | Status: DC

## 2020-09-20 MED ORDER — DEXTROSE 50 % IV SOLN
INTRAVENOUS | Status: AC
  Filled 2020-09-20: qty 50

## 2020-09-20 NOTE — Progress Notes (Signed)
  Speech Language Pathology Treatment:  (post-larygectomy care)  Patient Details Name: Marcus Collier MRN: [id number] DOB: [birth year] Today's Date: 09/20/2020 Time: 3428-7681 SLP Time Calculation (min) (ACUTE ONLY): 14 min  Assessment / Plan / Recommendation Clinical Impression  Pt was seen for ongoing education. He had already cleaned his lary tube and exchanged his HME around noon, so not needed at this time. SLP provided Min cues and encouragement for pt to use electrolarynx. He says that he has been using it sometimes, but not consistently yet. Even with intermittent use, I can understand him well at the word and short-phrase level. SLP also provided [employer] folder with additional education about post-laryngectomy care, also including pt identifiers that indicate his need to be oxygenated through the neck (bracelet, sticker, wallet ID card). All materials were reviewed and questions answered. Pt says he will look through the information in more detail on his own time. Will f/u for ongoing training.    HPI HPI: Pt is a 74 yo male scheduled for total laryngectomy but presented early due to worsening SOB and noisy breathing. 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 Per ENT note 9/20, pt had a lesion involving the entire glottis circumferentially. Pt underwent awake trach followed by total laryngectomy on 9/21. PMH also includes: HTN, MI, h/o smoking      SLP Plan  Continue with current plan of care       Recommendations                   Follow up Recommendations: Home health SLP;Outpatient SLP (likely HH initially, transitioning to OP) SLP Visit Diagnosis: Aphonia (R49.1) Plan: Continue with current plan of care       GO                Osie Bond., M.A. [place] [facility] Pager [phone] Office [phone]  09/20/2020, 2:48 PM

## 2020-09-20 NOTE — Progress Notes (Signed)
Patient Service Form and Prescription form for laryngectomy supplies faxed to [facility].

## 2020-09-20 NOTE — Progress Notes (Signed)
Hypoglycemic Event  CBG: 65  Treatment: Dextrose 12.5g given  Symptoms: None  Follow-up CBG: Time:1615 CBG Result:165  Possible Reasons for Event: getting tube feeding  Comments/MD notified:Dr. Otis Brace

## 2020-09-20 NOTE — Progress Notes (Addendum)
Subjective: Feeling good.  No complaints.  Objective: Vital signs in last 24 hours: Temp:  [98.3 F (36.8 C)-98.9 F (37.2 C)] 98.3 F (36.8 C) (09/27 0358) Pulse Rate:  [63-74] 63 (09/27 0830) Resp:  [16-18] 18 (09/27 1158) BP: (116-127)/(51-56) 116/56 (09/27 0358) SpO2:  [92 %-98 %] 95 % (09/27 0830) FiO2 (%):  [21 %] 21 % (09/26 1445) Wt Readings from Last 1 Encounters:  09/13/20 64.4 kg    Intake/Output from previous day: 09/26 0701 - 09/27 0700 In: 1144.4 [I.V.:1144.4] Out: 1175 [Urine:1175] Intake/Output this shift: Total I/O In: 100 [NG/GT:100] Out: -   General appearance: alert, cooperative and no distress Neck: neck incision and stoma intact and healing well, Lary tube in place with HME in place  Recent Labs    09/18/20 0136  WBC 14.1*  HGB 10.3*  HCT 31.4*  PLT 276    Recent Labs    09/18/20 0136  NA 135  K 4.5  CL 106  CO2 23  GLUCOSE 116*  BUN 14  CREATININE 0.64  CALCIUM 8.2*    Medications: I have reviewed the patient's current medications.  Assessment/Plan: Laryngeal cancer s/p total laryngectomy  Doing well.  Continue tube feeds until likely attempting swallow on Thursday.  Gave his wife an update by phone.   LOS: 6 days   Marcus Collier 09/20/2020, 12:19 PM

## 2020-09-20 NOTE — Progress Notes (Signed)
Nutrition Follow-up  DOCUMENTATION CODES:   Severe malnutrition in context of chronic illness  INTERVENTION:   -D/c bolus feedings -Initiate Osmolite 1.5 @ 55 ml/hr via cortrak tube  Prosource TF25ml TID  Provides2100kcal, 115gm protein, 1016ml free water daily  MVI with minerals   170 ml free water every 4 hours Total free water per tube: 2023 ml  -Consider bowel regimen; pt with no documented BM since admission  NUTRITION DIAGNOSIS:   Severe Malnutrition related to chronic illness (cancer) as evidenced by severe fat depletion, moderate fat depletion, severe muscle depletion, moderate muscle depletion, energy intake < or equal to 75% for > or equal to 1 month, percent weight loss.  Ongoing  GOAL:   Patient will meet greater than or equal to 90% of their needs  Progressing   MONITOR:   TF tolerance, Labs  REASON FOR ASSESSMENT:   Consult, Ventilator Enteral/tube feeding initiation and management  ASSESSMENT:   Pt with PMH of T1N0 Glottic cancer s/p XRT in 2013 who continued to smoke until 8 months ago. Pt lost his voice 6 months ago and the last 2 months has had throat pain. 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  9/22- cortrak tube placed (tip: gastric), TF initiated 9/26- drains removed  Reviewed I/O's: -30.6 ml x 24 hours and +1.9 L since admission  UOP: 1.2 L x 24 hours  Case discussed with RN, who reports that bolus feedings are very laborious and expressed concern of tube clogging due to bolus feedings and multiple medications. Per RN, plan to stay inpatient until pt passes swallow study (likely sometime later this week). RD will transition back to continuous feedings; RN in agreement.   Labs reviewed: CBGS: 99-203  (inpatient orders for glycemic control are none).   Diet Order:   Diet Order            Diet NPO time specified  Diet effective now                 EDUCATION NEEDS:   No education needs have been identified at this time  Skin:  Skin Assessment: Skin Integrity Issues: Skin Integrity Issues:: Incisions Incisions: closed neck  Last BM:  Unknown  Height:   Ht Readings from Last 1 Encounters:  09/13/20 5' 8.5" (1.74 m)    Weight:   Wt Readings from Last 1 Encounters:  09/13/20 64.4 kg   BMI:  There is no height or weight on file to calculate BMI.  Estimated Nutritional Needs:   Kcal:  2000-2300  Protein:  100-130 grams  Fluid:  >2 L/day    Loistine Chance, RD, LDN, CDCES Registered Dietitian II Certified Diabetes Care and Education Specialist Please refer to [facility] for RD and/or RD on-call/weekend/after hours pager

## 2020-09-21 LAB — GLUCOSE, CAPILLARY
Glucose-Capillary: 119 mg/dL — ABNORMAL HIGH (ref 70–99)
Glucose-Capillary: 137 mg/dL — ABNORMAL HIGH (ref 70–99)
Glucose-Capillary: 139 mg/dL — ABNORMAL HIGH (ref 70–99)
Glucose-Capillary: 148 mg/dL — ABNORMAL HIGH (ref 70–99)
Glucose-Capillary: 149 mg/dL — ABNORMAL HIGH (ref 70–99)
Glucose-Capillary: 153 mg/dL — ABNORMAL HIGH (ref 70–99)

## 2020-09-21 NOTE — Progress Notes (Signed)
Subjective: No complaints.  Objective: Vital signs in last 24 hours: Temp:  [97.9 F (36.6 C)-99.2 F (37.3 C)] 97.9 F (36.6 C) (09/28 1007) Pulse Rate:  [67-73] 67 (09/28 1148) Resp:  [16-20] 18 (09/28 1148) BP: (113-150)/(53-62) 138/59 (09/28 1007) SpO2:  [93 %-97 %] 94 % (09/28 1148) FiO2 (%):  [21 %] 21 % (09/27 1627) Weight:  [64.8 kg] 64.8 kg (09/28 0457) Wt Readings from Last 1 Encounters:  09/21/20 64.8 kg    Intake/Output from previous day: 09/27 0701 - 09/28 0700 In: 1445.1 [I.V.:814.9; NG/GT:630.3] Out: 1400 [Urine:1400] Intake/Output this shift: Total I/O In: -  Out: 660 [Urine:660]  General appearance: alert, cooperative and no distress Neck: incision and stoma intact, removed some crusting at stoma, Lary tube in place  No results for input(s): WBC, HGB, HCT, PLT in the last 72 hours.  No results for input(s): NA, K, CL, CO2, GLUCOSE, BUN, CREATININE, CALCIUM in the last 72 hours.  Medications: I have reviewed the patient's current medications.  Assessment/Plan: Laryngeal cancer s/p total laryngectomy  Continues to look good.  Plan for oral diet starting in two days.  Possible discharge Friday.   LOS: 7 days   Marcus Collier 09/21/2020, 12:21 PM

## 2020-09-22 LAB — GLUCOSE, CAPILLARY
Glucose-Capillary: 119 mg/dL — ABNORMAL HIGH (ref 70–99)
Glucose-Capillary: 125 mg/dL — ABNORMAL HIGH (ref 70–99)
Glucose-Capillary: 133 mg/dL — ABNORMAL HIGH (ref 70–99)
Glucose-Capillary: 135 mg/dL — ABNORMAL HIGH (ref 70–99)
Glucose-Capillary: 156 mg/dL — ABNORMAL HIGH (ref 70–99)
Glucose-Capillary: 158 mg/dL — ABNORMAL HIGH (ref 70–99)

## 2020-09-22 LAB — SURGICAL PATHOLOGY

## 2020-09-22 MED ORDER — WHITE PETROLATUM EX OINT
TOPICAL_OINTMENT | CUTANEOUS | Status: AC
  Administered 2020-09-22: 1
  Filled 2020-09-22: qty 28.35

## 2020-09-22 NOTE — Progress Notes (Signed)
Subjective: Feeling good, no complaints.  Objective: Vital signs in last 24 hours: Temp:  [98.1 F (36.7 C)-98.5 F (36.9 C)] 98.1 F (36.7 C) (09/29 0422) Pulse Rate:  [61-71] 69 (09/29 1113) Resp:  [15-18] 18 (09/29 1113) BP: (118-129)/(51-86) 129/86 (09/29 0422) SpO2:  [93 %-97 %] 93 % (09/29 1113) FiO2 (%):  [21 %] 21 % (09/29 1113) Weight:  [64.6 kg] 64.6 kg (09/29 0500) Wt Readings from Last 1 Encounters:  09/22/20 64.6 kg    Intake/Output from previous day: 09/28 0701 - 09/29 0700 In: 3245.4 [I.V.:998.3; NG/GT:2247.1] Out: 2485 [Urine:2485] Intake/Output this shift: Total I/O In: 0  Out: 575 [Urine:575]  General appearance: alert, cooperative and no distress Neck: incision and stoma clean and intact, Lary tube in place  No results for input(s): WBC, HGB, HCT, PLT in the last 72 hours.  No results for input(s): NA, K, CL, CO2, GLUCOSE, BUN, CREATININE, CALCIUM in the last 72 hours.  Medications: I have reviewed the patient's current medications.  Assessment/Plan: Laryngeal cancer s/p total laryngectomy  Doing well.  Plan oral swallow challenge tomorrow.  Possible discharge home Friday.   LOS: 8 days   Melida Quitter 09/22/2020, 12:58 PM

## 2020-09-22 NOTE — Progress Notes (Signed)
  Speech Language Pathology Treatment:  (post-laryngectomy care)  Patient Details Name: Marcus Collier MRN: [id number] DOB: 31-Jan-1946 Today's Date: 09/22/2020 Time: 3343-5686 SLP Time Calculation (min) (ACUTE ONLY): 28 min  Assessment / Plan / Recommendation Clinical Impression  Pt was seen for ongoing post-laryngectomy care. He needs encouragement to use his electrolarynx, needing Min cues for intelligibility at the phrase level when he does communicate with it. Pt was not sure when his larytube was last cleaned other than using suction on it, so SLP set him up with supplies to clean it himself. Upon removal, there were thick secretions almost filling the tube. Encouraged pt on taking charge of his own stoma care and making sure that he is cleaning it regularly even while he's here, as he is the one to take care of it when he goes home. Min cues were needed throughout the process of cleaning and replacing it, and he was very motivated to do it himself. Will continue to follow for education and training, anticipating potential discharge by the end of this week with swallowing test scheduled by ENT for tomorrow.    HPI HPI: Pt is a 74 yo male scheduled for total laryngectomy but presented early due to worsening SOB and noisy breathing. 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 Per ENT note 9/20, pt had a lesion involving the entire glottis circumferentially. Pt underwent awake trach followed by total laryngectomy on 9/21. PMH also includes: HTN, MI, h/o smoking      SLP Plan  Continue with current plan of care       Recommendations                   Follow up Recommendations: Home health SLP;Other (comment) (transitioning to OP as able) SLP Visit Diagnosis: Aphonia (R49.1) Plan: Continue with current plan of care       GO                Osie Bond., M.A. [place] [facility] Pager  [phone] Office [phone]  09/22/2020, 3:40 PM

## 2020-09-22 NOTE — TOC Initial Note (Addendum)
Transition of Care [facility]) - Initial/Assessment Note    Patient Details  Name: Marcus Collier MRN: [id number] Date of Birth: June 12, 1946  Transition of Care [facility]) CM/SW Contact:    Marilu Favre, RN Phone Number: 09/22/2020, 12:06 PM  Clinical Narrative:                 Confirmed face sheet information with patient at bedside. Patient from home with wife.   NCM spoke to Dr Redmond Baseman yesterday, plan at discharge patient will only need suction machine, no humidified air or tube feeds.   Explained to patient . Patient concerned regarding the cost of the suction machine. NCM explained, [employer] will deliver portable suction machine and supplies to his hospital room prior to discharge and discuss any co pays etc.   NCM will start looking for home health RN. NCM explained to patient all education will be provided by hospital staff prior to discharge.   Cory with [facility] accepted referral for [facility], as long as patient does not discharge on tube feedings   Patient will take box of lary supplies at bedside home with him.   Prescription for more supplies has been faxed to [facility].   Secure chatted Laurena Spies regarding teaching . Confirmed teaching has been done and script faxed for supplies   Expected Discharge Plan: [place] Barriers to Discharge: Continued Medical Work up   Patient Goals and CMS Choice Patient states their goals for this hospitalization and ongoing recovery are:: to return to home CMS Medicare.gov Compare Post Acute Care list provided to:: Patient Choice offered to / list presented to : Patient  Expected Discharge Plan and Services Expected Discharge Plan: [place]   Discharge Planning Services: CM Consult Post Acute Care Choice: Home Health, Durable Medical Equipment Living arrangements for the past 2 months: Single Family Home                 DME Arranged: Suction DME Agency: [employer] Date DME Agency Contacted: 09/22/20 Time  DME Agency Contacted: 1204 Representative spoke with at DME Agency: Thedore Mins            Prior Living Arrangements/Services Living arrangements for the past 2 months: [place] Lives with:: Spouse Patient language and need for interpreter reviewed:: Yes Do you feel safe going back to the place where you live?: Yes      Need for Family Participation in Patient Care: Yes (Comment) Care giver support system in place?: Yes (comment)   Criminal Activity/Legal Involvement Pertinent to Current Situation/Hospitalization: No - Comment as needed  Activities of Daily Living Home Assistive Devices/Equipment: None ADL Screening (condition at time of admission) Patient's cognitive ability adequate to safely complete daily activities?: Yes Is the patient deaf or have difficulty hearing?: No Does the patient have difficulty seeing, even when wearing glasses/contacts?: No Does the patient have difficulty concentrating, remembering, or making decisions?: No Patient able to express need for assistance with ADLs?: Yes Does the patient have difficulty dressing or bathing?: No Independently performs ADLs?: Yes (appropriate for developmental age) Does the patient have difficulty walking or climbing stairs?: No Weakness of Legs: None Weakness of Arms/Hands: None  Permission Sought/Granted   Permission granted to share information with : No              Emotional Assessment Appearance:: Appears stated age Attitude/Demeanor/Rapport: Engaged Affect (typically observed): Accepting Orientation: : Oriented to Self, Oriented to Place, Oriented to  Time, Oriented to Situation Alcohol /  Substance Use: Not Applicable Psych Involvement: No (comment)  Admission diagnosis:  Stridor [R06.1] 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 Protein-calorie malnutrition, severe 09/16/2020  . S/P laryngectomy 09/14/2020  . Pre-op  evaluation 09/13/2020  . Coronary artery disease involving native coronary artery of native heart without angina pectoris 09/13/2020  . 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 Lesion of vocal cord 11/26/2012  . 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 [place] [address] Phone: [phone] Fax: [phone]     Social Determinants of Health (SDOH) Interventions    Readmission Risk Interventions No flowsheet data found.

## 2020-09-23 LAB — GLUCOSE, CAPILLARY
Glucose-Capillary: 125 mg/dL — ABNORMAL HIGH (ref 70–99)
Glucose-Capillary: 136 mg/dL — ABNORMAL HIGH (ref 70–99)
Glucose-Capillary: 139 mg/dL — ABNORMAL HIGH (ref 70–99)
Glucose-Capillary: 146 mg/dL — ABNORMAL HIGH (ref 70–99)
Glucose-Capillary: 152 mg/dL — ABNORMAL HIGH (ref 70–99)
Glucose-Capillary: 153 mg/dL — ABNORMAL HIGH (ref 70–99)
Glucose-Capillary: 185 mg/dL — ABNORMAL HIGH (ref 70–99)

## 2020-09-23 MED ORDER — BOOST / RESOURCE BREEZE PO LIQD CUSTOM
1.0000 | Freq: Three times a day (TID) | ORAL | Status: DC
  Administered 2020-09-23 – 2020-09-24 (×2): 1 via ORAL

## 2020-09-23 NOTE — Progress Notes (Signed)
Subjective: Doing well, no complaints.  Objective: Vital signs in last 24 hours: Temp:  [98.3 F (36.8 C)-98.8 F (37.1 C)] 98.3 F (36.8 C) (09/30 0419) Pulse Rate:  [67-78] 68 (09/30 0419) Resp:  [18-20] 20 (09/30 0419) BP: (110-146)/(57-86) 139/57 (09/30 0419) SpO2:  [93 %-97 %] 95 % (09/30 0419) FiO2 (%):  [21 %] 21 % (09/29 1113) Weight:  [66.4 kg] 66.4 kg (09/30 0455) Wt Readings from Last 1 Encounters:  09/23/20 66.4 kg    Intake/Output from previous day: 09/29 0701 - 09/30 0700 In: 2933.7 [I.V.:1154.2; NG/GT:1779.6] Out: 2105 [Urine:2075; Emesis/NG output:30] Intake/Output this shift: No intake/output data recorded.  General appearance: alert, cooperative and no distress Neck: incision clean and intact, stoma intact, Lary tube in place  No results for input(s): WBC, HGB, HCT, PLT in the last 72 hours.  No results for input(s): NA, K, CL, CO2, GLUCOSE, BUN, CREATININE, CALCIUM in the last 72 hours.  Medications: I have reviewed the patient's current medications.  Assessment/Plan: Laryngeal cancer s/p total laryngectomy  Gave him water to drink and he swallowed easily.  Will start thin liquid diet today.  Likely remove feeding tube tomorrow and discharge.   LOS: 9 days   Melida Quitter 09/23/2020, 8:08 AM

## 2020-09-23 NOTE — Progress Notes (Signed)
Nutrition Follow-up  DOCUMENTATION CODES:   Severe malnutrition in context of chronic illness  INTERVENTION:   Continue Osmolite 1.5 @ 55 ml/hr via cortrak tube  Prosource TF105ml TID  Provides2100kcal, 115gm protein, 1090ml free water daily  MVI with minerals daily  100 ml free water every 6 hours Total free water per tube: 1403 ml  -Boost Breeze po TID, each supplement provides 250 kcal and 9 grams of protein  -RD will follow for diet advancement and adjust supplement regimen as appropriate  NUTRITION DIAGNOSIS:   Severe Malnutrition related to chronic illness (cancer) as evidenced by severe fat depletion, moderate fat depletion, severe muscle depletion, moderate muscle depletion, energy intake < or equal to 75% for > or equal to 1 month, percent weight loss.  Ongoing  GOAL:   Patient will meet greater than or equal to 90% of their needs  Progressing   MONITOR:   PO intake, Supplement acceptance, Diet advancement, Labs, Weight trends, Skin, I & O's  REASON FOR ASSESSMENT:   Consult, Ventilator Enteral/tube feeding initiation and management  ASSESSMENT:   Pt with PMH of T1N0 Glottic cancer s/p XRT in 2013 who continued to smoke until 8 months ago. Pt lost his voice 6 months ago and the last 2 months has had throat pain. 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  9/22- cortrak tube placed (tip: gastric), TF initiated 9/26- drains removed  Reviewed I/O's: +829 ml x 24 hours and +3.6 L since admission  UOP: 2.1 L x 24 hours  Pt receiving nursing care at time of visit.   Noted that pt was advanced to clear liquid diet per ENT today. Plan to advance diet and remove cortrak tube tomorrow in anticipation for discharge.   Labs  reviewed: CBGS: 125-185 (inpatient orders for glycemic control are 0-9 units insulin aspart every 4 hours).   Diet Order:   Diet Order            Diet clear liquid Room service appropriate? Yes; Fluid consistency: Thin  Diet effective now                 EDUCATION NEEDS:   No education needs have been identified at this time  Skin:  Skin Assessment: Skin Integrity Issues: Skin Integrity Issues:: Incisions Incisions: closed neck  Last BM:  Unknown  Height:   Ht Readings from Last 1 Encounters:  09/13/20 5' 8.5" (1.74 m)    Weight:   Wt Readings from Last 1 Encounters:  09/23/20 66.4 kg   BMI:  Body mass index is 21.93 kg/m.  Estimated Nutritional Needs:   Kcal:  2000-2300  Protein:  100-130 grams  Fluid:  >2 L/day    Loistine Chance, RD, LDN, CDCES Registered Dietitian II Certified Diabetes Care and Education Specialist Please refer to [facility] for RD and/or RD on-call/weekend/after hours pager

## 2020-09-23 NOTE — Progress Notes (Signed)
  Speech Language Pathology Treatment:  (post-laryngectomy)  Patient Details Name: Marcus Collier MRN: [id number] DOB: September 28, 1946 Today's Date: 09/23/2020 Time: 4585-9292 SLP Time Calculation (min) (ACUTE ONLY): 29 min  Assessment / Plan / Recommendation Clinical Impression  Pt was seen for additional training and education in anticipation of discharge as early as tomorrow. Pt had a few additional questions that were answered regarding maintenance going forward. He also asked to see his LPK and reviewed items in it with SLP to make sure he knew and understood all of its contents. Reminded him also that his prescription form with contact information has already been provided to [facility], so that they can follow up with additional supplies upon return home and in the future as needed. All questions were addressed at this time. Pt seems happy to be able to go home soon. This SLP will not be here tomorrow (pt made aware), but please contact our services if there are any linger needs prior to discharge.    HPI HPI: Pt is a 74 yo male scheduled for total laryngectomy but presented early due to worsening SOB and noisy breathing. 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 Per ENT note 9/20, pt had a lesion involving the entire glottis circumferentially. Pt underwent awake trach followed by total laryngectomy on 9/21. PMH also includes: HTN, MI, h/o smoking      SLP Plan  Continue with current plan of care       Recommendations                   Follow up Recommendations: Home health SLP (transitioning to OP when able) SLP Visit Diagnosis: Aphonia (R49.1) Plan: Continue with current plan of care       GO                Osie Bond., M.A. [place] [facility] Pager [phone] Office [phone]  09/23/2020, 9:37 AM

## 2020-09-24 LAB — GLUCOSE, CAPILLARY
Glucose-Capillary: 156 mg/dL — ABNORMAL HIGH (ref 70–99)
Glucose-Capillary: 120 mg/dL — ABNORMAL HIGH (ref 70–99)
Glucose-Capillary: 135 mg/dL — ABNORMAL HIGH (ref 70–99)

## 2020-09-24 NOTE — Discharge Summary (Signed)
Physician Discharge Summary  Patient ID: Marcus Collier MRN: [id number] DOB/AGE: [birth year] 74 y.o.  Admit date: 09/14/2020 Discharge date: 09/24/2020  Admission Diagnoses: Laryngeal cancer  Discharge Diagnoses:  Active Problems:   S/P laryngectomy   Protein-calorie malnutrition, severe   Discharged Condition: good  Hospital Course: 74 year old male with laryngeal cancer causing airway obstruction presented to the ER with difficulty breathing.  Laryngectomy had been planned but was performed upon admission.  See operative note.  Post-operatively, he was given nutrition via Coretrack nasogastric tube and received routine care.  This continued without problem until about POD 10 when he was given water to drink.  With no sign of difficulty or drainage, he was given a liquid diet that he tolerated well.  The next day, he was felt stable for discharge and the Coretrack tube was removed.  Staples were also removed.  He will be discharged on a liquid diet.  Consults: None  Significant Diagnostic Studies: None  Treatments: surgery: Total laryngectomy  Discharge Exam: Blood pressure (!) 128/58, pulse 72, temperature 98.5 F (36.9 C), temperature source Oral, resp. rate 16, weight 67.8 kg, SpO2 97 %. General appearance: alert, cooperative and no distress Neck: incision clean and intact, staples removed, stoma intact with Lary tube in place  Disposition: Discharge disposition: 01-Home or Self Care       Discharge Instructions    Diet - low sodium heart healthy   Complete by: As directed    Discharge instructions   Complete by: As directed    Liquid diet only.  Medicines by mouth.  Stoma care as instructed.  Use suction machine if needed.  No strenuous activity.  Tylenol and/or Motrin for pain.   Increase activity slowly   Complete by: As directed    No wound care   Complete by: As directed      Allergies as of 09/24/2020      Reactions   Penicillins Other (See Comments)    Unknown reaction/ Childhood      Medication List    TAKE these medications   acetaminophen 500 MG tablet Commonly known as: TYLENOL Take 500 mg by mouth every 6 (six) hours as needed for headache.   amLODipine 10 MG tablet Commonly known as: NORVASC Take 10 mg by mouth daily.   atorvastatin 80 MG tablet Commonly known as: LIPITOR Take 80 mg by mouth every evening.   hydrALAZINE 25 MG tablet Commonly known as: APRESOLINE Take 25 mg by mouth 2 (two) times daily.   quinapril 40 MG tablet Commonly known as: ACCUPRIL Take 40 mg by mouth daily.            Durable Medical Equipment  (From admission, onward)         Start     Ordered   09/22/20 1211  For home use only DME Suction  Once       Comments: 12 fr and 14 fr suction catheters and yankauer suction  Question:  Suction  Answer:  Other see comments   09/22/20 1212          Follow-up Information    [facility] Follow up.   Specialty: Home Health Services Contact information: [place] [address] [phone]        Izora Gala, MD Follow up on 10/05/2020.   Specialty: Otolaryngology Contact information: [address] [place] [address] [phone]  Signed: Melida Quitter 09/24/2020, 8:31 AM

## 2020-09-24 NOTE — Progress Notes (Signed)
Nutrition Follow-up  DOCUMENTATION CODES:   Severe malnutrition in context of chronic illness  INTERVENTION:   -D/c Boost Breeze po TID, each supplement provides 250 kcal and 9 grams of protein -Ensure Enlive po TID, each supplement provides 350 kcal and 20 grams of protein -MVI with minerals daily -Provided "Full Liquid Diet Nutrition Therapy" handout; attached to AVS/ discharge instructions  NUTRITION DIAGNOSIS:   Severe Malnutrition related to chronic illness (cancer) as evidenced by severe fat depletion, moderate fat depletion, severe muscle depletion, moderate muscle depletion, energy intake < or equal to 75% for > or equal to 1 month, percent weight loss.  Ongoing  GOAL:   Patient will meet greater than or equal to 90% of their needs  Progressing   MONITOR:   PO intake, Supplement acceptance, Diet advancement, Labs, Weight trends, Skin, I & O's  REASON FOR ASSESSMENT:   Consult, Ventilator Enteral/tube feeding initiation and management  ASSESSMENT:   Pt with PMH of T1N0 Glottic cancer s/p XRT in 2013 who continued to smoke until 8 months ago. Pt lost his voice 6 months ago and the last 2 months has had throat pain. 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  9/22- cortrak tube placed (tip: gastric), TF initiated 9/26- drains removed 10/1- cortrak removed, TF d/c  Reviewed I/O's: -3 L x 24 hours and +586 ml since admission  UOP: 3 L x 24 hours  Case discussed with RN, who reports pt is tolerating clear liquid diet well (pt with limited acceptance of Boost Breeze supplements). Per RN, plan to discharge home today on liquid diet. TF and cortrak tube have been removed.   Contacted Dr. Redmond Baseman for clarification on diet order. Pt will be unable to  meet nutritional needs on clear liquid diet alone. Suggest advancement to full liquids in order for pt to meet nutritional needs.   Labs reviewed: CBGS: 120-156.   Diet Order:   Diet Order            Diet - low sodium heart healthy           Diet clear liquid Room service appropriate? Yes; Fluid consistency: Thin  Diet effective now                 EDUCATION NEEDS:   No education needs have been identified at this time  Skin:  Skin Assessment: Skin Integrity Issues: Skin Integrity Issues:: Incisions Incisions: closed neck  Last BM:  Unknown  Height:   Ht Readings from Last 1 Encounters:  09/13/20 5' 8.5" (1.74 m)    Weight:   Wt Readings from Last 1 Encounters:  09/24/20 67.8 kg   BMI:  Body mass index is 22.4 kg/m.  Estimated Nutritional Needs:   Kcal:  2000-2300  Protein:  100-130 grams  Fluid:  >2 L/day    Loistine Chance, RD, LDN, CDCES Registered Dietitian II Certified Diabetes Care and Education Specialist Please refer to [facility] for RD and/or RD on-call/weekend/after hours pager

## 2020-09-24 NOTE — Discharge Planning (Signed)
[name] to be D/C'd  per MD order. Discussed with the patient and all questions fully answered.  VSS, Skin clean, dry and intact without evidence of skin break down, no evidence of skin tears noted.  IV catheter discontinued intact. Site without signs and symptoms of complications. Dressing and pressure applied.  An After Visit Summary was printed and given to the patient. Patient received prescription.  D/c education completed with patient/family including follow up instructions, medication list, d/c activities limitations if indicated, with other d/c instructions as indicated by MD - patient able to verbalize understanding, all questions fully answered.   Patient instructed to return to ED, call 911, or call MD for any changes in condition.   Patient to be escorted via [place], and D/C home via private auto.

## 2020-09-24 NOTE — Discharge Instructions (Signed)
Full Liquid Nutrition Therapy  The full liquid diet includes mostly liquids (including milk) and some foods with small amounts of fiber. The full liquid diet can provide many of the nutrients your body needs, but it may not give enough vitamins, minerals, and fiber.  A fluid is anything that is liquid or anything that would melt if left at room temperature. You will need to count these foods and liquids--including any liquid used to take medication--as part of your daily fluid intake. Some examples are:  Coffee, tea, and other hot beverages   Gelatin   Gravy   Ice cream, sherbet, sorbet   Ice cubes, ice chips   Milk, liquid creamer   Nutritional supplements   Popsicles   Vegetable and fruit juices; fluid in canned fruit (fruit itself is not allowed)   Yogurt (without nuts, seeds, or fruit)   Soft drinks, lemonade, limeade   Soups (strained)   Syrup This diet should only be used temporarily during your recovery until it is safe for you to eat regular foods. Your RDN can help establish a nutritionally-balanced full liquid meal plan, if needed.   Tips  Determine the which foods/fluids from the list are most appealing to you.   Eat or drink your favorite flavors to help you better enjoy this diet.   Include milk-based or dairy-type fluids and juices. If you are lactose intolerant, choose nut and seed milks.   Eat 3 full liquid meals throughout the day and include a snack time between each meal.   Drink nutritional shakes in 6-8 ounce servings as part of a meal or between meals to make sure youre taking in enough calories. You can make fortified shakes for yourself or buy them premade at a store.  Foods Recommended Food Group Foods Recommended  Grains Thin hot cereal, such as cream of wheat  Dairy Milk: Nonfat, 1%, 2%, whole Soy milk, almond milk, rice milk, coconut milk, cashew milk Milkshakes Yogurt Custard Pudding  Vegetables Vegetable juice with or without  pulp Thin, pureed vegetable soups  Fruits Translucent fruit juices without pulp (apple, cranberry, grape)  Oils Almond, avocado, canola, cashew, corn, grapeseed, olive, safflower, sesame, soybean, sunflower Butter (melted) Margarine (melted) that does not contain trans fat (read the product label)  Other Flavored gelatin (any flavor) Strained cream soups Chicken, beef, or vegetable broths Popsicle  Beverages Water Ice Soda Tea Coffee Nutritional supplements or shakes  A high-protein shake should contain at least 8-10 grams of protein per serving. Read product labels to find a shake that is high in protein. If you are preparing the shake at home, you can increase the amount of protein by adding protein powder, non-fat dry milk powder, yogurt, or low-fat milk.   Foods Not Recommended Food Group Foods Not Recommended  Grains All grain foods including whole grains, processed grains such as pasta, rice, cold cereals, bread, snacks, and sweets that are flour-based (cakes, cookies)  Protein Foods Beef and pork (all cuts) Chicken and [place] (all cuts) Fish (all types) Nuts and nut butters (all types) Eggs (all types) All meat substitutes (such as soy and tofu) All old cuts or lunch meat (such as salami, ham) Sausage (all types)  Dairy Hard cheese Yogurt with fruit chunks  Vegetables Whole, frozen, fresh, canned varieties   Fruits Whole, frozen, fresh, canned varieties   Oils Butter Coconut oil Palm Oil Lard   Full Liquid Nutrition Therapy Sample 1-Day Menu  Breakfast 1/2 cup orange juice (without pulp)  1 cup  cream of wheat  1 cup skim milk  6 ounces nonfat yogurt (without nuts, seeds, or fruit)  8 ounces coffee  Lunch 1 cup apple juice  1 cup tomato soup  1/2 cup chocolate pudding  1 cup high protein chocolate shake  8 ounces tea  Evening Meal 1/2 cup grape juice  1 cup skim milk  1 cup high-protein vanilla shake  1 cup strained, blended cream of broccoli soup  1/4 cup  custard  Evening Snack 1 cup high-protein strawberry shake (without seeds)   Copyright 2021  Academy of Nutrition and Dietetics. All rights reserved.  [facility] Stay Proper nutrition can help your body recover from illness and injury.   Foods and beverages high in protein, vitamins, and minerals help rebuild muscle loss, promote healing, & reduce fall risk.   In addition to eating healthy foods, a nutrition shake is an easy, delicious way to get the nutrition you need during and after your hospital stay  It is recommended that you continue to drink 3 bottles per day of:       Ensure Plus for at least 1 month (30 days) after your hospital stay   Tips for adding a nutrition shake into your routine: As allowed, drink one with vitamins or medications instead of water or juice Enjoy one as a tasty mid-morning or afternoon snack Drink cold or make a milkshake out of it Drink one instead of milk with cereal or snacks Use as a coffee creamer   Available at the following grocery stores and pharmacies:           * [place] [phone]            For COUPONS visit: www.ensure.com/join or http://dawson-may.com/   Suggested Substitutions Ensure Plus = Boost Plus = Carnation Breakfast Essentials = Boost Compact Ensure Active Clear = Boost Breeze Glucerna Shake = Boost Glucose Control = Carnation Breakfast Essentials SUGAR FREE

## 2020-09-27 ENCOUNTER — Inpatient Hospital Stay (HOSPITAL_COMMUNITY)
Admission: AD | Admit: 2020-09-27 | Discharge: 2020-10-02 | DRG: 921 | Disposition: A | Discharge status: Home Health | Payer: Medicare Other | Source: Ambulatory Visit | Attending: Otolaryngology | Admitting: Otolaryngology

## 2020-09-27 DIAGNOSIS — Z8249 Family history of ischemic heart disease and other diseases of the circulatory system: Secondary | ICD-10-CM | POA: Diagnosis not present

## 2020-09-27 DIAGNOSIS — Z9002 Acquired absence of larynx: Secondary | ICD-10-CM

## 2020-09-27 DIAGNOSIS — Z87891 Personal history of nicotine dependence: Secondary | ICD-10-CM | POA: Diagnosis not present

## 2020-09-27 DIAGNOSIS — Z79899 Other long term (current) drug therapy: Secondary | ICD-10-CM

## 2020-09-27 DIAGNOSIS — Z880 Allergy status to penicillin: Secondary | ICD-10-CM

## 2020-09-27 DIAGNOSIS — I10 Essential (primary) hypertension: Secondary | ICD-10-CM | POA: Diagnosis present

## 2020-09-27 DIAGNOSIS — Z85828 Personal history of other malignant neoplasm of skin: Secondary | ICD-10-CM

## 2020-09-27 DIAGNOSIS — T8183XA Persistent postprocedural fistula, initial encounter: Principal | ICD-10-CM | POA: Diagnosis present

## 2020-09-27 DIAGNOSIS — R1310 Dysphagia, unspecified: Secondary | ICD-10-CM | POA: Diagnosis present

## 2020-09-27 DIAGNOSIS — Z9861 Coronary angioplasty status: Secondary | ICD-10-CM | POA: Diagnosis not present

## 2020-09-27 DIAGNOSIS — I252 Old myocardial infarction: Secondary | ICD-10-CM | POA: Diagnosis not present

## 2020-09-27 DIAGNOSIS — Z923 Personal history of irradiation: Secondary | ICD-10-CM | POA: Diagnosis not present

## 2020-09-27 DIAGNOSIS — C329 Malignant neoplasm of larynx, unspecified: Secondary | ICD-10-CM | POA: Diagnosis present

## 2020-09-27 DIAGNOSIS — J392 Other diseases of pharynx: Secondary | ICD-10-CM | POA: Diagnosis present

## 2020-09-27 DIAGNOSIS — I2510 Atherosclerotic heart disease of native coronary artery without angina pectoris: Secondary | ICD-10-CM | POA: Diagnosis present

## 2020-09-27 MED ORDER — SODIUM CHLORIDE 0.9% FLUSH: 10.0000 mL | INTRAVENOUS | Status: DC | PRN

## 2020-09-27 MED ORDER — ONDANSETRON HCL 4 MG/2ML IJ SOLN: 4.0000 mg | Freq: Four times a day (QID) | INTRAMUSCULAR | Status: DC | PRN

## 2020-09-27 MED ORDER — CHLORHEXIDINE GLUCONATE CLOTH 2 % EX PADS
6.0000 | MEDICATED_PAD | Freq: Every day | CUTANEOUS | Status: DC
  Administered 2020-09-27 – 2020-09-28 (×2): 6 via TOPICAL

## 2020-09-27 MED ORDER — MORPHINE SULFATE (PF) 2 MG/ML IV SOLN
2.0000 mg | INTRAVENOUS | Status: DC | PRN
  Administered 2020-09-28 – 2020-10-02 (×12): 2 mg via INTRAVENOUS
  Filled 2020-09-27 (×15): qty 1

## 2020-09-27 MED ORDER — KCL IN DEXTROSE-NACL 20-5-0.45 MEQ/L-%-% IV SOLN
INTRAVENOUS | Status: DC
  Filled 2020-09-27 (×10): qty 1000

## 2020-09-27 NOTE — H&P (Signed)
Marcus Collier is an 74 y.o. male.   Chief Complaint: Pharyngocutaneous fistula HPI: 74 year old male with history of laryngeal cancer with recurrence following radiation treatments.  He underwent total laryngectomy by Dr. Constance Holster on 9/21 and remained in the hospital until late last week after he was allowed to swallow on POD 10.  Yesterday, he noticed some bleeding at the stoma and then some leaking of swallowed material out of the neck incision.  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  Past Medical History:  Diagnosis Date  . Acute myocardial infarction ([place])   . Aortic stenosis    mild AS 09/13/20 echo [facility])  . 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 Family history of adverse reaction to anesthesia   . Headache    migraine  . Hoarseness of voice   . Hypertension   . 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 S/P radiation therapy 12/10/2012 - 01/20/2013   Vocal Cords/ 63 Gray/ 28 Fractions  . Unspecified malignant neoplasm of skin, site unspecified     Past Surgical History:  Procedure Laterality Date  . Biopsy of Vocal cord  11/15/12   Right and Left  . Biospy mouth  11/15/12  . CARDIAC CATHETERIZATION     with stents   . CORONARY ANGIOPLASTY    . DIRECT LARYNGOSCOPY N/A 08/11/2020   Procedure: DIRECT LARYNGOSCOPY WITH BIOPSY;  Surgeon: Izora Gala, MD;  Location: [place];  Service: ENT;  Laterality: N/A;  Harvie Junior N/A 09/14/2020   Procedure: LARYNGECTOMY Total;  Surgeon: Izora Gala, MD;  Location: [place];  Service: ENT;  Laterality: N/A;  NEEDS RNFA    Family History  Problem Relation Age of Onset  . Heart attack Mother   . Heart attack Father   . Hypertension Father    Social History:  reports that he quit smoking about 9 months ago. His smoking use included cigarettes. He has a 52.00 pack-year smoking history. He has never used smokeless tobacco. He reports previous alcohol use.  He reports that he does not use drugs.  Allergies:  Allergies  Allergen Reactions  . Penicillins Other (See Comments)    Unknown reaction/ Childhood    Medications Prior to Admission  Medication Sig Dispense Refill  . acetaminophen (TYLENOL) 500 MG tablet Take 500 mg by mouth every 6 (six) hours as needed for headache.    Marland Kitchen amLODipine (NORVASC) 10 MG tablet Take 10 mg by mouth every morning.     Marland Kitchen atorvastatin (LIPITOR) 80 MG tablet Take 80 mg by mouth every evening.    . hydrALAZINE (APRESOLINE) 25 MG tablet Take 25 mg by mouth 2 (two) times daily.    . quinapril (ACCUPRIL) 40 MG tablet Take 40 mg by mouth every morning.       No results found for this or any previous visit (from the past 48 hour(s)). No results found.  Review of Systems  All other systems reviewed and are negative.   Blood pressure 105/68, pulse 70, temperature 97.8 F (36.6 C), temperature source Oral, resp. rate 14, SpO2 97 %. Physical Exam Constitutional:      Appearance: Normal appearance. He is normal weight.     Comments: Aphonic  HENT:     Head: Normocephalic and atraumatic.     Right Ear: External ear normal.     Left Ear: External ear normal.     Nose: Nose normal.  Mouth/Throat:     Mouth: Mucous membranes are moist.     Pharynx: Oropharynx is clear.  Eyes:     Extraocular Movements: Extraocular movements intact.     Conjunctiva/sclera: Conjunctivae normal.     Pupils: Pupils are equal, round, and reactive to light.  Neck:     Comments: Neck incision with central opening with saliva coming through.  Stoma intact with Lary tube in place. Cardiovascular:     Rate and Rhythm: Normal rate.  Pulmonary:     Effort: Pulmonary effort is normal.  Musculoskeletal:     Cervical back: Normal range of motion.  Skin:    General: Skin is warm and dry.  Neurological:     General: No focal deficit present.     Mental Status: He is alert and oriented to person, place, and time.  Psychiatric:         Mood and Affect: Mood normal.        Behavior: Behavior normal.        Thought Content: Thought content normal.        Judgment: Judgment normal.      Assessment/Plan Pharyngocutaneous fistula s/p total laryngectomy  Admit for IV fluids and consultation for G-tube placement by IR.  Will consult plastic surgery about potential pectoralis flap wound management.  Melida Quitter, MD 09/27/2020, 6:36 PM

## 2020-09-28 ENCOUNTER — Inpatient Hospital Stay (HOSPITAL_COMMUNITY): Payer: Medicare Other

## 2020-09-28 ENCOUNTER — Encounter (HOSPITAL_COMMUNITY): Payer: Self-pay | Admitting: Otolaryngology

## 2020-09-28 HISTORY — PX: GASTROSTOMY TUBE PLACEMENT: SHX655

## 2020-09-28 HISTORY — PX: IR GASTROSTOMY TUBE MOD SED: IMG625

## 2020-09-28 MED ORDER — FENTANYL CITRATE (PF) 100 MCG/2ML IJ SOLN
INTRAMUSCULAR | Status: AC
  Filled 2020-09-28: qty 2

## 2020-09-28 MED ORDER — CLINDAMYCIN PHOSPHATE 900 MG/50ML IV SOLN: 900.0000 mg | Freq: Once | INTRAVENOUS | Status: DC

## 2020-09-28 MED ORDER — LIDOCAINE-EPINEPHRINE 1 %-1:100000 IJ SOLN
INTRAMUSCULAR | Status: AC
  Filled 2020-09-28: qty 1

## 2020-09-28 MED ORDER — FENTANYL CITRATE (PF) 100 MCG/2ML IJ SOLN
INTRAMUSCULAR | Status: DC
Start: 2020-09-28 — End: 2020-09-28
  Filled 2020-09-28: qty 2

## 2020-09-28 MED ORDER — VANCOMYCIN HCL IN DEXTROSE 1-5 GM/200ML-% IV SOLN
INTRAVENOUS | Status: AC
  Filled 2020-09-28: qty 200

## 2020-09-28 MED ORDER — GLUCAGON HCL RDNA (DIAGNOSTIC) 1 MG IJ SOLR
INTRAMUSCULAR | Status: AC
  Filled 2020-09-28: qty 1

## 2020-09-28 MED ORDER — LIDOCAINE HCL (PF) 1 % IJ SOLN
INTRAMUSCULAR | Status: AC | PRN
  Administered 2020-09-28: 5 mL

## 2020-09-28 MED ORDER — VANCOMYCIN HCL IN DEXTROSE 1-5 GM/200ML-% IV SOLN
1000.0000 mg | Freq: Once | INTRAVENOUS | Status: AC
  Administered 2020-09-28: 1000 mg via INTRAVENOUS
  Filled 2020-09-28: qty 200

## 2020-09-28 MED ORDER — FENTANYL CITRATE (PF) 100 MCG/2ML IJ SOLN
INTRAMUSCULAR | Status: AC | PRN
  Administered 2020-09-28 (×4): 25 ug via INTRAVENOUS

## 2020-09-28 MED ORDER — IOHEXOL 300 MG/ML  SOLN
50.0000 mL | Freq: Once | INTRAMUSCULAR | Status: AC | PRN
  Administered 2020-09-28: 20 mL

## 2020-09-28 MED ORDER — MIDAZOLAM HCL 2 MG/2ML IJ SOLN
INTRAMUSCULAR | Status: AC | PRN
  Administered 2020-09-28: 1 mg via INTRAVENOUS
  Administered 2020-09-28: 0.5 mg via INTRAVENOUS
  Administered 2020-09-28: 1 mg via INTRAVENOUS
  Administered 2020-09-28: 0.5 mg via INTRAVENOUS

## 2020-09-28 MED ORDER — MIDAZOLAM HCL 2 MG/2ML IJ SOLN
INTRAMUSCULAR | Status: AC
  Filled 2020-09-28: qty 2

## 2020-09-28 MED ORDER — GLUCAGON HCL RDNA (DIAGNOSTIC) 1 MG IJ SOLR
INTRAMUSCULAR | Status: AC | PRN
  Administered 2020-09-28: 1 mg via INTRAVENOUS

## 2020-09-28 NOTE — Progress Notes (Signed)
Subjective: No complaints.  Objective: Vital signs in last 24 hours: Temp:  [97.8 F (36.6 C)-98.8 F (37.1 C)] 98.8 F (37.1 C) (10/05 0437) Pulse Rate:  [63-70] 65 (10/05 0500) Resp:  [14-18] 18 (10/05 0500) BP: (105-132)/(57-70) 128/70 (10/05 0437) SpO2:  [96 %-97 %] 96 % (10/05 0500) Wt Readings from Last 1 Encounters:  09/24/20 67.8 kg    Intake/Output from previous day: 10/04 0701 - 10/05 0700 In: 744.7 [I.V.:744.7] Out: -  Intake/Output this shift: No intake/output data recorded.  General appearance: alert, cooperative and no distress Neck: bandage over fistula holding saliva fairly well, stoma intact  No results for input(s): WBC, HGB, HCT, PLT in the last 72 hours.  No results for input(s): NA, K, CL, CO2, GLUCOSE, BUN, CREATININE, CALCIUM in the last 72 hours.  Medications: I have reviewed the patient's current medications.  Assessment/Plan: Pharyngocutaneous fistula s/p laryngectomy  IR to get an abdominal CT today, possible G-tube placement tomorrow.  He is content to wait for the G-tube to initiate nutrition.  Plastics to see him today.   LOS: 1 day   Melida Quitter 09/28/2020, 7:42 AM

## 2020-09-28 NOTE — Procedures (Deleted)
Pre Procedure Dx: Liver lesion Post Procedural Dx: Same  Technically successful US guided biopsy of right lobe of the liver.   EBL: None No immediate complications.   Ronny Bacon, MD Pager #: [phone]

## 2020-09-28 NOTE — Plan of Care (Signed)
  Problem: Education: Goal: Knowledge of General Education information will improve Description Including pain rating scale, medication(s)/side effects and non-pharmacologic comfort measures Outcome: Progressing   

## 2020-09-28 NOTE — H&P (View-Only) (Signed)
Reason for Consult/CC: Pharyngocutaneous fistula  Marcus Collier is an 74 y.o. male.  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  He was previously radiated.  Sometime in the last few days he developed a opening superior to his tracheostomy site and saliva-like fluid began to drain from it.  I have been consulted for assistance in closure of this with nonradiated tissue.  Allergies:  Allergies  Allergen Reactions  . Penicillins Other (See Comments)    Unknown reaction/ Childhood    Medications:  Current Facility-Administered Medications:  .  Chlorhexidine Gluconate Cloth 2 % PADS 6 each, 6 each, Topical, Daily, Melida Quitter, MD, 6 each at 09/28/20 1029 .  dextrose 5 % and 0.45 % NaCl with KCl 20 mEq/L infusion, , Intravenous, Continuous, Melida Quitter, MD, Last Rate: 110 mL/hr at 09/28/20 1534, New Bag at 09/28/20 1534 .  fentaNYL (SUBLIMAZE) 100 MCG/2ML injection, , , ,  .  glucagon (human recombinant) (GLUCAGEN) 1 MG injection, , , ,  .  lidocaine-EPINEPHrine (XYLOCAINE W/EPI) 1 %-1:100000 (with pres) injection, , , ,  .  midazolam (VERSED) 2 MG/2ML injection, , , ,  .  midazolam (VERSED) 2 MG/2ML injection, , , ,  .  morphine 2 MG/ML injection 2 mg, 2 mg, Intravenous, Q2H PRN, Melida Quitter, MD, 2 mg at 09/28/20 1535 .  ondansetron (ZOFRAN) injection 4 mg, 4 mg, Intravenous, Q6H PRN, Melida Quitter, MD .  sodium chloride flush (NS) 0.9 % injection 10-40 mL, 10-40 mL, Intracatheter, PRN, Melida Quitter, MD .  vancomycin [facility]) 1-5 GM/200ML-% IVPB, , , ,   Past Medical History:  Diagnosis Date  . Acute myocardial infarction ([place])   . Aortic stenosis    mild AS 09/13/20 echo [facility])  . 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 Family history of adverse reaction to anesthesia   . Headache    migraine  . Hoarseness of voice   . Hypertension   . 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 S/P radiation therapy 12/10/2012 - 01/20/2013   Vocal Cords/ 63 Gray/ 28 Fractions  . Unspecified malignant neoplasm of skin, site unspecified     Past Surgical History:  Procedure Laterality Date  . Biopsy of Vocal cord  11/15/12   Right and Left  . Biospy mouth  11/15/12  . CARDIAC CATHETERIZATION     with stents   . CORONARY ANGIOPLASTY    . DIRECT LARYNGOSCOPY N/A 08/11/2020   Procedure: DIRECT LARYNGOSCOPY WITH BIOPSY;  Surgeon: Izora Gala, MD;  Location: [facility];  Service: ENT;  Laterality: N/A;  . IR GASTROSTOMY TUBE MOD SED  09/28/2020  . LARYNGETOMY N/A 09/14/2020   Procedure: LARYNGECTOMY Total;  Surgeon: Izora Gala, MD;  Location: [facility] OR;  Service: ENT;  Laterality: N/A;  NEEDS RNFA    Family History  Problem Relation Age of Onset  . Heart attack Mother   . Heart attack Father   . Hypertension Father     Social History:  reports that he quit smoking about 9 months ago. His smoking use included cigarettes. He has a 52.00 pack-year smoking history. He has never used smokeless tobacco. He reports previous alcohol use. He reports that he does not use drugs.   Physical Exam Blood pressure (!) 116/52, pulse 63, temperature 98.1 F (36.7 C), temperature source Oral, resp. rate 18, SpO2 97 %. General: No  acute distress.  Alert and interactive with his wife at bedside. Examination of the neck shows fibrotic skin surrounding a 1 cm opening draining clear fluid.  This is several centimeters superior to his tracheostomy.  There is no erythema or purulence.  There is no scarring of the anterior chest on either side.  No results found for this or any previous visit (from the past 48 hour(s)).  CT ABDOMEN WO CONTRAST  Result Date: 09/28/2020 CLINICAL DATA:  Evaluate gastric anatomy prior to potential percutaneous gastrostomy tube placement. EXAM: CT ABDOMEN WITHOUT CONTRAST TECHNIQUE: Multidetector CT imaging of the abdomen was performed  following the standard protocol without IV contrast. COMPARISON:  None. FINDINGS: The lack of intravenous contrast limits the ability to evaluate solid abdominal organs. Lower chest: Limited visualization of the lower thorax demonstrates minimal bibasilar subsegmental atelectasis. No discrete focal airspace opacities. No pleural effusion. Normal heart size. Coronary artery calcifications. No pericardial effusion. Hepatobiliary: Normal hepatic contour. Normal noncontrast appearance of the gallbladder given degree of distention. No radiopaque gallstones. No ascites. Pancreas: Normal noncontrast appearance of the pancreas. Spleen: Normal noncontrast appearance of the spleen. Adrenals/Urinary Tract: Calcifications about the bilateral renal hila are favored to be vascular in etiology. No definite renal stones. There is a minimal amount of grossly symmetric likely age and body habitus related bilateral perinephric stranding. No urinary obstruction. Normal noncontrast appearance the bilateral adrenal glands. The urinary bladder was not imaged. Stomach/Bowel: The ventral wall of the distal body of the stomach/gastric antrum is well apposed against the anterior wall of the upper abdomen without interposition of the hepatic parenchyma or transverse colon. Note is made of several slightly interposed loops of small bowel though the percutaneous window is likely to be improved with gastric insufflation. Large colonic stool burden without evidence of enteric obstruction. No pneumoperitoneum, pneumatosis or portal venous gas. Vascular/Lymphatic: Bilobed infrarenal abdominal aortic aneurysm with cranial component measuring 3.6 x 3.6 cm as measured in greatest oblique short axis axial (image 30, series 3) and coronal (image 71, series 6) dimensions respectively, and caudal component measuring approximately 3.2 x 3.6 cm (axial image 37, series 3; coronal image 72, series 6). No bulky retroperitoneal or mesenteric lymphadenopathy.  Other: There is a minimal amount of subcutaneous edema about the midline of the low back. Musculoskeletal: No acute or aggressive osseous abnormalities. 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 IMPRESSION: 1. Gastric anatomy amenable to attempted percutaneous gastrostomy tube placement as indicated with percutaneous window likely be improved with gastric insufflation. 2. Bilobed infrarenal abdominal aortic aneurysm, with dominant cranial component measuring 3.6 cm in diameter, incompletely evaluated on this noncontrast examination. Further evaluation with nonemergent contrast enhance CTA of the abdomen pelvis could be performed as indicated. Aortic aneurysm NOS (ICD10-I71.9). 3. Coronary artery calcifications. Aortic Atherosclerosis (ICD10-I70.0). Electronically Signed   By: Sandi Mariscal M.D.   On: 09/28/2020 11:32   IR GASTROSTOMY TUBE MOD SED  Result Date: 09/28/2020 INDICATION: History of head neck cancer, now with dysphagia. Please perform percutaneous gastrostomy tube placement for enteric nutrition supplementation purposes. EXAM: PULL TROUGH GASTROSTOMY TUBE PLACEMENT COMPARISON:  Abdominal CT-earlier same day MEDICATIONS: Vancomycin 1 gm IV; Antibiotics were administered within 1 hour of the procedure. Glucagon 1 mg IV CONTRAST:  20 mL of Omnipaque 300 administered into the gastric lumen. ANESTHESIA/SEDATION: Moderate (conscious) sedation was employed during this procedure. A total of Versed 3 mg was administered intravenously. Moderate Sedation Time: 35 minutes. The patient's level of consciousness and vital signs were monitored continuously by radiology nursing  throughout the procedure under my direct supervision. FLUOROSCOPY TIME:  4 minutes, 48 seconds (37 mGy) COMPLICATIONS: None immediate. PROCEDURE: Informed written consent was obtained from the patient following explanation of the procedure, risks, benefits and alternatives. A time out was performed prior to the initiation of the  procedure. Ultrasound scanning was performed to demarcate the edge of the left lobe of the liver. Maximal barrier sterile technique utilized including caps, mask, sterile gowns, sterile gloves, large sterile drape, hand hygiene and Betadine prep. The left upper quadrant was sterilely prepped and draped. An oral gastric catheter was inserted into the stomach under fluoroscopy. The existing nasogastric feeding tube was removed. The left costal margin and air opacified transverse colon were identified and avoided. Air was injected into the stomach for insufflation and visualization under fluoroscopy. Under sterile conditions a 17 gauge trocar needle was utilized to access the stomach percutaneously beneath the left subcostal margin after the overlying soft tissues were anesthetized with 1% Lidocaine with epinephrine. Needle position was confirmed within the stomach with aspiration of air and injection of small amount of contrast. A single T tack was deployed for gastropexy. Over an Amplatz guide wire, a 9-French sheath was inserted into the stomach. A snare device was utilized to capture the oral gastric catheter. The snare device was pulled retrograde from the stomach up the esophagus and out the oropharynx. The 20-French pull-through gastrostomy was connected to the snare device and pulled antegrade through the oropharynx down the esophagus into the stomach and then through the percutaneous tract external to the patient. The gastrostomy was assembled externally. Contrast injection confirms position in the stomach. Several spot radiographic images were obtained in various obliquities for documentation. The patient tolerated procedure well without immediate post procedural complication. FINDINGS: After successful fluoroscopic guided placement, the gastrostomy tube is appropriately positioned with internal disc against the ventral aspect of the gastric lumen. IMPRESSION: Successful fluoroscopic insertion of a 20-French  pull-through gastrostomy tube. The gastrostomy may be used immediately for medication administration and in 24 hrs for the initiation of feeds. Electronically Signed   By: Sandi Mariscal M.D.   On: 09/28/2020 15:40    Assessment/Plan: Patient presents with what looks like a pharyngocutaneous fistula in the setting of previous radiation.  I explained the challenges of this situation to the patient and his wife.  I do believe that a joint case with ENT utilizing a myocutaneous pectoralis major flap would adequately treat this problem.  I explained to the patient that even under optimal circumstances further breakdown could occur.  We discussed other risks of the procedure that include bleeding, infection, damage to surrounding structures and need for additional procedures.  I will communicate with the ENT team and will be available to help them with closure.  Cindra Presume 09/28/2020, 4:51 PM

## 2020-09-28 NOTE — Procedures (Signed)
Pre procedure Dx: Dysphagia Post Procedure Dx: Same  Successful fluoroscopic guided insertion of gastrostomy tube.   The gastrostomy tube may be used immediately for medications.   Tube feeds may be initiated in 24 hours as per the primary team.    EBL: Minimal  Complications: None immediate  Jay Kharisma Glasner, MD Pager #: [phone]    

## 2020-09-28 NOTE — Consult Note (Signed)
Reason for Consult/CC: Pharyngocutaneous fistula  Marcus Collier is an 74 y.o. male.  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  He was previously radiated.  Sometime in the last few days he developed a opening superior to his tracheostomy site and saliva-like fluid began to drain from it.  I have been consulted for assistance in closure of this with nonradiated tissue.  Allergies:  Allergies  Allergen Reactions  . Penicillins Other (See Comments)    Unknown reaction/ Childhood    Medications:  Current Facility-Administered Medications:  .  Chlorhexidine Gluconate Cloth 2 % PADS 6 each, 6 each, Topical, Daily, Melida Quitter, MD, 6 each at 09/28/20 1029 .  dextrose 5 % and 0.45 % NaCl with KCl 20 mEq/L infusion, , Intravenous, Continuous, Melida Quitter, MD, Last Rate: 110 mL/hr at 09/28/20 1534, New Bag at 09/28/20 1534 .  fentaNYL (SUBLIMAZE) 100 MCG/2ML injection, , , ,  .  glucagon (human recombinant) (GLUCAGEN) 1 MG injection, , , ,  .  lidocaine-EPINEPHrine (XYLOCAINE W/EPI) 1 %-1:100000 (with pres) injection, , , ,  .  midazolam (VERSED) 2 MG/2ML injection, , , ,  .  midazolam (VERSED) 2 MG/2ML injection, , , ,  .  morphine 2 MG/ML injection 2 mg, 2 mg, Intravenous, Q2H PRN, Melida Quitter, MD, 2 mg at 09/28/20 1535 .  ondansetron (ZOFRAN) injection 4 mg, 4 mg, Intravenous, Q6H PRN, Melida Quitter, MD .  sodium chloride flush (NS) 0.9 % injection 10-40 mL, 10-40 mL, Intracatheter, PRN, Melida Quitter, MD .  vancomycin [facility]) 1-5 GM/200ML-% IVPB, , , ,   Past Medical History:  Diagnosis Date  . Acute myocardial infarction ([place])   . Aortic stenosis    mild AS 09/13/20 echo [facility])  . 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 Family history of adverse reaction to anesthesia   . Headache    migraine  . Hoarseness of voice   . Hypertension   . 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 S/P radiation therapy 12/10/2012 - 01/20/2013   Vocal Cords/ 63 Gray/ 28 Fractions  . Unspecified malignant neoplasm of skin, site unspecified     Past Surgical History:  Procedure Laterality Date  . Biopsy of Vocal cord  11/15/12   Right and Left  . Biospy mouth  11/15/12  . CARDIAC CATHETERIZATION     with stents   . CORONARY ANGIOPLASTY    . DIRECT LARYNGOSCOPY N/A 08/11/2020   Procedure: DIRECT LARYNGOSCOPY WITH BIOPSY;  Surgeon: Izora Gala, MD;  Location: [facility];  Service: ENT;  Laterality: N/A;  . IR GASTROSTOMY TUBE MOD SED  09/28/2020  . LARYNGETOMY N/A 09/14/2020   Procedure: LARYNGECTOMY Total;  Surgeon: Izora Gala, MD;  Location: [facility] OR;  Service: ENT;  Laterality: N/A;  NEEDS RNFA    Family History  Problem Relation Age of Onset  . Heart attack Mother   . Heart attack Father   . Hypertension Father     Social History:  reports that he quit smoking about 9 months ago. His smoking use included cigarettes. He has a 52.00 pack-year smoking history. He has never used smokeless tobacco. He reports previous alcohol use. He reports that he does not use drugs.   Physical Exam Blood pressure (!) 116/52, pulse 63, temperature 98.1 F (36.7 C), temperature source Oral, resp. rate 18, SpO2 97 %. General: No  acute distress.  Alert and interactive with his wife at bedside. Examination of the neck shows fibrotic skin surrounding a 1 cm opening draining clear fluid.  This is several centimeters superior to his tracheostomy.  There is no erythema or purulence.  There is no scarring of the anterior chest on either side.  No results found for this or any previous visit (from the past 48 hour(s)).  CT ABDOMEN WO CONTRAST  Result Date: 09/28/2020 CLINICAL DATA:  Evaluate gastric anatomy prior to potential percutaneous gastrostomy tube placement. EXAM: CT ABDOMEN WITHOUT CONTRAST TECHNIQUE: Multidetector CT imaging of the abdomen was performed  following the standard protocol without IV contrast. COMPARISON:  None. FINDINGS: The lack of intravenous contrast limits the ability to evaluate solid abdominal organs. Lower chest: Limited visualization of the lower thorax demonstrates minimal bibasilar subsegmental atelectasis. No discrete focal airspace opacities. No pleural effusion. Normal heart size. Coronary artery calcifications. No pericardial effusion. Hepatobiliary: Normal hepatic contour. Normal noncontrast appearance of the gallbladder given degree of distention. No radiopaque gallstones. No ascites. Pancreas: Normal noncontrast appearance of the pancreas. Spleen: Normal noncontrast appearance of the spleen. Adrenals/Urinary Tract: Calcifications about the bilateral renal hila are favored to be vascular in etiology. No definite renal stones. There is a minimal amount of grossly symmetric likely age and body habitus related bilateral perinephric stranding. No urinary obstruction. Normal noncontrast appearance the bilateral adrenal glands. The urinary bladder was not imaged. Stomach/Bowel: The ventral wall of the distal body of the stomach/gastric antrum is well apposed against the anterior wall of the upper abdomen without interposition of the hepatic parenchyma or transverse colon. Note is made of several slightly interposed loops of small bowel though the percutaneous window is likely to be improved with gastric insufflation. Large colonic stool burden without evidence of enteric obstruction. No pneumoperitoneum, pneumatosis or portal venous gas. Vascular/Lymphatic: Bilobed infrarenal abdominal aortic aneurysm with cranial component measuring 3.6 x 3.6 cm as measured in greatest oblique short axis axial (image 30, series 3) and coronal (image 71, series 6) dimensions respectively, and caudal component measuring approximately 3.2 x 3.6 cm (axial image 37, series 3; coronal image 72, series 6). No bulky retroperitoneal or mesenteric lymphadenopathy.  Other: There is a minimal amount of subcutaneous edema about the midline of the low back. Musculoskeletal: No acute or aggressive osseous abnormalities. 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 IMPRESSION: 1. Gastric anatomy amenable to attempted percutaneous gastrostomy tube placement as indicated with percutaneous window likely be improved with gastric insufflation. 2. Bilobed infrarenal abdominal aortic aneurysm, with dominant cranial component measuring 3.6 cm in diameter, incompletely evaluated on this noncontrast examination. Further evaluation with nonemergent contrast enhance CTA of the abdomen pelvis could be performed as indicated. Aortic aneurysm NOS (ICD10-I71.9). 3. Coronary artery calcifications. Aortic Atherosclerosis (ICD10-I70.0). Electronically Signed   By: Sandi Mariscal M.D.   On: 09/28/2020 11:32   IR GASTROSTOMY TUBE MOD SED  Result Date: 09/28/2020 INDICATION: History of head neck cancer, now with dysphagia. Please perform percutaneous gastrostomy tube placement for enteric nutrition supplementation purposes. EXAM: PULL TROUGH GASTROSTOMY TUBE PLACEMENT COMPARISON:  Abdominal CT-earlier same day MEDICATIONS: Vancomycin 1 gm IV; Antibiotics were administered within 1 hour of the procedure. Glucagon 1 mg IV CONTRAST:  20 mL of Omnipaque 300 administered into the gastric lumen. ANESTHESIA/SEDATION: Moderate (conscious) sedation was employed during this procedure. A total of Versed 3 mg was administered intravenously. Moderate Sedation Time: 35 minutes. The patient's level of consciousness and vital signs were monitored continuously by radiology nursing  throughout the procedure under my direct supervision. FLUOROSCOPY TIME:  4 minutes, 48 seconds (37 mGy) COMPLICATIONS: None immediate. PROCEDURE: Informed written consent was obtained from the patient following explanation of the procedure, risks, benefits and alternatives. A time out was performed prior to the initiation of the  procedure. Ultrasound scanning was performed to demarcate the edge of the left lobe of the liver. Maximal barrier sterile technique utilized including caps, mask, sterile gowns, sterile gloves, large sterile drape, hand hygiene and Betadine prep. The left upper quadrant was sterilely prepped and draped. An oral gastric catheter was inserted into the stomach under fluoroscopy. The existing nasogastric feeding tube was removed. The left costal margin and air opacified transverse colon were identified and avoided. Air was injected into the stomach for insufflation and visualization under fluoroscopy. Under sterile conditions a 17 gauge trocar needle was utilized to access the stomach percutaneously beneath the left subcostal margin after the overlying soft tissues were anesthetized with 1% Lidocaine with epinephrine. Needle position was confirmed within the stomach with aspiration of air and injection of small amount of contrast. A single T tack was deployed for gastropexy. Over an Amplatz guide wire, a 9-French sheath was inserted into the stomach. A snare device was utilized to capture the oral gastric catheter. The snare device was pulled retrograde from the stomach up the esophagus and out the oropharynx. The 20-French pull-through gastrostomy was connected to the snare device and pulled antegrade through the oropharynx down the esophagus into the stomach and then through the percutaneous tract external to the patient. The gastrostomy was assembled externally. Contrast injection confirms position in the stomach. Several spot radiographic images were obtained in various obliquities for documentation. The patient tolerated procedure well without immediate post procedural complication. FINDINGS: After successful fluoroscopic guided placement, the gastrostomy tube is appropriately positioned with internal disc against the ventral aspect of the gastric lumen. IMPRESSION: Successful fluoroscopic insertion of a 20-French  pull-through gastrostomy tube. The gastrostomy may be used immediately for medication administration and in 24 hrs for the initiation of feeds. Electronically Signed   By: Sandi Mariscal M.D.   On: 09/28/2020 15:40    Assessment/Plan: Patient presents with what looks like a pharyngocutaneous fistula in the setting of previous radiation.  I explained the challenges of this situation to the patient and his wife.  I do believe that a joint case with ENT utilizing a myocutaneous pectoralis major flap would adequately treat this problem.  I explained to the patient that even under optimal circumstances further breakdown could occur.  We discussed other risks of the procedure that include bleeding, infection, damage to surrounding structures and need for additional procedures.  I will communicate with the ENT team and will be available to help them with closure.  Cindra Presume 09/28/2020, 4:51 PM

## 2020-09-28 NOTE — Consult Note (Addendum)
Chief Complaint: Patient was seen in consultation today for percutaneous gastrostomy tube placement  Referring Physician(s): Bates,D  Supervising Physician: Suttle,D  Patient Status: [facility] - In-pt  History of Present Illness: Marcus Collier is a 74 y.o. 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  He is status post total laryngectomy on 09/14/2020.  He has had some recent bleeding from the stoma as well as leaking of swallowed material out of the neck incision consistent with pharyngocutaneous fistula.  Request now received for percutaneous gastrostomy tube placement to assist with nutrition in the interim until fistula has resolved/surgically corrected.  Past Medical History:  Diagnosis Date  . Acute myocardial infarction ([place])   . Aortic stenosis    mild AS 09/13/20 echo [facility])  . 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 Family history of adverse reaction to anesthesia   . Headache    migraine  . Hoarseness of voice   . Hypertension   . 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 S/P radiation therapy 12/10/2012 - 01/20/2013   Vocal Cords/ 63 Gray/ 28 Fractions  . Unspecified malignant neoplasm of skin, site unspecified     Past Surgical History:  Procedure Laterality Date  . Biopsy of Vocal cord  11/15/12   Right and Left  . Biospy mouth  11/15/12  . CARDIAC CATHETERIZATION     with stents   . CORONARY ANGIOPLASTY    . DIRECT LARYNGOSCOPY N/A 08/11/2020   Procedure: DIRECT LARYNGOSCOPY WITH BIOPSY;  Surgeon: Izora Gala, MD;  Location: [facility];  Service: ENT;  Laterality: N/A;  Harvie Junior N/A 09/14/2020   Procedure: LARYNGECTOMY Total;  Surgeon: Izora Gala, MD;  Location: [facility];  Service: ENT;  Laterality: N/A;  NEEDS RNFA     Allergies: Penicillins  Medications: Prior to Admission medications   Medication Sig Start Date End Date Taking? Authorizing Provider  acetaminophen (TYLENOL) 500 MG tablet Take 500 mg by mouth every 6 (six) hours as needed for headache.   Yes [provider]  amLODipine (NORVASC) 10 MG tablet Take 10 mg by mouth every morning.  07/06/20  Yes [provider]  atorvastatin (LIPITOR) 80 MG tablet Take 80 mg by mouth every evening. 07/06/20  Yes [provider]  hydrALAZINE (APRESOLINE) 25 MG tablet Take 25 mg by mouth 2 (two) times daily. 07/06/20  Yes [provider]  quinapril (ACCUPRIL) 40 MG tablet Take 40 mg by mouth every morning.  10/09/12  Yes [provider]     Family History  Problem Relation Age of Onset  . Heart attack Mother   . Heart attack Father   . Hypertension Father     Social History   Socioeconomic History  . Marital status: Married    Spouse name: Not on file  . Number of children: 2  . Years of education: Not on file  . Highest education level: Not on file  Occupational History  . Not on file  Tobacco Use  . Smoking status: Former Smoker    Packs/day: 1.00    Years: 52.00    Pack years: 52.00    Types: Cigarettes    Quit date: 2021    Years since quitting: 0.7  . Smokeless tobacco: Never Used  . Tobacco comment: stooped in 2003 started again and stopped 2021  Vaping Use  .  Vaping Use: Never used  Substance and Sexual Activity  . Alcohol use: Not Currently    Comment: social drinker  . Drug use: Never  . Sexual activity: Not on file  Other Topics Concern  . Not on file  Social History Narrative  . Not on file   Social Determinants of Health   Financial Resource Strain:   . Difficulty of Paying Living Expenses: Not on file  Food Insecurity:   . Worried About Charity fundraiser in the Last Year: Not on file  . Ran Out of Food in the Last Year: Not on file  Transportation Needs:   . Lack of  Transportation (Medical): Not on file  . Lack of Transportation (Non-Medical): Not on file  Physical Activity:   . Days of Exercise per Week: Not on file  . Minutes of Exercise per Session: Not on file  Stress:   . Feeling of Stress : Not on file  Social Connections:   . Frequency of Communication with Friends and Family: Not on file  . Frequency of Social Gatherings with Friends and Family: Not on file  . Attends Religious Services: Not on file  . Active Member of Clubs or Organizations: Not on file  . Attends Archivist Meetings: Not on file  . Marital Status: Not on file      Review of Systems currently denies fever, chest pain, abdominal pain, nausea, vomiting or bleeding.  Does have occasional headaches, some dyspnea with exertion, occasional cough, and back pain  Vital Signs: BP (!) 100/58 (BP Location: Right Arm)   Pulse 61   Temp 98.6 F (37 C) (Oral)   Resp 18   SpO2 96%   Physical Exam awake, alert.  Chest clear to auscultation bilaterally.  Heart with regular rate and rhythm.  Abdomen soft, positive bowel sounds, nontender.  No lower extremity edema.  Bandage over neck fistula, stoma/trach intact.  Imaging: CT ABDOMEN WO CONTRAST  Result Date: 09/28/2020 CLINICAL DATA:  Evaluate gastric anatomy prior to potential percutaneous gastrostomy tube placement. EXAM: CT ABDOMEN WITHOUT CONTRAST TECHNIQUE: Multidetector CT imaging of the abdomen was performed following the standard protocol without IV contrast. COMPARISON:  None. FINDINGS: The lack of intravenous contrast limits the ability to evaluate solid abdominal organs. Lower chest: Limited visualization of the lower thorax demonstrates minimal bibasilar subsegmental atelectasis. No discrete focal airspace opacities. No pleural effusion. Normal heart size. Coronary artery calcifications. No pericardial effusion. Hepatobiliary: Normal hepatic contour. Normal noncontrast appearance of the gallbladder given degree of  distention. No radiopaque gallstones. No ascites. Pancreas: Normal noncontrast appearance of the pancreas. Spleen: Normal noncontrast appearance of the spleen. Adrenals/Urinary Tract: Calcifications about the bilateral renal hila are favored to be vascular in etiology. No definite renal stones. There is a minimal amount of grossly symmetric likely age and body habitus related bilateral perinephric stranding. No urinary obstruction. Normal noncontrast appearance the bilateral adrenal glands. The urinary bladder was not imaged. Stomach/Bowel: The ventral wall of the distal body of the stomach/gastric antrum is well apposed against the anterior wall of the upper abdomen without interposition of the hepatic parenchyma or transverse colon. Note is made of several slightly interposed loops of small bowel though the percutaneous window is likely to be improved with gastric insufflation. Large colonic stool burden without evidence of enteric obstruction. No pneumoperitoneum, pneumatosis or portal venous gas. Vascular/Lymphatic: Bilobed infrarenal abdominal aortic aneurysm with cranial component measuring 3.6 x 3.6 cm as measured in greatest oblique short axis axial (  image 30, series 3) and coronal (image 71, series 6) dimensions respectively, and caudal component measuring approximately 3.2 x 3.6 cm (axial image 37, series 3; coronal image 72, series 6). No bulky retroperitoneal or mesenteric lymphadenopathy. Other: There is a minimal amount of subcutaneous edema about the midline of the low back. Musculoskeletal: No acute or aggressive osseous abnormalities. 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 IMPRESSION: 1. Gastric anatomy amenable to attempted percutaneous gastrostomy tube placement as indicated with percutaneous window likely be improved with gastric insufflation. 2. Bilobed infrarenal abdominal aortic aneurysm, with dominant cranial component measuring 3.6 cm in diameter, incompletely evaluated on this  noncontrast examination. Further evaluation with nonemergent contrast enhance CTA of the abdomen pelvis could be performed as indicated. Aortic aneurysm NOS (ICD10-I71.9). 3. Coronary artery calcifications. Aortic Atherosclerosis (ICD10-I70.0). Electronically Signed   By: Sandi Mariscal M.D.   On: 09/28/2020 11:32   X-ray abdomen AP  Result Date: 09/15/2020 CLINICAL DATA:  Check feeding catheter placement EXAM: ABDOMEN - 1 VIEW COMPARISON:  None. FINDINGS: Weighted feeding catheter is noted within the stomach. It lies only a few cm beyond the gastroesophageal junction. This could be advanced several cm as clinically necessary. IMPRESSION: Weighted feeding catheter as described. Electronically Signed   By: Inez Catalina M.D.   On: 09/15/2020 02:05   DG Chest Portable 1 View  Result Date: 09/14/2020 CLINICAL DATA:  Respiratory distress EXAM: PORTABLE CHEST 1 VIEW COMPARISON:  08/05/2020 FINDINGS: Single frontal view of the chest demonstrates an unremarkable cardiac silhouette. Background emphysema again noted without focal airspace disease, effusion, or pneumothorax. No acute bony abnormalities. IMPRESSION: 1. Emphysema.  No acute process. Electronically Signed   By: Randa Ngo M.D.   On: 09/14/2020 19:03   PCV ECHOCARDIOGRAM COMPLETE  Result Date: 09/14/2020 Echocardiogram 09/13/2020: Small left ventricle cavity. Normal wall thickness. Normal global wall motion. Normal LV systolic function with EF 59%. Doppler evidence of grade I (impaired) diastolic dysfunction, normal LAP. Aortic valve not well visualized. Mild aortic stenosis. Vmax 2.3 m/sec, mean PG 13 mmHg, AVA 1.5 cm2. Normal right atrial pressure.    Labs:  CBC: Recent Labs    09/14/20 1845 09/14/20 1924 09/15/20 1015 09/18/20 0136  WBC 23.2*  --  17.1* 14.1*  HGB 11.6* 11.6* 10.4* 10.3*  HCT 35.6* 34.0* 31.7* 31.4*  PLT 383  --  274 276    COAGS: No results for input(s): INR, APTT in the last 8760 hours.  BMP: Recent Labs     08/11/20 0645 08/11/20 0645 09/14/20 1845 09/14/20 1924 09/15/20 1015 09/18/20 0136  NA 138   < > 130* 134* 135 135  K 3.2*   < > 4.3 4.3 4.6 4.5  CL 103   < > 96* 101 100 106  CO2 25  --  22  --  25 23  GLUCOSE 117*   < > 274* 283* 162* 116*  BUN 10   < > 25* 25* 15 14  CALCIUM 8.7*  --  8.9  --  8.5* 8.2*  CREATININE 0.94   < > 0.75 0.50* 0.65 0.64  GFRNONAA >60  --  >60  --  >60 >60  GFRAA >60  --  >60  --  >60 >60   < > = values in this interval not displayed.    LIVER FUNCTION TESTS: Recent Labs    09/14/20 1845  BILITOT 0.7  AST 38  ALT 70*  ALKPHOS 121  PROT 7.1  ALBUMIN 2.9*    TUMOR MARKERS:  No results for input(s): AFPTM, CEA, CA199, CHROMGRNA in the last 8760 hours.  Assessment and Plan: 74 y.o. 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  He is status post total laryngectomy on 09/14/2020.  He has had some recent bleeding from the stoma as well as leaking of swallowed material out of the neck incision consistent with pharyngocutaneous fistula.  Request now received for percutaneous gastrostomy tube placement to assist with nutrition in the interim until fistula has resolved/surgically corrected.  Imaging studies have been reviewed by Dr. Pascal Lux.  Details/risks of gastrostomy tube placement, including but not limited to, internal bleeding, infection, injury to adjacent structures, need for prolonged placement discussed with patient with his understanding and consent.  Procedure scheduled for today.  Thank you for this interesting consult.  I greatly enjoyed meeting Asberry Lascola and look forward to participating in their care.  A copy of this report was sent to the requesting provider on this date.  Electronically Signed: D. Rowe Robert, PA-C 09/28/2020, 11:54 AM   I spent a total of  30 minutes   in  face to face in clinical consultation, greater than 50% of which was counseling/coordinating care for percutaneous gastrostomy tube placement

## 2020-09-29 LAB — CBC WITH DIFFERENTIAL/PLATELET
Abs Immature Granulocytes: 0.04 10*3/uL (ref 0.00–0.07)
Basophils Absolute: 0 10*3/uL (ref 0.0–0.1)
Basophils Relative: 0 %
Eosinophils Absolute: 0.1 10*3/uL (ref 0.0–0.5)
Eosinophils Relative: 1 %
HCT: 27.1 % — ABNORMAL LOW (ref 39.0–52.0)
Hemoglobin: 8.8 g/dL — ABNORMAL LOW (ref 13.0–17.0)
Immature Granulocytes: 0 %
Lymphocytes Relative: 12 %
Lymphs Abs: 1.3 10*3/uL (ref 0.7–4.0)
MCH: 30.4 pg (ref 26.0–34.0)
MCHC: 32.5 g/dL (ref 30.0–36.0)
MCV: 93.8 fL (ref 80.0–100.0)
Monocytes Absolute: 1 10*3/uL (ref 0.1–1.0)
Monocytes Relative: 10 %
Neutro Abs: 8.2 10*3/uL — ABNORMAL HIGH (ref 1.7–7.7)
Neutrophils Relative %: 77 %
Platelets: 222 10*3/uL (ref 150–400)
RBC: 2.89 MIL/uL — ABNORMAL LOW (ref 4.22–5.81)
RDW: 13.5 % (ref 11.5–15.5)
WBC: 10.6 10*3/uL — ABNORMAL HIGH (ref 4.0–10.5)
nRBC: 0 % (ref 0.0–0.2)

## 2020-09-29 LAB — COMPREHENSIVE METABOLIC PANEL
ALT: 35 U/L (ref 0–44)
AST: 19 U/L (ref 15–41)
Albumin: 2.1 g/dL — ABNORMAL LOW (ref 3.5–5.0)
Alkaline Phosphatase: 81 U/L (ref 38–126)
Anion gap: 7 (ref 5–15)
BUN: 9 mg/dL (ref 8–23)
CO2: 20 mmol/L — ABNORMAL LOW (ref 22–32)
Calcium: 8 mg/dL — ABNORMAL LOW (ref 8.9–10.3)
Chloride: 106 mmol/L (ref 98–111)
Creatinine, Ser: 0.69 mg/dL (ref 0.61–1.24)
GFR calc non Af Amer: >60 mL/min (ref >60)
Glucose, Bld: 157 mg/dL — ABNORMAL HIGH (ref 70–99)
Potassium: 4.4 mmol/L (ref 3.5–5.1)
Sodium: 133 mmol/L — ABNORMAL LOW (ref 135–145)
Total Bilirubin: 0.5 mg/dL (ref 0.3–1.2)
Total Protein: 5.8 g/dL — ABNORMAL LOW (ref 6.5–8.1)

## 2020-09-29 LAB — PROTIME-INR
INR: 1.2 (ref 0.8–1.2)
Prothrombin Time: 15.1 seconds (ref 11.4–15.2)

## 2020-09-29 NOTE — TOC Initial Note (Addendum)
Transition of Care [facility]) - Initial/Assessment Note    Patient Details  Name: Marcus Collier MRN: [id number] Date of Birth: 19-Mar-1946  Transition of Care [facility]) CM/SW Contact:    Marilu Favre, RN Phone Number: 09/29/2020, 12:10 PM  Clinical Narrative:                  Patient from home with wife and [facility] with [facility].   Cory with [facility] aware of admission and following patient. Will need resumption of care orders.  Patient recent discharge with suction and laryngectomy supplies. Will continue to follow for discharge needs, will need home tube feeding orders.   Zach with [facility] following for tube feeds at home.   Patient and wife will need to be taught tube feeds and G Tube care by hospital RN prior to discharge. Expected Discharge Plan: [place] Barriers to Discharge: Continued Medical Work up   Patient Goals and CMS Choice Patient states their goals for this hospitalization and ongoing recovery are:: to return to home CMS Medicare.gov Compare Post Acute Care list provided to:: Patient Choice offered to / list presented to : Patient  Expected Discharge Plan and Services Expected Discharge Plan: [place]   Discharge Planning Services: CM Consult Post Acute Care Choice: [place] arrangements for the past 2 months: Single Family Home                           HH Arranged: RN [place] Agency: [place] Date [place]: 09/29/20 Time HH Agency Contacted: [id number] Representative spoke with at [place]: Tommi Rumps  Prior Living Arrangements/Services Living arrangements for the past 2 months: [place] Lives with:: Spouse Patient language and need for interpreter reviewed:: Yes Do you feel safe going back to the place where you live?: Yes      Need for Family Participation in Patient Care: Yes (Comment) Care giver support system in place?: Yes (comment) Current home services:  DME Criminal Activity/Legal Involvement Pertinent to Current Situation/Hospitalization: No - Comment as needed  Activities of Daily Living      Permission Sought/Granted   Permission granted to share information with : No              Emotional Assessment              Admission diagnosis:  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 Protein-calorie malnutrition, severe 09/16/2020  . S/P laryngectomy 09/14/2020  . Pre-op evaluation 09/13/2020  . Coronary artery disease involving native coronary artery of native heart without angina pectoris 09/13/2020  . 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 Lesion of vocal cord 11/26/2012  . 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 [place] [address] Phone: [phone] Fax: [phone]     Social Determinants of Health (SDOH) Interventions    Readmission Risk Interventions No flowsheet data found.

## 2020-09-29 NOTE — Progress Notes (Addendum)
Referring Physician(s): Bates,D  Supervising Physician: Suttle,D  Patient Status:  [facility] - In-pt  Chief Complaint: pharyngocutaneous fistula   Subjective: Pt sore at G tube site as expected; otherwise stable   Allergies: Penicillins  Medications: Prior to Admission medications   Medication Sig Start Date End Date Taking? Authorizing Provider  acetaminophen (TYLENOL) 500 MG tablet Take 500 mg by mouth every 6 (six) hours as needed for headache.   Yes [provider]  amLODipine (NORVASC) 10 MG tablet Take 10 mg by mouth every morning.  07/06/20  Yes [provider]  atorvastatin (LIPITOR) 80 MG tablet Take 80 mg by mouth every evening. 07/06/20  Yes [provider]  hydrALAZINE (APRESOLINE) 25 MG tablet Take 25 mg by mouth 2 (two) times daily. 07/06/20  Yes [provider]  quinapril (ACCUPRIL) 40 MG tablet Take 40 mg by mouth every morning.  10/09/12  Yes [provider]     Vital Signs: BP 125/61 (BP Location: Right Arm)   Pulse 70   Temp 98 F (36.7 C) (Oral)   Resp 20   SpO2 94%   Physical Exam awake/alert; G tube intact, insertion site ok, mild- mod tender, no leaking at site, abd soft,ND  Imaging: CT ABDOMEN WO CONTRAST  Result Date: 09/28/2020 CLINICAL DATA:  Evaluate gastric anatomy prior to potential percutaneous gastrostomy tube placement. EXAM: CT ABDOMEN WITHOUT CONTRAST TECHNIQUE: Multidetector CT imaging of the abdomen was performed following the standard protocol without IV contrast. COMPARISON:  None. FINDINGS: The lack of intravenous contrast limits the ability to evaluate solid abdominal organs. Lower chest: Limited visualization of the lower thorax demonstrates minimal bibasilar subsegmental atelectasis. No discrete focal airspace opacities. No pleural effusion. Normal heart size. Coronary artery calcifications. No pericardial effusion. Hepatobiliary: Normal hepatic contour. Normal noncontrast appearance of the  gallbladder given degree of distention. No radiopaque gallstones. No ascites. Pancreas: Normal noncontrast appearance of the pancreas. Spleen: Normal noncontrast appearance of the spleen. Adrenals/Urinary Tract: Calcifications about the bilateral renal hila are favored to be vascular in etiology. No definite renal stones. There is a minimal amount of grossly symmetric likely age and body habitus related bilateral perinephric stranding. No urinary obstruction. Normal noncontrast appearance the bilateral adrenal glands. The urinary bladder was not imaged. Stomach/Bowel: The ventral wall of the distal body of the stomach/gastric antrum is well apposed against the anterior wall of the upper abdomen without interposition of the hepatic parenchyma or transverse colon. Note is made of several slightly interposed loops of small bowel though the percutaneous window is likely to be improved with gastric insufflation. Large colonic stool burden without evidence of enteric obstruction. No pneumoperitoneum, pneumatosis or portal venous gas. Vascular/Lymphatic: Bilobed infrarenal abdominal aortic aneurysm with cranial component measuring 3.6 x 3.6 cm as measured in greatest oblique short axis axial (image 30, series 3) and coronal (image 71, series 6) dimensions respectively, and caudal component measuring approximately 3.2 x 3.6 cm (axial image 37, series 3; coronal image 72, series 6). No bulky retroperitoneal or mesenteric lymphadenopathy. Other: There is a minimal amount of subcutaneous edema about the midline of the low back. Musculoskeletal: No acute or aggressive osseous abnormalities. 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 IMPRESSION: 1. Gastric anatomy amenable to attempted percutaneous gastrostomy tube placement as indicated with percutaneous window likely be improved with gastric insufflation. 2. Bilobed infrarenal abdominal aortic aneurysm, with dominant cranial component measuring 3.6 cm in diameter,  incompletely evaluated on this noncontrast examination. Further evaluation with nonemergent contrast enhance CTA  of the abdomen pelvis could be performed as indicated. Aortic aneurysm NOS (ICD10-I71.9). 3. Coronary artery calcifications. Aortic Atherosclerosis (ICD10-I70.0). Electronically Signed   By: Sandi Mariscal M.D.   On: 09/28/2020 11:32   IR GASTROSTOMY TUBE MOD SED  Result Date: 09/28/2020 INDICATION: History of head neck cancer, now with dysphagia. Please perform percutaneous gastrostomy tube placement for enteric nutrition supplementation purposes. EXAM: PULL TROUGH GASTROSTOMY TUBE PLACEMENT COMPARISON:  Abdominal CT-earlier same day MEDICATIONS: Vancomycin 1 gm IV; Antibiotics were administered within 1 hour of the procedure. Glucagon 1 mg IV CONTRAST:  20 mL of Omnipaque 300 administered into the gastric lumen. ANESTHESIA/SEDATION: Moderate (conscious) sedation was employed during this procedure. A total of Versed 3 mg was administered intravenously. Moderate Sedation Time: 35 minutes. The patient's level of consciousness and vital signs were monitored continuously by radiology nursing throughout the procedure under my direct supervision. FLUOROSCOPY TIME:  4 minutes, 48 seconds (37 mGy) COMPLICATIONS: None immediate. PROCEDURE: Informed written consent was obtained from the patient following explanation of the procedure, risks, benefits and alternatives. A time out was performed prior to the initiation of the procedure. Ultrasound scanning was performed to demarcate the edge of the left lobe of the liver. Maximal barrier sterile technique utilized including caps, mask, sterile gowns, sterile gloves, large sterile drape, hand hygiene and Betadine prep. The left upper quadrant was sterilely prepped and draped. An oral gastric catheter was inserted into the stomach under fluoroscopy. The existing nasogastric feeding tube was removed. The left costal margin and air opacified transverse colon were  identified and avoided. Air was injected into the stomach for insufflation and visualization under fluoroscopy. Under sterile conditions a 17 gauge trocar needle was utilized to access the stomach percutaneously beneath the left subcostal margin after the overlying soft tissues were anesthetized with 1% Lidocaine with epinephrine. Needle position was confirmed within the stomach with aspiration of air and injection of small amount of contrast. A single T tack was deployed for gastropexy. Over an Amplatz guide wire, a 9-French sheath was inserted into the stomach. A snare device was utilized to capture the oral gastric catheter. The snare device was pulled retrograde from the stomach up the esophagus and out the oropharynx. The 20-French pull-through gastrostomy was connected to the snare device and pulled antegrade through the oropharynx down the esophagus into the stomach and then through the percutaneous tract external to the patient. The gastrostomy was assembled externally. Contrast injection confirms position in the stomach. Several spot radiographic images were obtained in various obliquities for documentation. The patient tolerated procedure well without immediate post procedural complication. FINDINGS: After successful fluoroscopic guided placement, the gastrostomy tube is appropriately positioned with internal disc against the ventral aspect of the gastric lumen. IMPRESSION: Successful fluoroscopic insertion of a 20-French pull-through gastrostomy tube. The gastrostomy may be used immediately for medication administration and in 24 hrs for the initiation of feeds. Electronically Signed   By: Sandi Mariscal M.D.   On: 09/28/2020 15:40    Labs:  CBC: Recent Labs    09/14/20 1845 09/14/20 1845 09/14/20 1924 09/15/20 1015 09/18/20 0136 09/29/20 0343  WBC 23.2*  --   --  17.1* 14.1* 10.6*  HGB 11.6*   < > 11.6* 10.4* 10.3* 8.8*  HCT 35.6*   < > 34.0* 31.7* 31.4* 27.1*  PLT 383  --   --  274 276 222    < > = values in this interval not displayed.    COAGS: Recent Labs    09/29/20  0343  INR 1.2    BMP: Recent Labs    08/11/20 0645 08/11/20 0645 09/14/20 1845 09/14/20 1845 09/14/20 1924 09/15/20 1015 09/18/20 0136 09/29/20 0343  NA 138   < > 130*   < > 134* 135 135 133*  K 3.2*   < > 4.3   < > 4.3 4.6 4.5 4.4  CL 103   < > 96*   < > 101 100 106 106  CO2 25   < > 22  --   --  25 23 20*  GLUCOSE 117*   < > 274*   < > 283* 162* 116* 157*  BUN 10   < > 25*   < > 25* 15 14 9   CALCIUM 8.7*   < > 8.9  --   --  8.5* 8.2* 8.0*  CREATININE 0.94   < > 0.75   < > 0.50* 0.65 0.64 0.69  GFRNONAA >60   < > >60  --   --  >60 >60 >60  GFRAA >60  --  >60  --   --  >60 >60  --    < > = values in this interval not displayed.    LIVER FUNCTION TESTS: Recent Labs    09/14/20 1845 09/29/20 0343  BILITOT 0.7 0.5  AST 38 19  ALT 70* 35  ALKPHOS 121 81  PROT 7.1 5.8*  ALBUMIN 2.9* 2.1*    Assessment and Plan: Pt with hx laryngeal cancer, prior laryngectomy/radiation; now with pharyngocutaneous fistula; s/p perc G tube 10/5; afebrile; WBC 10.6(14.1), hgb 8.8(10.3), creat nl; monitor labs; ok to use G tube; other plans as per primary team   Electronically Signed: D. Rowe Robert, PA-C 09/29/2020, 9:17 AM   I spent a total of 15 minutes at the the patient's bedside AND on the patient's hospital floor or unit, greater than 50% of which was counseling/coordinating care for gastrostomy tube    Patient ID: Marcus Collier, male   DOB: November 27, 1946, 74 y.o.   MRN: [id number]

## 2020-09-29 NOTE — Progress Notes (Signed)
Subjective: He is doing fairly well, not much abdominal pain.  Objective: Vital signs in last 24 hours: Temp:  [98 F (36.7 C)-98.7 F (37.1 C)] 98 F (36.7 C) (10/06 0432) Pulse Rate:  [57-81] 70 (10/06 0811) Resp:  [15-31] 20 (10/06 0811) BP: (114-149)/(50-65) 125/61 (10/06 0432) SpO2:  [94 %-100 %] 94 % (10/06 0432) FiO2 (%):  [21 %] 21 % (10/06 0811) Wt Readings from Last 1 Encounters:  09/24/20 67.8 kg    Intake/Output from previous day: 10/05 0701 - 10/06 0700 In: -  Out: 1450 [Urine:1450] Intake/Output this shift: No intake/output data recorded.  General appearance: alert, cooperative and no distress Neck: changed neck dressing to better block saliva drainage into stoma, stoma intact  Recent Labs    09/29/20 0343  WBC 10.6*  HGB 8.8*  HCT 27.1*  PLT 222    Recent Labs    09/29/20 0343  NA 133*  K 4.4  CL 106  CO2 20*  GLUCOSE 157*  BUN 9  CREATININE 0.69  CALCIUM 8.0*    Medications: I have reviewed the patient's current medications.  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  Continue with dressing on neck to try to limit saliva drainage into stoma.   LOS: 2 days   Melida Quitter 09/29/2020, 12:31 PM

## 2020-09-30 MED ORDER — AMLODIPINE BESYLATE 10 MG PO TABS
10.0000 mg | ORAL_TABLET | Freq: Every day | ORAL | Status: DC
  Administered 2020-09-30 – 2020-10-01 (×2): 10 mg
  Filled 2020-09-30 (×3): qty 1

## 2020-09-30 MED ORDER — ATORVASTATIN CALCIUM 80 MG PO TABS
80.0000 mg | ORAL_TABLET | Freq: Every day | ORAL | Status: DC
  Administered 2020-09-30 – 2020-10-02 (×3): 80 mg
  Filled 2020-09-30 (×3): qty 1

## 2020-09-30 MED ORDER — PROSOURCE TF PO LIQD
45.0000 mL | Freq: Two times a day (BID) | ORAL | Status: DC
  Filled 2020-09-30: qty 45

## 2020-09-30 MED ORDER — PROSOURCE TF PO LIQD
45.0000 mL | Freq: Three times a day (TID) | ORAL | Status: DC
  Filled 2020-09-30: qty 45

## 2020-09-30 MED ORDER — OSMOLITE 1.5 CAL PO LIQD
1000.0000 mL | ORAL | Status: DC
  Administered 2020-09-30 (×3): 1000 mL
  Filled 2020-09-30: qty 1000

## 2020-09-30 MED ORDER — LISINOPRIL 40 MG PO TABS
40.0000 mg | ORAL_TABLET | Freq: Every day | ORAL | Status: DC
  Administered 2020-09-30 – 2020-10-01 (×2): 40 mg
  Filled 2020-09-30 (×3): qty 1

## 2020-09-30 MED ORDER — FREE WATER
100.0000 mL | Freq: Four times a day (QID) | Status: DC
  Administered 2020-09-30 – 2020-10-01 (×6): 100 mL

## 2020-09-30 MED ORDER — OSMOLITE 1.5 CAL PO LIQD: 1000.0000 mL | ORAL | Status: DC

## 2020-09-30 MED ORDER — HYDRALAZINE HCL 25 MG PO TABS
25.0000 mg | ORAL_TABLET | Freq: Two times a day (BID) | ORAL | Status: DC
  Administered 2020-09-30 – 2020-10-01 (×3): 25 mg
  Filled 2020-09-30 (×4): qty 1

## 2020-09-30 NOTE — Care Management Important Message (Signed)
Important Message  Patient Details  Name: Marcus Collier MRN: [id number] Date of Birth: [birth year]   Medicare Important Message Given:  Yes     [name] 09/30/2020, 12:46 PM

## 2020-09-30 NOTE — Progress Notes (Signed)
Initial Nutrition Assessment  DOCUMENTATION CODES:   Not applicable  INTERVENTION:   Tube Feeding via G-tube at present: Osmolite 1.5 at 20, goal rate of 60 ml/hr Provides 2160 kcals, 90 g of protein and 1094 mL of free water   Bolus Feeding Recommendations:  Goal TF regimen is 6 cans of Osmolite 1.5 administered via bolus modality  Provide either 1.5 cans (360 mL) 4 times daily OR 2 cans (480 mL) 2 times daily to provide   Regimen provides 2130 kcals, 90 g of protein and 1086 mL of free water  Add free water flushes 250 mL 4 times daily   NUTRITION DIAGNOSIS:   Inadequate oral intake related to cancer and cancer related treatments as evidenced by NPO status.  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 Plastic Surgery and ENT consulted to assist with closure.  PMH includes HTN   10/5 G tube placed  Pt alert, unable to speak.   Currently initiated on continuous feedings; plan for bolus feedings at home. Possible D/C on Saturday to home with flap surgery next Friday  Osmolite 1.5 at 20 ml/hr via G-tube, tolerating thus far, some abd pain  Pt carries severe malnutrition diagnosis from admission 2 weeks ago and likely remains malnourished at this time; unable to perform NFPE or obtain much information from patient at this time.   Labs: sodium 133 (L), Creatinine wdl Meds: D%-1/2 NS with KCl at 110 ml/hr   Diet Order:   Diet Order            Diet NPO time specified  Diet effective now                 EDUCATION NEEDS:   Education needs have been addressed  Skin:  Skin Integrity Issues:: Incisions, Other (Comment) Incisions:  abdomen-feeding tube site Other: pharyngeocutaneous fistula  Last BM:  10/3  Height:   Ht Readings from Last 1 Encounters:  09/13/20 5' 8.5" (1.74 m)    Weight:   Wt Readings from Last 1 Encounters:  09/24/20 67.8 kg    BMI:  There is no height or weight on file to calculate BMI.  Estimated Nutritional Needs:   Kcal:  2000-2300 kcals  Protein:  100-130 g  Fluid:  >/= 2 L   Marcus Passey MS, RDN, LDN, CNSC Registered Dietitian III Clinical Nutrition RD Pager and On-Call Pager Number Located in [place]

## 2020-09-30 NOTE — Progress Notes (Signed)
Subjective: Some abdominal pain.  Dressing holding back saliva well.  Objective: Vital signs in last 24 hours: Temp:  [98.6 F (37 C)-99.2 F (37.3 C)] 98.6 F (37 C) (10/07 0440) Pulse Rate:  [59-69] 64 (10/07 0440) Resp:  [18-20] 20 (10/07 0440) BP: (116-153)/(60-66) 116/65 (10/07 0440) SpO2:  [94 %-97 %] 94 % (10/07 0440) FiO2 (%):  [21 %] 21 % (10/07 0434) Wt Readings from Last 1 Encounters:  09/24/20 67.8 kg    Intake/Output from previous day: 10/06 0701 - 10/07 0700 In: 4943.9 [I.V.:4933.9] Out: 1800 [Urine:1800] Intake/Output this shift: No intake/output data recorded.  General appearance: alert, cooperative and no distress Neck: stoma intact with Lary tube in place, neck dressing in place  Recent Labs    09/29/20 0343  WBC 10.6*  HGB 8.8*  HCT 27.1*  PLT 222    Recent Labs    09/29/20 0343  NA 133*  K 4.4  CL 106  CO2 20*  GLUCOSE 157*  BUN 9  CREATININE 0.69  CALCIUM 8.0*    Medications: I have reviewed the patient's current medications.  Assessment/Plan: Pharyngocutaneous fistula s/p laryngectomy  Doing well.  Initiate tube feeds.  Plan to change to bolus feeds possibly tomorrow.  Possible discharge home Saturday with flap surgery next Friday.   LOS: 3 days   Marcus Collier 09/30/2020, 8:14 AM

## 2020-10-01 ENCOUNTER — Encounter (HOSPITAL_COMMUNITY): Payer: Self-pay | Admitting: Otolaryngology

## 2020-10-01 ENCOUNTER — Other Ambulatory Visit: Payer: Self-pay

## 2020-10-01 MED ORDER — ENSURE ENLIVE PO LIQD
237.0000 mL | Freq: Two times a day (BID) | ORAL | Status: DC
  Administered 2020-10-01: 237 mL via ORAL

## 2020-10-01 MED ORDER — ENSURE ENLIVE PO LIQD
237.0000 mL | Freq: Two times a day (BID) | ORAL | Status: DC
  Administered 2020-10-02: 237 mL

## 2020-10-01 MED ORDER — FREE WATER
250.0000 mL | Freq: Four times a day (QID) | Status: DC
  Administered 2020-10-01 – 2020-10-02 (×4): 250 mL

## 2020-10-01 MED ORDER — OSMOLITE 1.5 CAL PO LIQD
480.0000 mL | Freq: Three times a day (TID) | ORAL | Status: DC
  Administered 2020-10-01: 480 mL
  Administered 2020-10-02: 474 mL
  Administered 2020-10-02: 480 mL

## 2020-10-01 NOTE — Progress Notes (Signed)
Spoke with Diane today, Mr. Leibold spouse, to ensure they had enough HMEs for an approximate of a 30 day supply. She confirmed that they do. She also confirmed that they have received the "Coming Home Kit" from [place] which has at least a 30 day supply of HMEs.  They have a combination of the day time HMEs and the night-time HMEs. I explained that the day and night time HMEs can be used interchangeably and she voiced understanding.  Diane is bringing a small supply of HMEs to the hospital for Mr. Isa to use while he is an inpatient. I confirmed with her that at home he is using one HME/day, which should be the same while he is an inpatient.

## 2020-10-01 NOTE — Progress Notes (Signed)
Patient assisted with administration of bolus feeding/flush.  Patient required no vernal cues and demonstrated correct technique.

## 2020-10-01 NOTE — Progress Notes (Addendum)
RT in to check pt for routine care. Pt resting comfortably in bed, RA, no obvious distress noted. RT will check back at later time.

## 2020-10-01 NOTE — Progress Notes (Signed)
Teaching of medication and bolus feed/flush began with patient and spouse.  Wife will be unavailable at next scheduled bolus time, but patient feels comfortable in learning care individually.

## 2020-10-01 NOTE — Progress Notes (Signed)
Subjective: Tolerating tube feeds well.  No complaints.  Objective: Vital signs in last 24 hours: Temp:  [98.7 F (37.1 C)-99.2 F (37.3 C)] 98.7 F (37.1 C) (10/08 0553) Pulse Rate:  [69-87] 78 (10/08 0553) Resp:  [16-20] 18 (10/08 0846) BP: (112-148)/(50-80) 113/53 (10/08 0553) SpO2:  [91 %-93 %] 91 % (10/08 0553) Wt Readings from Last 1 Encounters:  09/24/20 67.8 kg    Intake/Output from previous day: 10/07 0701 - 10/08 0700 In: 0  Out: 1125 [Urine:1125] Intake/Output this shift: No intake/output data recorded.  General appearance: alert, cooperative and no distress Neck: fistula stable, stoma intact with Lary tube in place, dressing reapplied  Recent Labs    09/29/20 0343  WBC 10.6*  HGB 8.8*  HCT 27.1*  PLT 222    Recent Labs    09/29/20 0343  NA 133*  K 4.4  CL 106  CO2 20*  GLUCOSE 157*  BUN 9  CREATININE 0.69  CALCIUM 8.0*    Medications: I have reviewed the patient's current medications.  Assessment/Plan: Pharyngocutaneous fistula s/p laryngectomy  Change to bolus feeds today.  Fistula stable.  Flap reconstruction planned for next Friday.   LOS: 4 days   Melida Quitter 10/01/2020, 12:08 PM

## 2020-10-01 NOTE — Progress Notes (Signed)
Wife administered Ensure supplement via Peg Tube with RN supervision. Minimal cueing/instruction given. Spouse verbalized and demonstrated correct technique.  Spouse should be able to administer bolus feeds similarly without difficulty.

## 2020-10-02 MED ORDER — HYDROCODONE-ACETAMINOPHEN 7.5-325 MG/15ML PO SOLN: 15.0000 mL | Freq: Four times a day (QID) | ORAL | 0 refills | Status: DC | PRN

## 2020-10-02 NOTE — TOC Transition Note (Signed)
Transition of Care [facility]) - CM/SW Discharge Note   Patient Details  Name: Saige Busby MRN: [id number] Date of Birth: 08/26/1946  Transition of Care [facility]) CM/SW Contact:  Sharin Mons, RN Phone Number: 10/02/2020, 1:30 PM   Clinical Narrative:    Patient will DC to: home Anticipated DC date: 10/02/2020 Family notified: wife Transport by: care  Per MD patient ready for DC today. RN, patient, and patient's wife and home health agency, [facility]ings. NCM f/u with enteral TF referral with [employer]. [employer] to delivery nutrition to pt home by 3-7pm today per [employer] /[place].    RNCM will sign off for now as intervention is no longer needed.    Final next level of care: [place] Barriers to Discharge: No Barriers Identified   Patient Goals and CMS Choice Patient states their goals for this hospitalization and ongoing recovery are:: to return to home CMS Medicare.gov Compare Post Acute Care list provided to:: Patient Choice offered to / list presented to : Patient  Discharge Placement                       Discharge Plan and Services   Discharge Planning Services: CM Consult Post Acute Care Choice: Home Health                    HH Arranged: RN [place]: [place] Date [place]: 09/29/20 Time [place]: 1209 Representative spoke with at [place]: [place] ([place]) Interventions     Readmission Risk Interventions No flowsheet data found.

## 2020-10-02 NOTE — Progress Notes (Signed)
Discharge instructions reviewed at bedside with patient and spouse.  Both verbalized understanding and declined further education. Patient transported off unit via wheelchair and staff supervision.

## 2020-10-02 NOTE — TOC Progression Note (Signed)
Transition of Care [facility]) - Progression Note    Patient Details  Name: Marcus Collier MRN: [id number] Date of Birth: 1946/01/31  Transition of Care [facility]) CM/SW Contact  Sharin Mons, [place] Phone Number: [phone] 10/02/2020, 11:48 AM  Clinical Narrative:    Per MD pt medically ready for d/c. Pt is to d/c to home with tube feeding. Awaiting [employer] to complete  DME home ENTERAL FEEDING order. Once order processed pt can d/c to home with wife.   TOC team will continue to monitor....  Expected Discharge Plan: [place] Services Barriers to Discharge: Other (comment)  Expected Discharge Plan and Services Expected Discharge Plan: [place]   Discharge Planning Services: CM Consult Post Acute Care Choice: [place] arrangements for the past 2 months: Single Family Home Expected Discharge Date: 10/02/20                         HH Arranged: RN [place] Agency: [place] Date [place]: 09/29/20 Time [place]: 1209 Representative spoke with at [place]: [place] ([place]) Interventions    Readmission Risk Interventions No flowsheet data found.

## 2020-10-02 NOTE — Discharge Summary (Signed)
Physician Discharge Summary  Patient ID: Marcus Collier MRN: [id number] DOB/AGE: 76-May-1947 76 y.o.  Admit date: 09/27/2020 Discharge date: 10/02/2020  Admission Diagnoses: Pharyngocutaneous fistula, laryngeal cancer s/p laryngectomy  Discharge Diagnoses:  Active Problems:   Pharyngocutaneous fistula   Discharged Condition: good  Hospital Course: 74 year old male underwent laryngectomy over two weeks ago for laryngeal cancer that had failed radiation treatments.  He was managed post-operatively with a feeding tube in place for about 10 days and was then allowed to swallow and was discharged.  Two days after discharge, he began leaking saliva and swallowed contents out of the neck wound and was admitted back to the hospital with a pharyngocutaneous fistula.  He has undergone G-tube placement and the fistula has been manageable with dressing changes.  Now on bolus feeds and with good instruction, he is comfortable with discharge from the hospital to receive home tube feeds until his planned flap reconstruction surgery on 10/15.  Consults: None  Significant Diagnostic Studies: None  Treatments: surgery: G-tube placemen  Discharge Exam: Blood pressure (!) 111/57, pulse 65, temperature 98.1 F (36.7 C), resp. rate 18, height 5' 8.5" (1.74 m), weight 68.3 kg, SpO2 92 %. General appearance: alert, cooperative and no distress Neck: dressing changed, midline fistula stable with saliva leaking, stoma intact with Lary tube in place  Disposition: Discharge disposition: 01-Home or Self Care       Discharge Instructions    Diet - low sodium heart healthy   Complete by: As directed    Discharge instructions   Complete by: As directed    Change gauze dressing to neck wound three times per day or as needed to hold saliva out of airway.  Continue use of Lary tube and HMEs as before.  Administer tube feeds 2 cans of Osmolite 1.5 three times per day plus 250 ml of water four times per day.   Resume home medications through the G-tube.   Discharge wound care:   Complete by: As directed    Change gauze dressing over neck wound three times per day and as needed.   Increase activity slowly   Complete by: As directed      Allergies as of 10/02/2020      Reactions   Penicillins Other (See Comments)   Unknown reaction/ Childhood      Medication List    TAKE these medications   acetaminophen 500 MG tablet Commonly known as: TYLENOL Take 500 mg by mouth every 6 (six) hours as needed for headache.   amLODipine 10 MG tablet Commonly known as: NORVASC Take 10 mg by mouth every morning.   atorvastatin 80 MG tablet Commonly known as: LIPITOR Take 80 mg by mouth every evening.   hydrALAZINE 25 MG tablet Commonly known as: APRESOLINE Take 25 mg by mouth 2 (two) times daily.   HYDROcodone-acetaminophen 7.5-325 mg/15 ml solution Commonly known as: HYCET Place 15 mLs into feeding tube every 6 (six) hours as needed for moderate pain.   quinapril 40 MG tablet Commonly known as: ACCUPRIL Take 40 mg by mouth every morning.            Discharge Care Instructions  (From admission, onward)         Start     Ordered   10/02/20 0000  Discharge wound care:       Comments: Change gauze dressing over neck wound three times per day and as needed.   10/02/20 9449  Follow-up Information    [facility], [facility] Follow up.   Specialty: Home Health Services Contact information: [place] [address] [phone]        Izora Gala, MD. Call.   Specialty: Otolaryngology Why: With any questions.  Our office will call you about plans for surgery on 10/15. Contact information: [address] [place] [address] [phone]               Signed: Melida Quitter 10/02/2020, 9:48 AM

## 2020-10-02 NOTE — Progress Notes (Signed)
Pt states no respiratory needs at this time. Advised pt to notify if any further assistance required.

## 2020-10-05 ENCOUNTER — Other Ambulatory Visit (HOSPITAL_COMMUNITY)
Admission: RE | Admit: 2020-10-05 | Discharge: 2020-10-05 | Disposition: A | Discharge status: Home / Self Care | Payer: Medicare Other | Source: Ambulatory Visit | Attending: Otolaryngology | Admitting: Otolaryngology

## 2020-10-05 DIAGNOSIS — Z01812 Encounter for preprocedural laboratory examination: Secondary | ICD-10-CM | POA: Insufficient documentation

## 2020-10-05 DIAGNOSIS — Z20822 Contact with and (suspected) exposure to covid-19: Secondary | ICD-10-CM | POA: Insufficient documentation

## 2020-10-05 LAB — SARS CORONAVIRUS 2 (TAT 6-24 HRS): SARS Coronavirus 2: NEGATIVE

## 2020-10-06 NOTE — H&P (Signed)
Marcus Collier is an 74 y.o. male.   Chief Complaint: Pharyngocutaneous fistula. HPI: History of laryngectomy last month, post radiation salvage surgery. Started oral liquids on postop day 10 and return to the hospital several days later with leakage from the incision. The stoma is intact.  Past Medical History:  Diagnosis Date  . Acute myocardial infarction ([place])   . Aortic stenosis    mild AS 09/13/20 echo [facility])  . 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 Family history of adverse reaction to anesthesia   . Headache    migraine  . Hoarseness of voice   . Hypertension   . 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 S/P radiation therapy 12/10/2012 - 01/20/2013   Vocal Cords/ 63 Gray/ 28 Fractions  . Unspecified malignant neoplasm of skin, site unspecified     Past Surgical History:  Procedure Laterality Date  . Biopsy of Vocal cord  11/15/12   Right and Left  . Biospy mouth  11/15/12  . CARDIAC CATHETERIZATION     with stents   . CORONARY ANGIOPLASTY    . DIRECT LARYNGOSCOPY N/A 08/11/2020   Procedure: DIRECT LARYNGOSCOPY WITH BIOPSY;  Surgeon: Izora Gala, MD;  Location: [place];  Service: ENT;  Laterality: N/A;  . GASTROSTOMY TUBE PLACEMENT  09/28/2020  . IR GASTROSTOMY TUBE MOD SED  09/28/2020  . LARYNGETOMY N/A 09/14/2020   Procedure: LARYNGECTOMY Total;  Surgeon: Izora Gala, MD;  [facility];  Service: ENT;  Laterality: N/A;  NEEDS RNFA    Family History  Problem Relation Age of Onset  . Heart attack Mother   . Heart attack Father   . Hypertension Father    Social History:  reports that he quit smoking about 9 months ago. His smoking use included cigarettes. He has a 52.00 pack-year smoking history. He has never used smokeless tobacco. He reports previous alcohol use. He reports that he does not use drugs.  Allergies:  Allergies  Allergen Reactions  . Penicillins Other (See Comments)    Unknown reaction/ Childhood    No  medications prior to admission.    Results for orders placed or performed during the hospital encounter of 10/05/20 (from the past 48 hour(s))  SARS CORONAVIRUS 2 (TAT 6-24 HRS) Nasopharyngeal Nasopharyngeal Swab     Status: None   Collection Time: 10/05/20 12:49 PM   Specimen: Nasopharyngeal Swab  Result Value Ref Range   SARS Coronavirus 2 NEGATIVE NEGATIVE    Comment: (NOTE) SARS-CoV-2 target nucleic acids are NOT DETECTED.  The SARS-CoV-2 RNA is generally detectable in upper and lower respiratory specimens during the acute phase of infection. Negative results do not preclude SARS-CoV-2 infection, do not rule out co-infections with other pathogens, and should not be used as the sole basis for treatment or other patient management decisions. Negative results must be combined with clinical observations, patient history, and epidemiological information. The expected result is Negative.  Fact Sheet for Patients: SugarRoll.be  Fact Sheet for Healthcare Providers: https://www.woods-mathews.com/  This test is not yet approved or cleared by the [place] FDA and  has been authorized for detection and/or diagnosis of SARS-CoV-2 by FDA under an Emergency Use Authorization (EUA). This EUA will remain  in effect (meaning this test can be used) for the duration of the COVID-19 declaration under Se ction 564(b)(1) of the Act, 21 U.S.C. section 360bbb-3(b)(1), unless the authorization is terminated or revoked sooner.  Performed at [facility], [address] [place], [place] [address]    No results found.  ROS: otherwise negative  There were no vitals taken for this visit.  PHYSICAL EXAM: Overall appearance:  Healthy appearing, in no distress Head:  Normocephalic, atraumatic. Ears: External ears are healthy. Nose: External nose is healthy in appearance. Internal nasal exam free of any lesions or obstruction. Oral  Cavity/pharynx:  There are no mucosal lesions or masses identified. Neuro:  No identifiable neurologic deficits. Neck: No palpable neck masses. Stoma is clear and patent. There is salivary drainage from the anterior cervical incision with packing in place.  Studies Reviewed: none    Assessment/Plan Post radiation, post total laryngectomy, pharyngocutaneous fistula. Recommend surgical debridement and repair with pectoralis major myocutaneous flap in conjunction with Dr. Claudia Desanctis of plastic surgery.  Izora Gala 10/06/2020, 12:15 PM

## 2020-10-07 ENCOUNTER — Encounter (HOSPITAL_COMMUNITY): Payer: Self-pay | Admitting: Otolaryngology

## 2020-10-07 ENCOUNTER — Other Ambulatory Visit: Payer: Self-pay

## 2020-10-07 NOTE — Progress Notes (Signed)
Mr. Marcus Collier is unable to speak due to Laryngectomy.  Mrs. Marcus Collier took the Pre- Surgery call. Mrs. Marcus Collier reports that when given medication that patient get approximately 4 ounces of water- with medications mixed in water and G-tube flush. I asked what time patient would be getting medications, Mrs. Marcus Collier responded 0900.  Mrs. Marcus Collier said that he did not take am medications before surgery last time due to the amount of water he would need and "he did fine."

## 2020-10-08 ENCOUNTER — Inpatient Hospital Stay (HOSPITAL_COMMUNITY)
Admission: RE | Admit: 2020-10-08 | Discharge: 2020-10-13 | DRG: 908 | Disposition: A | Discharge status: Home / Self Care | Payer: Medicare Other | Attending: Otolaryngology | Admitting: Otolaryngology

## 2020-10-08 ENCOUNTER — Inpatient Hospital Stay (HOSPITAL_COMMUNITY): Payer: Medicare Other | Admitting: Certified Registered Nurse Anesthetist

## 2020-10-08 ENCOUNTER — Other Ambulatory Visit: Payer: Self-pay

## 2020-10-08 ENCOUNTER — Encounter (HOSPITAL_COMMUNITY): Payer: Self-pay | Admitting: Otolaryngology

## 2020-10-08 ENCOUNTER — Encounter (HOSPITAL_COMMUNITY)
Admission: RE | Disposition: A | Discharge status: Home / Self Care | Payer: Self-pay | Source: Home / Self Care | Attending: Otolaryngology

## 2020-10-08 DIAGNOSIS — Z9002 Acquired absence of larynx: Secondary | ICD-10-CM

## 2020-10-08 DIAGNOSIS — Y929 Unspecified place or not applicable: Secondary | ICD-10-CM | POA: Diagnosis not present

## 2020-10-08 DIAGNOSIS — X58XXXA Exposure to other specified factors, initial encounter: Secondary | ICD-10-CM | POA: Diagnosis present

## 2020-10-08 DIAGNOSIS — I252 Old myocardial infarction: Secondary | ICD-10-CM | POA: Diagnosis not present

## 2020-10-08 DIAGNOSIS — Z87891 Personal history of nicotine dependence: Secondary | ICD-10-CM

## 2020-10-08 DIAGNOSIS — Z8249 Family history of ischemic heart disease and other diseases of the circulatory system: Secondary | ICD-10-CM

## 2020-10-08 DIAGNOSIS — Z9861 Coronary angioplasty status: Secondary | ICD-10-CM

## 2020-10-08 DIAGNOSIS — J392 Other diseases of pharynx: Secondary | ICD-10-CM

## 2020-10-08 DIAGNOSIS — Z85828 Personal history of other malignant neoplasm of skin: Secondary | ICD-10-CM

## 2020-10-08 DIAGNOSIS — Z880 Allergy status to penicillin: Secondary | ICD-10-CM | POA: Diagnosis not present

## 2020-10-08 DIAGNOSIS — Z20822 Contact with and (suspected) exposure to covid-19: Secondary | ICD-10-CM | POA: Diagnosis present

## 2020-10-08 DIAGNOSIS — Z923 Personal history of irradiation: Secondary | ICD-10-CM

## 2020-10-08 DIAGNOSIS — Z930 Tracheostomy status: Secondary | ICD-10-CM

## 2020-10-08 DIAGNOSIS — T8189XA Other complications of procedures, not elsewhere classified, initial encounter: Secondary | ICD-10-CM | POA: Diagnosis present

## 2020-10-08 DIAGNOSIS — Z8521 Personal history of malignant neoplasm of larynx: Secondary | ICD-10-CM

## 2020-10-08 DIAGNOSIS — I10 Essential (primary) hypertension: Secondary | ICD-10-CM | POA: Diagnosis present

## 2020-10-08 DIAGNOSIS — S1120XA Unspecified open wound of pharynx and cervical esophagus, initial encounter: Secondary | ICD-10-CM | POA: Diagnosis present

## 2020-10-08 HISTORY — PX: PECTORALIS FLAP: SHX6228

## 2020-10-08 HISTORY — PX: CLOSURE ORAL ANTRAL FISTULA: SHX6286

## 2020-10-08 HISTORY — PX: SKIN SPLIT GRAFT: SHX444

## 2020-10-08 LAB — GLUCOSE, CAPILLARY: Glucose-Capillary: 293 mg/dL — ABNORMAL HIGH (ref 70–99)

## 2020-10-08 SURGERY — CLOSURE, FISTULA, OROANTRAL
Anesthesia: General | Site: Neck

## 2020-10-08 MED ORDER — INDOCYANINE GREEN 25 MG IV SOLR
INTRAVENOUS | Status: DC | PRN
  Administered 2020-10-08: 5 mg via INTRAVENOUS

## 2020-10-08 MED ORDER — ONDANSETRON HCL 4 MG/2ML IJ SOLN: 4.0000 mg | Freq: Once | INTRAMUSCULAR | Status: DC | PRN

## 2020-10-08 MED ORDER — OSMOLITE 1.2 CAL PO LIQD
360.0000 mL | Freq: Four times a day (QID) | ORAL | Status: DC
  Administered 2020-10-08: 360 mL
  Filled 2020-10-08 (×2): qty 474

## 2020-10-08 MED ORDER — FENTANYL CITRATE (PF) 250 MCG/5ML IJ SOLN
INTRAMUSCULAR | Status: AC
  Filled 2020-10-08: qty 5

## 2020-10-08 MED ORDER — OSMOLITE 1.2 CAL PO LIQD
360.0000 mL | Freq: Four times a day (QID) | ORAL | Status: DC
  Administered 2020-10-09: 360 mL
  Filled 2020-10-08 (×2): qty 474

## 2020-10-08 MED ORDER — SODIUM BICARBONATE 4 % IV SOLN
Freq: Once | INTRAVENOUS | Status: AC
  Administered 2020-10-08: 300 mL via INTRAMUSCULAR
  Filled 2020-10-08: qty 50

## 2020-10-08 MED ORDER — CHLORHEXIDINE GLUCONATE 0.12 % MT SOLN
OROMUCOSAL | Status: AC
  Administered 2020-10-08: 15 mL via OROMUCOSAL
  Filled 2020-10-08: qty 15

## 2020-10-08 MED ORDER — FREE WATER
250.0000 mL | Freq: Four times a day (QID) | Status: DC
  Administered 2020-10-09 – 2020-10-13 (×19): 250 mL

## 2020-10-08 MED ORDER — HYDROMORPHONE HCL 1 MG/ML IJ SOLN
0.2500 mg | INTRAMUSCULAR | Status: DC | PRN
  Administered 2020-10-08: 0.5 mg via INTRAVENOUS

## 2020-10-08 MED ORDER — DEXTROSE-NACL 5-0.9 % IV SOLN: INTRAVENOUS | Status: DC

## 2020-10-08 MED ORDER — PHENYLEPHRINE HCL-NACL 10-0.9 MG/250ML-% IV SOLN
INTRAVENOUS | Status: DC | PRN
  Administered 2020-10-08: 20 ug/min via INTRAVENOUS

## 2020-10-08 MED ORDER — DEXAMETHASONE SODIUM PHOSPHATE 10 MG/ML IJ SOLN
INTRAMUSCULAR | Status: DC | PRN
  Administered 2020-10-08: 5 mg via INTRAVENOUS

## 2020-10-08 MED ORDER — HYDRALAZINE HCL 25 MG PO TABS
25.0000 mg | ORAL_TABLET | Freq: Two times a day (BID) | ORAL | Status: DC
  Administered 2020-10-08 – 2020-10-13 (×10): 25 mg
  Filled 2020-10-08 (×11): qty 1

## 2020-10-08 MED ORDER — STERILE WATER FOR IRRIGATION IR SOLN
Status: DC | PRN
  Administered 2020-10-08: 1000 mL

## 2020-10-08 MED ORDER — LIDOCAINE 2% (20 MG/ML) 5 ML SYRINGE
INTRAMUSCULAR | Status: AC
  Filled 2020-10-08: qty 5

## 2020-10-08 MED ORDER — ORAL CARE MOUTH RINSE: 15.0000 mL | Freq: Once | OROMUCOSAL | Status: AC

## 2020-10-08 MED ORDER — MINERAL OIL LIGHT 100 % EX OIL
TOPICAL_OIL | CUTANEOUS | Status: DC | PRN
  Administered 2020-10-08: 1 via TOPICAL

## 2020-10-08 MED ORDER — PHENYLEPHRINE 40 MCG/ML (10ML) SYRINGE FOR IV PUSH (FOR BLOOD PRESSURE SUPPORT)
PREFILLED_SYRINGE | INTRAVENOUS | Status: DC | PRN
  Administered 2020-10-08: 80 ug via INTRAVENOUS

## 2020-10-08 MED ORDER — AMLODIPINE BESYLATE 10 MG PO TABS
10.0000 mg | ORAL_TABLET | Freq: Every morning | ORAL | Status: DC
  Administered 2020-10-09 – 2020-10-13 (×5): 10 mg
  Filled 2020-10-08 (×5): qty 1

## 2020-10-08 MED ORDER — ENSURE SURGERY PO LIQD
237.0000 mL | Freq: Three times a day (TID) | ORAL | Status: DC
  Filled 2020-10-08: qty 237

## 2020-10-08 MED ORDER — MORPHINE SULFATE (PF) 2 MG/ML IV SOLN
2.0000 mg | INTRAVENOUS | Status: DC | PRN
  Administered 2020-10-10 – 2020-10-12 (×4): 2 mg via INTRAVENOUS
  Filled 2020-10-08 (×4): qty 1

## 2020-10-08 MED ORDER — MEPERIDINE HCL 25 MG/ML IJ SOLN: 6.2500 mg | INTRAMUSCULAR | Status: DC | PRN

## 2020-10-08 MED ORDER — 0.9 % SODIUM CHLORIDE (POUR BTL) OPTIME
TOPICAL | Status: DC | PRN
  Administered 2020-10-08: 1000 mL

## 2020-10-08 MED ORDER — DEXAMETHASONE SODIUM PHOSPHATE 10 MG/ML IJ SOLN
INTRAMUSCULAR | Status: AC
  Filled 2020-10-08: qty 1

## 2020-10-08 MED ORDER — SODIUM CHLORIDE (PF) 0.9 % IJ SOLN
INTRAMUSCULAR | Status: AC
  Filled 2020-10-08: qty 20

## 2020-10-08 MED ORDER — EPHEDRINE 5 MG/ML INJ
INTRAVENOUS | Status: AC
  Filled 2020-10-08: qty 10

## 2020-10-08 MED ORDER — ENSURE SURGERY PO LIQD
237.0000 mL | Freq: Two times a day (BID) | ORAL | Status: DC
  Administered 2020-10-08 – 2020-10-09 (×2): 237 mL
  Filled 2020-10-08 (×3): qty 237

## 2020-10-08 MED ORDER — ROCURONIUM BROMIDE 10 MG/ML (PF) SYRINGE
PREFILLED_SYRINGE | INTRAVENOUS | Status: DC | PRN
  Administered 2020-10-08: 40 mg via INTRAVENOUS
  Administered 2020-10-08: 60 mg via INTRAVENOUS

## 2020-10-08 MED ORDER — CLINDAMYCIN PHOSPHATE 600 MG/50ML IV SOLN
600.0000 mg | Freq: Three times a day (TID) | INTRAVENOUS | Status: AC
  Administered 2020-10-08 – 2020-10-09 (×3): 600 mg via INTRAVENOUS
  Filled 2020-10-08 (×3): qty 50

## 2020-10-08 MED ORDER — ONDANSETRON HCL 4 MG/2ML IJ SOLN
INTRAMUSCULAR | Status: AC
  Filled 2020-10-08: qty 4

## 2020-10-08 MED ORDER — FENTANYL CITRATE (PF) 250 MCG/5ML IJ SOLN
INTRAMUSCULAR | Status: DC | PRN
Start: 2020-10-08 — End: 2020-10-08
  Administered 2020-10-08: 50 ug via INTRAVENOUS
  Administered 2020-10-08: 100 ug via INTRAVENOUS
  Administered 2020-10-08 (×4): 50 ug via INTRAVENOUS

## 2020-10-08 MED ORDER — ROCURONIUM BROMIDE 10 MG/ML (PF) SYRINGE
PREFILLED_SYRINGE | INTRAVENOUS | Status: AC
  Filled 2020-10-08: qty 10

## 2020-10-08 MED ORDER — HYDROCODONE-ACETAMINOPHEN 7.5-325 MG/15ML PO SOLN
15.0000 mL | Freq: Four times a day (QID) | ORAL | Status: DC | PRN
  Administered 2020-10-09 – 2020-10-13 (×9): 15 mL
  Filled 2020-10-08 (×10): qty 15

## 2020-10-08 MED ORDER — LISINOPRIL 40 MG PO TABS
40.0000 mg | ORAL_TABLET | Freq: Every day | ORAL | Status: DC
  Administered 2020-10-09: 40 mg via ORAL
  Filled 2020-10-08: qty 1

## 2020-10-08 MED ORDER — LIDOCAINE 2% (20 MG/ML) 5 ML SYRINGE
INTRAMUSCULAR | Status: DC | PRN
  Administered 2020-10-08: 60 mg via INTRAVENOUS

## 2020-10-08 MED ORDER — PROPOFOL 10 MG/ML IV BOLUS
INTRAVENOUS | Status: DC | PRN
  Administered 2020-10-08: 200 mg via INTRAVENOUS

## 2020-10-08 MED ORDER — QUINAPRIL HCL 10 MG PO TABS: 40.0000 mg | ORAL_TABLET | Freq: Every morning | ORAL | Status: DC

## 2020-10-08 MED ORDER — HYDROMORPHONE HCL 1 MG/ML IJ SOLN
INTRAMUSCULAR | Status: AC
  Administered 2020-10-08: 0.5 mg via INTRAVENOUS
  Filled 2020-10-08: qty 1

## 2020-10-08 MED ORDER — CLINDAMYCIN PHOSPHATE 900 MG/50ML IV SOLN
900.0000 mg | INTRAVENOUS | Status: AC
  Administered 2020-10-08: 900 mg via INTRAVENOUS
  Filled 2020-10-08: qty 50

## 2020-10-08 MED ORDER — PHENYLEPHRINE 40 MCG/ML (10ML) SYRINGE FOR IV PUSH (FOR BLOOD PRESSURE SUPPORT)
PREFILLED_SYRINGE | INTRAVENOUS | Status: AC
  Filled 2020-10-08: qty 20

## 2020-10-08 MED ORDER — IBUPROFEN 200 MG PO TABS
200.0000 mg | ORAL_TABLET | Freq: Four times a day (QID) | ORAL | Status: DC | PRN
  Administered 2020-10-12 – 2020-10-13 (×2): 200 mg
  Filled 2020-10-08 (×2): qty 1

## 2020-10-08 MED ORDER — PROMETHAZINE HCL 25 MG PO TABS: 25.0000 mg | ORAL_TABLET | Freq: Four times a day (QID) | ORAL | Status: DC | PRN

## 2020-10-08 MED ORDER — LACTATED RINGERS IV SOLN: INTRAVENOUS | Status: DC

## 2020-10-08 MED ORDER — FENTANYL CITRATE (PF) 100 MCG/2ML IJ SOLN: 25.0000 ug | INTRAMUSCULAR | Status: DC | PRN

## 2020-10-08 MED ORDER — STERILE WATER FOR INJECTION IJ SOLN
INTRAMUSCULAR | Status: AC
  Filled 2020-10-08: qty 10

## 2020-10-08 MED ORDER — ONDANSETRON HCL 4 MG/2ML IJ SOLN
INTRAMUSCULAR | Status: DC | PRN
  Administered 2020-10-08: 4 mg via INTRAVENOUS

## 2020-10-08 MED ORDER — PROMETHAZINE HCL 25 MG RE SUPP
25.0000 mg | Freq: Four times a day (QID) | RECTAL | Status: DC | PRN
  Filled 2020-10-08: qty 1

## 2020-10-08 MED ORDER — ATORVASTATIN CALCIUM 80 MG PO TABS
80.0000 mg | ORAL_TABLET | Freq: Every evening | ORAL | Status: DC
  Administered 2020-10-09 – 2020-10-12 (×4): 80 mg
  Filled 2020-10-08 (×4): qty 1

## 2020-10-08 MED ORDER — PROPOFOL 10 MG/ML IV BOLUS
INTRAVENOUS | Status: AC
  Filled 2020-10-08: qty 20

## 2020-10-08 MED ORDER — SUGAMMADEX SODIUM 200 MG/2ML IV SOLN
INTRAVENOUS | Status: DC | PRN
  Administered 2020-10-08: 200 mg via INTRAVENOUS

## 2020-10-08 MED ORDER — CHLORHEXIDINE GLUCONATE 0.12 % MT SOLN: 15.0000 mL | Freq: Once | OROMUCOSAL | Status: AC

## 2020-10-08 SURGICAL SUPPLY — 64 items
BAG DECANTER FOR FLEXI CONT (MISCELLANEOUS) ×6 IMPLANT
BENZOIN TINCTURE PRP APPL 2/3 (GAUZE/BANDAGES/DRESSINGS) ×2 IMPLANT
BIOPATCH RED 1 DISK 7.0 (GAUZE/BANDAGES/DRESSINGS) ×1 IMPLANT
BIOPATCH RED 1IN DISK 7.0MM (GAUZE/BANDAGES/DRESSINGS) ×1
BLADE DERMATOME SS (BLADE) ×2 IMPLANT
BNDG ELASTIC 6X5.8 VLCR STR LF (GAUZE/BANDAGES/DRESSINGS) ×2 IMPLANT
BRUSH SCRUB EZ PLAIN DRY (MISCELLANEOUS) ×2 IMPLANT
CANISTER SUCT 3000ML PPV (MISCELLANEOUS) ×4 IMPLANT
CLEANER TIP ELECTROSURG 2X2 (MISCELLANEOUS) ×4 IMPLANT
CLOSURE WOUND 1/2 X4 (GAUZE/BANDAGES/DRESSINGS) ×1
CNTNR URN SCR LID CUP LEK RST (MISCELLANEOUS) IMPLANT
CONT SPEC 4OZ STRL OR WHT (MISCELLANEOUS) ×2
CORD BIPOLAR FORCEPS 12FT (ELECTRODE) ×4 IMPLANT
COVER SURGICAL LIGHT HANDLE (MISCELLANEOUS) ×4 IMPLANT
DERMACARRIERS GRAFT 1 TO 1.5 (DISPOSABLE) ×8
DRAPE HALF SHEET 40X57 (DRAPES) ×2 IMPLANT
DRAPE INCISE IOBAN 66X45 STRL (DRAPES) ×4 IMPLANT
DRAPE ORTHO SPLIT 77X108 STRL (DRAPES) ×8
DRAPE SURG ORHT 6 SPLT 77X108 (DRAPES) ×8 IMPLANT
DRSG CALCIUM ALGINATE 4X4 (GAUZE/BANDAGES/DRESSINGS) ×2 IMPLANT
DRSG TEGADERM 4X4.75 (GAUZE/BANDAGES/DRESSINGS) ×2 IMPLANT
ELECT COATED BLADE 2.86 ST (ELECTRODE) ×4 IMPLANT
ELECT REM PT RETURN 9FT ADLT (ELECTROSURGICAL) ×4
ELECTRODE REM PT RTRN 9FT ADLT (ELECTROSURGICAL) IMPLANT
FORCEPS BIPOLAR SPETZLER 8 1.0 (NEUROSURGERY SUPPLIES) ×4 IMPLANT
GAUZE SPONGE 4X4 12PLY STRL (GAUZE/BANDAGES/DRESSINGS) ×2 IMPLANT
GAUZE XEROFORM 5X9 LF (GAUZE/BANDAGES/DRESSINGS) ×4 IMPLANT
GLOVE BIOGEL M STRL SZ7.5 (GLOVE) ×6 IMPLANT
GLOVE ECLIPSE 7.5 STRL STRAW (GLOVE) ×4 IMPLANT
GLOVE INDICATOR 8.0 STRL GRN (GLOVE) ×8 IMPLANT
GOWN STRL REUS W/ TWL LRG LVL3 (GOWN DISPOSABLE) ×8 IMPLANT
GOWN STRL REUS W/TWL LRG LVL3 (GOWN DISPOSABLE) ×8
GRAFT DERMACARRIERS 1 TO 1.5 (DISPOSABLE) ×2 IMPLANT
KIT BASIN OR (CUSTOM PROCEDURE TRAY) ×4 IMPLANT
KIT TURNOVER KIT B (KITS) ×8 IMPLANT
MARKER SKIN DUAL TIP RULER LAB (MISCELLANEOUS) ×2 IMPLANT
NDL 18GX1X1/2 (RX/OR ONLY) (NEEDLE) IMPLANT
NDL SPNL 18GX3.5 QUINCKE PK (NEEDLE) ×4 IMPLANT
NEEDLE 18GX1X1/2 (RX/OR ONLY) (NEEDLE) ×8 IMPLANT
NEEDLE SPNL 18GX3.5 QUINCKE PK (NEEDLE) ×4 IMPLANT
NS IRRIG 1000ML POUR BTL (IV SOLUTION) ×8 IMPLANT
PACK GENERAL/GYN (CUSTOM PROCEDURE TRAY) ×4 IMPLANT
PACK SPY-PHI (KITS) ×2 IMPLANT
PAD ABD 8X10 STRL (GAUZE/BANDAGES/DRESSINGS) ×2 IMPLANT
PAD ARMBOARD 7.5X6 YLW CONV (MISCELLANEOUS) ×8 IMPLANT
PATTIES SURGICAL .5 X3 (DISPOSABLE) ×4 IMPLANT
PENCIL FOOT CONTROL (ELECTRODE) ×4 IMPLANT
STAPLER INSORB 30 2030 C-SECTI (MISCELLANEOUS) ×2 IMPLANT
STAPLER VISISTAT 35W (STAPLE) ×4 IMPLANT
STRIP CLOSURE SKIN 1/2X4 (GAUZE/BANDAGES/DRESSINGS) ×1 IMPLANT
SUT ETHILON 3 0 FSL (SUTURE) ×6 IMPLANT
SUT SILK 0 FSL (SUTURE) ×2 IMPLANT
SUT SILK 3 0 (SUTURE) ×2
SUT SILK 3-0 18XBRD TIE 12 (SUTURE) IMPLANT
SUT VIC AB 4-0 PS2 18 (SUTURE) ×4 IMPLANT
SUT VIC AB 4-0 SH 18 (SUTURE) ×6 IMPLANT
SUT VLOC 90 P-14 23 (SUTURE) ×2 IMPLANT
SYR 50ML LL SCALE MARK (SYRINGE) ×8 IMPLANT
SYR CONTROL 10ML LL (SYRINGE) ×2 IMPLANT
TOWEL GREEN STERILE FF (TOWEL DISPOSABLE) ×4 IMPLANT
TRAY ENT MC OR (CUSTOM PROCEDURE TRAY) ×4 IMPLANT
TUBE CONNECTING 12'X1/4 (SUCTIONS) ×1
TUBE CONNECTING 12X1/4 (SUCTIONS) ×3 IMPLANT
WATER STERILE IRR 1000ML POUR (IV SOLUTION) ×4 IMPLANT

## 2020-10-08 NOTE — Progress Notes (Signed)
Patient placed on trach collar on room air. Pt is resting well at this time.

## 2020-10-08 NOTE — Op Note (Signed)
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  Closure of pharyngocutaneous fistula using pectoralis major myocutaneous flap 2.  Split-thickness skin graft to the pectoralis major totaling 10 x 5 cm 3.  Indocyanine green angiography to assess flap perfusion  SURGEON: Talmadge Coventry, MD  ASSISTANT: Izora Gala, MD  Dr. Constance Holster assisted throughout the case.  He was essential in retraction and counter traction when needed to make the case progress smoothly.  This retraction and assistance made it possible to see the tissue planes for the procedure.  The assistance was needed for blood control, tissue re-approximation and assisted with closure of the incision site.  ANESTHESIA:  General.   COMPLICATIONS: None.   INDICATIONS FOR PROCEDURE:  The patient, Marcus Collier is a 74 y.o. male born on 05-09-1946, is here for treatment of pharyngocutaneous fistula. MRN: [id number]  CONSENT:  Informed consent was obtained directly from the patient. Risks, benefits and alternatives were fully discussed. Specific risks including but not limited to bleeding, infection, hematoma, seroma, scarring, pain, contracture, asymmetry, wound healing problems, and need for further surgery were all discussed. The patient did have an ample opportunity to have questions answered to satisfaction.   DESCRIPTION OF PROCEDURE:  The patient was taken to the operating room. SCDs were placed and antibiotics were given.  General anesthesia was administered.  The patient's operative site was prepped and draped in a sterile fashion. A time out was performed and all information was confirmed to be correct.  Dr. Constance Holster started by exploring the neck and exposing the pharyngeal defect.  This was approximately 2 x 2 cm in size.  Some  surrounding skin was debrided by him and then a tunnel was created between the neck and the pectoral area.  At this point I measured out the location of the skin paddle using the clavicle as a pivot point of the pectoralis major myocutaneous flap.  I designed a elliptical skin paddle that was approximately 9 x 3 cm that was adjacent to and medial to the nipple.  I then incised along the borders of the skin paddle with the 10 blade beveling out to try to encompasses many perforators as possible.  I then extended the incision laterally towards the shoulder to facilitate exposure.  Cautery was then used to elevate the soft tissues off the pectoralis major muscle until I were was able to clarify the borders.  I then found the lateral border and dissected underneath the pectoralis major muscle and continue this subpectoral dissection throughout.  The inferior and medial attachments were all divided in the pectoralis was reflected superiorly.  I then used the Doppler to identify the thoracoacromial pedicle and planned out a division of the back lateral to this to preserve the blood supply.  I then came through the lateral aspect of the muscle with cautery and rechecked the Doppler signal which was nicely preserved.  At this point I clinically assessed the skin paddle which had good healthy bleeding throughout.  I used indocyanine green angiography to confirm this and the entire skin paddle lead up nicely.  We then widened the tunnel between the chest surgical site in the neck to facilitate rotation of the flap.  The flap was then tunneled up without any issues and we assessed the amount of skin paddle that was required to close the pharyngeal defect.  I was able to de-epithelialized or excise the skin paddle that we did not need and then reassess the viability.  There was nice healthy appropriate bleeding during the deepithelialization after the flap had been tunneled.  Dr. Constance Holster then used Vicryl sutures to inset the skin  paddle into the pharyngeal defect.  Vicryl sutures were then used to secure the muscle which was now superficial underneath the skin and to bring the skin over top of it.  This left an approximately 10 x 5 cm area of exposed muscle.  I then turned my attention to the left thigh.  Tumescent solution was infiltrated and I then harvested a split-thickness skin graft at 14 thousandths of an inch with a dermatome.  This was then meshed 1.5-1 and inset over the exposed muscle with staples.  The donor site was dressed with calcium alginate and an [place].  I then focused my attention on the left chest wound.  I ensured meticulous hemostasis which was present and then placed a 15 French drain which was secured with a nylon suture.  The chest wound was then closed with interrupted and sorb buried staples and a running 3 OV lock.  This was covered with benzoin and Steri-Strips.  I then fashioned a bolster for the skin graft which consisted of Xeroform, scrub brush sponge, and 3-0 nylon.  The chest was then wrapped in an Ace wrap after being covered with a soft dressing taking care not to apply any pressure to the pedicle.  The patient tolerated the procedure well.  There were no complications. The patient was allowed to wake from anesthesia, extubated and taken to the recovery room in satisfactory condition.

## 2020-10-08 NOTE — Op Note (Signed)
OPERATIVE REPORT  DATE OF SURGERY: 10/08/2020  PATIENT:  Marcus Collier,  74 y.o. male  PRE-OPERATIVE DIAGNOSIS: Pharyngocutaneous fistula  POST-OPERATIVE DIAGNOSIS: pharyngocutaneous fistula  PROCEDURE:  Procedure(s): 1.  Repair of  PHARYNGOCUTANEOUS FISTULA 2.  PHARYNGEAL RECONSTRUCTION WITH PECTORALIS MAJOR MYOCUTANEOUS FLAP 3.  SKIN GRAFT SPLIT THICKNESS  SURGEON:  Beckie Salts, MD-procedure going, Dr. Claudia Desanctis, procedure 2 and 3  ASSISTANTS: Dr. Claudia Desanctis, procedure 1, Dr. Constance Holster procedure 2 and 3  ANESTHESIA:   General   EBL:  100 ml  LOCAL MEDICATIONS USED:  None  SPECIMEN: Tissue around pharyngocutaneous fistula for routine pathology  COUNTS:  Correct  PROCEDURE DETAILS: The patient was taken to the operating room and placed on the operating table in the supine position. Following induction of general endotracheal anesthesia, directly through the tracheal stoma, the neck chest and left thigh were prepped in the standard fashion.  Procedures #2 and 3 will be on a separate report by Dr. Claudia Desanctis.  The stoma was in good condition and healed nicely.  The incision was all healed except for about a 1-1/2 cm portion at the central aspect where there is an obvious draining fistula from the pharynx.  A #38 Isabell Jarvis dilator was placed through the mouth into the esophagus.  Electrocautery was used to incise the cervical incision and to extend it about 2 or 3 cm on each side and then to core out the fistulous tract down to the pharynx.  After the debridement the fistula measured approximately 5 x 3 cm.  The underlying mucosa looked healthy.  There is no obvious residual tumor.  Once the myocutaneous flap was brought up through the subcutaneous tunnel into the neck it was sized and then trimmed.  The cutaneous portion was used to reapproximate repair the pharyngeal opening using interrupted 3-0 Vicryl suture.  After the closure was accomplished an A[place]pto was used to flush saline down to the  pharynx and there was no evidence of leakage.  The dilator was removed.  The donor site was closed and the skin graft was placed.  Patient was then awakened extubated and transferred to PACU in stable condition.    PATIENT DISPOSITION:  To PACU, stable

## 2020-10-08 NOTE — Anesthesia Procedure Notes (Signed)
Procedure Name: Intubation Date/Time: 10/08/2020 12:54 PM Performed by: Dorthea Cove, CRNA Pre-anesthesia Checklist: Patient identified, Emergency Drugs available, Suction available and Patient being monitored Patient Re-evaluated:Patient Re-evaluated prior to induction Oxygen Delivery Method: Circle system utilized Preoxygenation: Pre-oxygenation with 100% oxygen Induction Type: IV induction Ventilation: Mask ventilation without difficulty Tube type: Reinforced Number of attempts: 1 Airway Equipment and Method: Tracheostomy Placement Confirmation: ETT inserted through vocal cords under direct vision,  positive ETCO2 and breath sounds checked- equal and bilateral Tube secured with: Tape Dental Injury: Teeth and Oropharynx as per pre-operative assessment  Comments: 7.0 reinforced ETT inserted through stoma. +EtCO2 and Bilateral breath sounds.

## 2020-10-08 NOTE — Interval H&P Note (Signed)
History and Physical Interval Note:  10/08/2020 11:39 AM  Marcus Collier  has presented today for surgery, with the diagnosis of fistula repair.  The various methods of treatment have been discussed with the patient and family. After consideration of risks, benefits and other options for treatment, the patient has consented to  Procedure(s): CLOSURE ORAL ANTRAL FISTULA (N/A) PHARYNGEAL RECONSTRUCTION WITH PECTORALIS MAJOR MYOCUTANEOUS FLAP (N/A) SKIN GRAFT SPLIT THICKNESS (N/A) as a surgical intervention.  The patient's history has been reviewed, patient examined, no change in status, stable for surgery.  I have reviewed the patient's chart and labs.  Questions were answered to the patient's satisfaction.     Marcus Collier

## 2020-10-08 NOTE — Progress Notes (Signed)
ENT Post Operative Note  Subjective: Patient seen and examined, wife at bedside.  Patient states he is feeling overall well.  Denies nausea, emesis.  He states pain is presently well controlled.  He denies shortness of breath, difficulty breathing.  He states that he is hungry and would like to eat.   Vitals:   10/08/20 1710 10/08/20 1805  BP: (!) 136/55 135/62  Pulse: 77 79  Resp: (!) 26 18  Temp: 98 F (36.7 C) 98.1 F (36.7 C)  SpO2: 96% 96%    OBJECTIVE  Gen: alert, cooperative, appropriate Head/ENT: EOMI, neck supple, mucus membranes moist and pink, conjunctiva clear  Neck: Bolster dressing in place, well secured.  Laryngectomy stoma inspected, widely patent with no bleeding or crusting.  Lary tube with HME filter resecured with Velcro tie.  Chest: Pectoralis muscle flap tunnel with no significant edema. Incision C/D/I. JP drain exiting with sanguinous drainage.  Respiratory: Aphonic secondary to laryngectomy, non-labored breathing on room air, no accessory muscle use, normal HR, good O2 saturations  ASSESSMENT/ PLAN Breyer Tejera is a 74 y.o. male with history of laryngectomy who is POD 0 from repair of pharyngocutaneous fistula, reconstruction with pectoralis major myocutaneous flap, split thickness skin graft with Drs. Rosen and [employer].  -Pain control -Continue strict n.p.o. status. Will resume tube feeds as per nutrition recommendation from previous admission, nutrition consulted to reevaluate, await recommendations -Frequent laryngectomy stoma care with removal of Lary tube, inspection of stoma, removal of any crusting -Please maintain HME over Lary tube at all times -No pressure over left chest -Continue JP drains to bulb suction, monitor output -Encourage ambulation to tolerance -Follow up morning labs  [place], DO

## 2020-10-08 NOTE — Progress Notes (Addendum)
Called on-call [facility] regarding patient's CBG=[phone] at [phone] and Dr. Haywood Pao replied ordered no night time coverage at this time since patient is not diabetic and ordered for A1C tomorrow morning.

## 2020-10-08 NOTE — Anesthesia Preprocedure Evaluation (Addendum)
Anesthesia Evaluation  Patient identified by MRN, date of birth, ID band Patient awake    Reviewed: Allergy & Precautions, NPO status , Patient's Chart, lab work & pertinent test results  Airway Mallampati: II  TM Distance: >3 FB Neck ROM: Full    Dental  (+) Poor Dentition, Missing   Pulmonary former smoker,  S/p trach   Pulmonary exam normal breath sounds clear to auscultation       Cardiovascular hypertension, Pt. on medications (-) angina+ CAD, + Past MI and + Cardiac Stents  Normal cardiovascular exam+ Valvular Problems/Murmurs AS  Rhythm:Regular Rate:Normal     Neuro/Psych  Headaches, negative psych ROS   GI/Hepatic negative GI ROS, Neg liver ROS,   Endo/Other  negative endocrine ROS  Renal/GU negative Renal ROS     Musculoskeletal negative musculoskeletal ROS (+)   Abdominal   Peds  Hematology  (+) Blood dyscrasia, anemia ,   Anesthesia Other Findings Day of surgery medications reviewed with the patient.  Reproductive/Obstetrics                            Anesthesia Physical Anesthesia Plan  ASA: III  Anesthesia Plan: General   Post-op Pain Management:    Induction: Intravenous  PONV Risk Score and Plan: 3 and Dexamethasone and Ondansetron  Airway Management Planned: Tracheostomy  Additional Equipment:   Intra-op Plan:   Post-operative Plan: Extubation in OR  Informed Consent: I have reviewed the patients History and Physical, chart, labs and discussed the procedure including the risks, benefits and alternatives for the proposed anesthesia with the patient or authorized representative who has indicated his/her understanding and acceptance.       Plan Discussed with: CRNA  Anesthesia Plan Comments:        Anesthesia Quick Evaluation

## 2020-10-08 NOTE — Interval H&P Note (Signed)
History and Physical Interval Note:  10/08/2020 12:03 PM  Marcus Collier  has presented today for surgery, with the diagnosis of fistula repair.  The various methods of treatment have been discussed with the patient and family. After consideration of risks, benefits and other options for treatment, the patient has consented to  Procedure(s): CLOSURE ORAL ANTRAL FISTULA (N/A) PHARYNGEAL RECONSTRUCTION WITH PECTORALIS MAJOR MYOCUTANEOUS FLAP (N/A) SKIN GRAFT SPLIT THICKNESS (N/A) as a surgical intervention.  The patient's history has been reviewed, patient examined, no change in status, stable for surgery.  I have reviewed the patient's chart and labs.  Questions were answered to the patient's satisfaction.     Cindra Presume

## 2020-10-08 NOTE — Transfer of Care (Signed)
Immediate Anesthesia Transfer of Care Note  Patient: Ronie Barnhart  Procedure(s) Performed: CLOSURE ORAL ANTRAL FISTULA (N/A Neck) PHARYNGEAL RECONSTRUCTION WITH PECTORALIS MAJOR MYOCUTANEOUS FLAP (N/A Neck) SKIN GRAFT SPLIT THICKNESS (N/A Leg Upper)  Patient Location: PACU  Anesthesia Type:General  Level of Consciousness: awake and patient cooperative  Airway & Oxygen Therapy: Patient Spontanous Breathing  Post-op Assessment: Report given to RN, Post -op Vital signs reviewed and stable and Patient moving all extremities  Post vital signs: Reviewed and stable  Last Vitals:  Vitals Value Taken Time  BP    Temp    Pulse 85 10/08/20 1605  Resp 11 10/08/20 1605  SpO2 95 % 10/08/20 1605  Vitals shown include unvalidated device data.  Last Pain:  Vitals:   10/08/20 1116  TempSrc:   PainSc: 0-No pain      Patients Stated Pain Goal: 2 (18/40/37 5436)  Complications: No complications documented.

## 2020-10-09 LAB — GLUCOSE, CAPILLARY
Glucose-Capillary: 111 mg/dL — ABNORMAL HIGH (ref 70–99)
Glucose-Capillary: 121 mg/dL — ABNORMAL HIGH (ref 70–99)
Glucose-Capillary: 149 mg/dL — ABNORMAL HIGH (ref 70–99)
Glucose-Capillary: 162 mg/dL — ABNORMAL HIGH (ref 70–99)
Glucose-Capillary: 207 mg/dL — ABNORMAL HIGH (ref 70–99)
Glucose-Capillary: 307 mg/dL — ABNORMAL HIGH (ref 70–99)
Glucose-Capillary: 353 mg/dL — ABNORMAL HIGH (ref 70–99)

## 2020-10-09 LAB — BASIC METABOLIC PANEL
Anion gap: 11 (ref 5–15)
BUN: 28 mg/dL — ABNORMAL HIGH (ref 8–23)
CO2: 22 mmol/L (ref 22–32)
Calcium: 8.3 mg/dL — ABNORMAL LOW (ref 8.9–10.3)
Chloride: 97 mmol/L — ABNORMAL LOW (ref 98–111)
Creatinine, Ser: 1.1 mg/dL (ref 0.61–1.24)
GFR, Estimated: >60 mL/min (ref >60)
Glucose, Bld: 400 mg/dL — ABNORMAL HIGH (ref 70–99)
Potassium: 5.6 mmol/L — ABNORMAL HIGH (ref 3.5–5.1)
Sodium: 130 mmol/L — ABNORMAL LOW (ref 135–145)

## 2020-10-09 LAB — CBC
HCT: 29 % — ABNORMAL LOW (ref 39.0–52.0)
Hemoglobin: 9.6 g/dL — ABNORMAL LOW (ref 13.0–17.0)
MCH: 31.1 pg (ref 26.0–34.0)
MCHC: 33.1 g/dL (ref 30.0–36.0)
MCV: 93.9 fL (ref 80.0–100.0)
Platelets: 233 10*3/uL (ref 150–400)
RBC: 3.09 MIL/uL — ABNORMAL LOW (ref 4.22–5.81)
RDW: 13.8 % (ref 11.5–15.5)
WBC: 21.2 10*3/uL — ABNORMAL HIGH (ref 4.0–10.5)
nRBC: 0 % (ref 0.0–0.2)

## 2020-10-09 LAB — TSH: TSH: 2.338 u[IU]/mL (ref 0.350–4.500)

## 2020-10-09 MED ORDER — LISINOPRIL 40 MG PO TABS
40.0000 mg | ORAL_TABLET | Freq: Every day | ORAL | Status: DC
  Administered 2020-10-10 – 2020-10-13 (×4): 40 mg
  Filled 2020-10-09 (×4): qty 1

## 2020-10-09 MED ORDER — INSULIN ASPART 100 UNIT/ML ~~LOC~~ SOLN
0.0000 [IU] | SUBCUTANEOUS | Status: DC
  Administered 2020-10-09: 2 [IU] via SUBCUTANEOUS
  Administered 2020-10-09: 11 [IU] via SUBCUTANEOUS
  Administered 2020-10-10: 2 [IU] via SUBCUTANEOUS
  Administered 2020-10-10 (×2): 3 [IU] via SUBCUTANEOUS
  Administered 2020-10-10: 2 [IU] via SUBCUTANEOUS
  Administered 2020-10-10: 3 [IU] via SUBCUTANEOUS
  Administered 2020-10-11: 2 [IU] via SUBCUTANEOUS
  Administered 2020-10-11: 8 [IU] via SUBCUTANEOUS
  Administered 2020-10-11: 5 [IU] via SUBCUTANEOUS
  Administered 2020-10-11: 3 [IU] via SUBCUTANEOUS
  Administered 2020-10-11: 2 [IU] via SUBCUTANEOUS
  Administered 2020-10-12: 5 [IU] via SUBCUTANEOUS
  Administered 2020-10-12 (×2): 3 [IU] via SUBCUTANEOUS
  Administered 2020-10-13: 5 [IU] via SUBCUTANEOUS
  Administered 2020-10-13: 2 [IU] via SUBCUTANEOUS

## 2020-10-09 MED ORDER — OSMOLITE 1.5 CAL PO LIQD
360.0000 mL | Freq: Four times a day (QID) | ORAL | Status: DC
  Administered 2020-10-09 – 2020-10-13 (×17): 360 mL
  Filled 2020-10-09 (×9): qty 474

## 2020-10-09 MED ORDER — PROSOURCE TF PO LIQD
45.0000 mL | Freq: Two times a day (BID) | ORAL | Status: DC
  Administered 2020-10-09 – 2020-10-13 (×9): 45 mL
  Filled 2020-10-09 (×11): qty 45

## 2020-10-09 MED ORDER — OSMOLITE 1.2 CAL PO LIQD
360.0000 mL | Freq: Four times a day (QID) | ORAL | Status: DC
  Filled 2020-10-09: qty 474

## 2020-10-09 NOTE — Progress Notes (Signed)
Initial Nutrition Assessment  RD working remotely.  DOCUMENTATION CODES:   Not applicable  INTERVENTION:   Bolus tube feeds via G-tube: - 360 ml (1.5 cartons) of Osmolite 1.5 cal formula QID - ProSource TF 45 ml BID - Free water flushes of 250 ml q 6 hours  Tube feeding regimen with flushes provides 2213 kcal, 111 grams of protein, and 2084 ml of H2O.   NUTRITION DIAGNOSIS:   Inadequate oral intake related to cancer and cancer related treatments as evidenced by NPO status.  GOAL:   Patient will meet greater than or equal to 90% of their needs  MONITOR:   Diet advancement, Labs, Weight trends, TF tolerance, Skin  REASON FOR ASSESSMENT:   Consult Enteral/tube feeding initiation and management, Assessment of nutrition requirement/status  ASSESSMENT:   74 year old male admitted for fistula repair. Pt with pharyngocutaneous fistula with PMH of recent total pharyngectomy, HTN. Pt with history of recurrent laryngeal cancer with radiation therapy.   10/15 - s/p repair of pharyngocutaneous fistula, pharyngeal reconstruction with pectoralis major myocutaneous flap, skin graft split thickness  RD was unable to reach pt via phone call to room. Reviewed weight history in chart. Pt with a 5.8 kg weight loss since 08/11/20. This is an 8% weight loss in less than 2 months which is significant for timeframe. Suspect pt with some degree of malnutrition but unable to confirm at this time. Pt did meet criteria for severe malnutrition during previous admission on 9/22.  Current TF: Osmolite 1.2 cal 360 ml QID, free water flushes of 250 ml q 6 hours  Above TF regimen does not meet pt's estimated needs. Will change to Osmolite 1.5 cal formula to better meet pt's needs.  Medications reviewed and include: Ensure Surgery BID per tube, SSI q 4 hours, IV abx IVF: D5 in NS @ 75 ml/hr  Labs reviewed: sodium 130, potassium 5.6, BUN 28, hemoglobin 9.6 CBG's: 121-353  UOP: 890 ml x 24 hours JP  drain: 150 ml x 24 hours I/O's: +983 x 24 hours  NUTRITION - FOCUSED PHYSICAL EXAM:  Unable to complete at this time. RD working remotely.  Diet Order:   Diet Order            Diet NPO time specified  Diet effective now                 EDUCATION NEEDS:   No education needs have been identified at this time  Skin:  Skin Assessment: Skin Integrity Issues: Incisions: left chest, left thigh, neck  Last BM:  no documented BM  Height:   Ht Readings from Last 1 Encounters:  10/08/20 5' 8.5" (1.74 m)    Weight:   Wt Readings from Last 1 Encounters:  10/09/20 66.8 kg    Ideal Body Weight:  71.4 kg  BMI:  Body mass index is 22.07 kg/m.  Estimated Nutritional Needs:   Kcal:  2000-2300  Protein:  100-130 grams  Fluid:  >/= 2.0 L    Gaynell Face, MS, RD, LDN Inpatient Clinical Dietitian Please see [facility] for contact information.

## 2020-10-09 NOTE — Progress Notes (Addendum)
  ENT PROGRESS NOTE   Subjective: Patient seen and examined at bedside. Patient was noted to be hyperglycemic yesterday after resuming tube feeds.  POC glucose this morning was 400.  Patient does not have history of diabetes mellitus.  He was otherwise doing well, he states he is voiding without difficulty.  Pain controlled with medication.  He is not yet passing flatus, but denies abdominal pain or nausea.   Objective: Vital signs in last 24 hours: Temp:  [98 F (36.7 C)-98.6 F (37 C)] 98.5 F (36.9 C) (10/16 0855) Pulse Rate:  [66-83] 76 (10/16 0855) Resp:  [15-26] 18 (10/16 0738) BP: (121-164)/(53-66) 129/56 (10/16 0855) SpO2:  [91 %-100 %] 95 % (10/16 0855) FiO2 (%):  [21 %] 21 % (10/16 0855) Weight:  [66.8 kg-68.3 kg] 66.8 kg (10/16 0500)  Gen: alert, cooperative, appropriate Head/ENT: EOMI, neck supple, mucus membranes moist and pink, conjunctiva clear  Neck: Bolster dressing in place, well secured.  Laryngectomy stoma inspected, widely patent with no obstructive crusting noted.  Lary tube removed and cleaned, resecured with Velcro tie.  Chest: Pectoralis muscle flap tunnel with no significant edema. JP drain exiting with sanguinous drainage.  Respiratory: Aphonic secondary to laryngectomy, non-labored breathing on room air, no accessory muscle use, normal HR, good O2 saturations   Recent Labs    10/09/20 0114  NA 130*  K 5.6*  CL 97*  CO2 22  GLUCOSE 400*  BUN 28*  CREATININE 1.10  CALCIUM 8.3*   Lab Results  Component Value Date   WBC 21.2 (H) 10/09/2020   HGB 9.6 (L) 10/09/2020   HCT 29.0 (L) 10/09/2020   MCV 93.9 10/09/2020   PLT 233 10/09/2020    Medications: I have reviewed the patient's current medications.  New Imaging:  No new imaging   Assessment/Plan: Marcus Collier is a 74 y.o. male with history of laryngectomy who is POD #1 from repair of pharyngocutaneous fistula, reconstruction with pectoralis major myocutaneous flap, split thickness skin  graft with Drs. Rosen and [employer].  -Continue pain control -Continue strict n.p.o. status. Continue tube feeds as per nutrition recommendation from previous admission, nutrition consulted to reevaluate, await recommendations -Patient placed on insulin sliding scale this morning due to persistently elevated blood sugars, HbA1c pending -Frequent laryngectomy stoma care with removal of Lary tube, inspection of stoma, removal of any crusting -Please maintain HME or humidified trach collar over Lary tube at all times. If HME filter in place, trach collar unnecessary  -No pressure over left chest and neck -Continue JP drains to bulb suction, monitor output -Encourage ambulation to tolerance with assist, will consult PT/OT    LOS: 1 day    Jason Coop, [facility] 10/09/2020, 9:52 AM

## 2020-10-09 NOTE — Progress Notes (Signed)
Pt looks good this morning.  Bolster dressing in place.  Chest with appropriate appearance.  No subcutaneous fluid.  SS fluid in drain.  Donor site is fine.  Continue plan of care.  Avoid pressure over left upper chest and neck.    Can reinforce thigh donor site with ABDs and wrap thigh when it starts to drain through  Continuing to follow

## 2020-10-09 NOTE — Progress Notes (Signed)
Trach care done

## 2020-10-10 ENCOUNTER — Encounter (HOSPITAL_COMMUNITY): Payer: Self-pay | Admitting: Otolaryngology

## 2020-10-10 LAB — GLUCOSE, CAPILLARY
Glucose-Capillary: 130 mg/dL — ABNORMAL HIGH (ref 70–99)
Glucose-Capillary: 135 mg/dL — ABNORMAL HIGH (ref 70–99)
Glucose-Capillary: 181 mg/dL — ABNORMAL HIGH (ref 70–99)
Glucose-Capillary: 97 mg/dL (ref 70–99)
Glucose-Capillary: 218 mg/dL — ABNORMAL HIGH (ref 70–99)

## 2020-10-10 LAB — BASIC METABOLIC PANEL
Anion gap: 8 (ref 5–15)
BUN: 15 mg/dL (ref 8–23)
CO2: 25 mmol/L (ref 22–32)
Calcium: 8.4 mg/dL — ABNORMAL LOW (ref 8.9–10.3)
Chloride: 100 mmol/L (ref 98–111)
Creatinine, Ser: 0.65 mg/dL (ref 0.61–1.24)
GFR, Estimated: >60 mL/min (ref >60)
Glucose, Bld: 145 mg/dL — ABNORMAL HIGH (ref 70–99)
Potassium: 4.9 mmol/L (ref 3.5–5.1)
Sodium: 133 mmol/L — ABNORMAL LOW (ref 135–145)

## 2020-10-10 NOTE — Progress Notes (Signed)
Trach care done, ties changed

## 2020-10-10 NOTE — Evaluation (Signed)
Physical Therapy Evaluation Patient Details Name: Marcus Collier MRN: [id number] DOB: 08-07-1946 Today's Date: 10/10/2020   History of Present Illness  Marcus Collier is a 74 y.o. male with history of laryngectomy who is s/p repair of pharyngocutaneous fistula, reconstruction with pectoralis major myocutaneous flap, split thickness skin graft with Drs. Constance Holster and [employer].  Clinical Impression   Patient evaluated by Physical Therapy with no further acute PT needs identified.  All education has been completed and the patient has no further questions. He is overall moving well, and managing independently in his room; no difficulties with mobility and walking; He intends to was with his son later today; Recommend he walks the hallways daily;  See below for any follow-up Physical Therapy or equipment needs. PT is signing off. Thank you for this referral.     Follow Up Recommendations No PT follow up    Equipment Recommendations  None recommended by PT    Recommendations for Other Services Speech consult (Consider follow up with Speech Therapy)     Precautions / Restrictions Precautions Precautions: Other (comment) Precaution Comments: Trach, feeding tube      Mobility  Bed Mobility Overal bed mobility: Independent             General bed mobility comments: EOB upon arrival  Transfers Overall transfer level: Independent                  Ambulation/Gait Ambulation/Gait assistance: Independent Gait Distance (Feet): 20 Feet (managing well in the room) Assistive device: None Gait Pattern/deviations: WFL(Within Functional Limits)     General Gait Details: No difficulty with amb, including managing the IV pole  Stairs            Wheelchair Mobility    Modified Rankin (Stroke Patients Only)       Balance                                             Pertinent Vitals/Pain Pain Assessment: 0-10 Pain Score: 5  (he wrote 4-5) Pain Location:  at throat and chest Pain Descriptors / Indicators: Constant Pain Intervention(s): Monitored during session;Premedicated before session    Home Living Family/patient expects to be discharged to:: Private residence Living Arrangements: Spouse/significant other               Additional Comments: Did not indicate any difficulty with mobility    Prior Function Level of Independence: Independent               Hand Dominance        Extremity/Trunk Assessment   Upper Extremity Assessment Upper Extremity Assessment: Defer to OT evaluation    Lower Extremity Assessment Lower Extremity Assessment: Overall WFL for tasks assessed       Communication   Communication: Expressive difficulties;Other (comment) (Good use of writing board)  Cognition Arousal/Alertness: Awake/alert Behavior During Therapy: WFL for tasks assessed/performed Overall Cognitive Status: Within Functional Limits for tasks assessed                                        General Comments General comments (skin integrity, edema, etc.): O2 sats ranged 90-95% with ambulation on Room Air    Exercises     Assessment/Plan    PT Assessment Patent does not need any  further PT services  PT Problem List         PT Treatment Interventions      PT Goals (Current goals can be found in the Care Plan section)  Acute Rehab PT Goals Patient Stated Goal: Reports he would like to walk with his son later PT Goal Formulation: All assessment and education complete, DC therapy    Frequency     Barriers to discharge        Co-evaluation PT/OT/SLP Co-Evaluation/Treatment:  ([place])             AM-PAC PT "6 Clicks" Mobility  Outcome Measure Help needed turning from your back to your side while in a flat bed without using bedrails?: None Help needed moving from lying on your back to sitting on the side of a flat bed without using bedrails?: None Help needed moving to and from a bed to a  chair (including a wheelchair)?: None Help needed standing up from a chair using your arms (e.g., wheelchair or bedside chair)?: None Help needed to walk in hospital room?: None Help needed climbing 3-5 steps with a railing? : None 6 Click Score: 24    End of Session Equipment Utilized During Treatment:  (None) Activity Tolerance: Patient tolerated treatment well Patient left: in chair;with call bell/phone within reach Nurse Communication: Mobility status PT Visit Diagnosis: Other (comment);Other abnormalities of gait and mobility (R26.89) (assessment post-op)    Time: 5396-7289 PT Time Calculation (min) (ACUTE ONLY): 12 min   Charges:   PT Evaluation $PT Eval Low Complexity: Powers, PT  Acute Rehabilitation Services Pager [phone] Office [place] 10/10/2020, 10:59 AM

## 2020-10-10 NOTE — Progress Notes (Signed)
  ENT PROGRESS NOTE   Subjective: Patient seen and examined at bedside. Patient is resting comfortably in bed. No issues overnight. He states pain is controlled with pain regimen, he is not requiring morphine. He states is is passing flatus, but has not had a bowel movement. He denies nausea, vomiting. He is tolerating his tube feeds at goal. Denies difficulty breathing. He states he was up out of bed yesterday.   Objective: Vital signs in last 24 hours: Temp:  [98 F (36.7 C)-98.5 F (36.9 C)] 98.1 F (36.7 C) (10/17 0419) Pulse Rate:  [76-80] 80 (10/17 0419) Resp:  [18] 18 (10/17 0419) BP: (122-138)/(56-59) 138/58 (10/17 0419) SpO2:  [94 %-96 %] 94 % (10/17 0419) FiO2 (%):  [21 %] 21 % (10/16 0855)  Gen: alert, cooperative, appropriate Head/ENT: EOMI, neck supple, mucus membranes moist and pink, conjunctiva clear  Neck: Bolster dressing in place, well secured. Incision lines C/D/I. No significant edema. Laryngectomy stoma inspected, widely patent with no obstructive crusting noted.  Lary tube removed and inspected, resecured with Velcro tie.  Chest: Pectoralis muscle flap tunnel with no significant edema. Dressing in place over incision line. JP drain exiting with serosanguinous drainage. 24 hour output: 100cc. Respiratory: Aphonic secondary to laryngectomy, non-labored breathing on room air, no accessory muscle use, normal HR, good O2 saturations   Recent Labs    10/09/20 0114  NA 130*  K 5.6*  CL 97*  CO2 22  GLUCOSE 400*  BUN 28*  CREATININE 1.10  CALCIUM 8.3*   Lab Results  Component Value Date   WBC 21.2 (H) 10/09/2020   HGB 9.6 (L) 10/09/2020   HCT 29.0 (L) 10/09/2020   MCV 93.9 10/09/2020   PLT 233 10/09/2020    Medications: I have reviewed the patient's current medications.  New Imaging:  No new imaging  Pathology: Pending  Assessment/Plan: Elchanan Bob is a 74 y.o. male with history of laryngectomy who is POD #2 from repair of pharyngocutaneous  fistula, reconstruction with pectoralis major myocutaneous flap, split thickness skin graft with Drs. Rosen and [employer].  -Continue pain control -Continue strict n.p.o. status. Continue tube feeds as per nutrition recommendations -Blood sugars improved today; continue insulin sliding scale, HbA1c pending -Frequent laryngectomy stoma care with removal of Lary tube, inspection of stoma, removal of any crusting -Please maintain HME or humidified trach collar over Lary tube at all times. If HME filter in place, trach collar unnecessary  -No pressure over left chest and neck -3 doses Clindamycin completed 10/16 -Continue JP drains to bulb suction, monitor output -Encourage ambulation to tolerance with assist, PT/OT consulted    LOS: 2 days    Jason Coop, [facility] 10/10/2020, 8:15 AM

## 2020-10-10 NOTE — Evaluation (Signed)
Occupational Therapy Evaluation Patient Details Name: Marcus Collier MRN: [id number] DOB: Dec 18, 1946 Today's Date: 10/10/2020    History of Present Illness Marcus Collier is a 74 y.o. male with history of laryngectomy who is s/p repair of pharyngocutaneous fistula, reconstruction with pectoralis major myocutaneous flap, split thickness skin graft with Drs. Constance Holster and [employer].   Clinical Impression   PTA pt living with family, functioning at independent level with as needed assist to manage trach. At time of eval, pt demonstrates ability to complete all transfers and ADL independently. Pt demonstrated safe functional mobility without noted LOB or safety concern. He primarily uses a writing board to communicate with, and noted no cognitive limitations. Pt was able to maintain O2 sats >93% on RA. No further OT needs are identified, OT will sign off. Messaged Dr. Constance Holster about an SLP order for pt. Thank you for this consult.    Follow Up Recommendations  No OT follow up    Equipment Recommendations  None recommended by OT    Recommendations for Other Services       Precautions / Restrictions Precautions Precautions: Other (comment) Precaution Comments: Trach, feeding tube Restrictions Weight Bearing Restrictions: No      Mobility Bed Mobility Overal bed mobility: Independent             General bed mobility comments: EOB upon arrival  Transfers Overall transfer level: Independent                    Balance Overall balance assessment: Independent                                         ADL either performed or assessed with clinical judgement   ADL Overall ADL's : Independent                                       General ADL Comments: Pt is at independent for ADL. He was able to complete transfers and mobility without external assist. Pt was then able to walk in/out of bathroom for toilet transfer independently. No safety concerns or  LOB noted     Vision Patient Visual Report: No change from baseline       Perception     Praxis      Pertinent Vitals/Pain Pain Assessment: 0-10 Pain Score: 5  Pain Location: at throat and chest Pain Descriptors / Indicators: Constant Pain Intervention(s): Limited activity within patient's tolerance;Premedicated before session     Hand Dominance     Extremity/Trunk Assessment Upper Extremity Assessment Upper Extremity Assessment: Overall WFL for tasks assessed   Lower Extremity Assessment Lower Extremity Assessment: Overall WFL for tasks assessed       Communication Communication Communication: Expressive difficulties;Other (comment) (uses writing board)   Cognition Arousal/Alertness: Awake/alert Behavior During Therapy: WFL for tasks assessed/performed Overall Cognitive Status: Within Functional Limits for tasks assessed                                 General Comments: uses writing board for communication   General Comments  O2 sats ranged 90-95% with ambulation on Room Air    Exercises     Shoulder Instructions      Home Living Family/patient expects to be discharged  to:: Private residence Living Arrangements: Spouse/significant other                               Additional Comments: Did not indicate any difficulty with mobility      Prior Functioning/Environment Level of Independence: Independent                 OT Problem List: Decreased knowledge of use of DME or AE;Decreased activity tolerance;Cardiopulmonary status limiting activity;Pain      OT Treatment/Interventions:      OT Goals(Current goals can be found in the care plan section) Acute Rehab OT Goals Patient Stated Goal: Reports he would like to walk with his son later OT Goal Formulation: All assessment and education complete, DC therapy  OT Frequency:     Barriers to D/C:            Co-evaluation              AM-PAC OT "6 Clicks" Daily  Activity     Outcome Measure Help from another person eating meals?: Total (feeding tube) Help from another person taking care of personal grooming?: None Help from another person toileting, which includes using toliet, bedpan, or urinal?: None Help from another person bathing (including washing, rinsing, drying)?: None Help from another person to put on and taking off regular upper body clothing?: None Help from another person to put on and taking off regular lower body clothing?: None 6 Click Score: 21   End of Session Nurse Communication: Mobility status  Activity Tolerance: Patient tolerated treatment well Patient left: in chair  OT Visit Diagnosis: Other abnormalities of gait and mobility (R26.89);Pain Pain - part of body:  (neck)                Time: 9169-4503 OT Time Calculation (min): 11 min Charges:  OT General Charges $OT Visit: 1 Visit OT Evaluation $OT Eval Low Complexity: [place], MSOT, OTR/L [facility] Office Number: [phone] Pager: [phone]  Zenovia Jarred 10/10/2020, 11:50 AM

## 2020-10-11 ENCOUNTER — Encounter (HOSPITAL_COMMUNITY): Payer: Self-pay | Admitting: Otolaryngology

## 2020-10-11 LAB — SURGICAL PATHOLOGY

## 2020-10-11 LAB — GLUCOSE, CAPILLARY
Glucose-Capillary: 105 mg/dL — ABNORMAL HIGH (ref 70–99)
Glucose-Capillary: 118 mg/dL — ABNORMAL HIGH (ref 70–99)
Glucose-Capillary: 123 mg/dL — ABNORMAL HIGH (ref 70–99)
Glucose-Capillary: 145 mg/dL — ABNORMAL HIGH (ref 70–99)
Glucose-Capillary: 182 mg/dL — ABNORMAL HIGH (ref 70–99)
Glucose-Capillary: 231 mg/dL — ABNORMAL HIGH (ref 70–99)
Glucose-Capillary: 284 mg/dL — ABNORMAL HIGH (ref 70–99)

## 2020-10-11 LAB — HEMOGLOBIN A1C
Hgb A1c MFr Bld: 6.2 % — ABNORMAL HIGH (ref 4.8–5.6)
Mean Plasma Glucose: 131 mg/dL

## 2020-10-11 NOTE — Care Management Important Message (Signed)
Important Message  Patient Details  Name: Marcus Collier MRN: [id number] Date of Birth: 1946/02/16   Medicare Important Message Given:  Yes     [name] 10/11/2020, 2:36 PM

## 2020-10-11 NOTE — Anesthesia Postprocedure Evaluation (Signed)
Anesthesia Post Note  Patient: Anthonymichael Munday  Procedure(s) Performed: CLOSURE ORAL ANTRAL FISTULA (N/A Neck) PHARYNGEAL RECONSTRUCTION WITH PECTORALIS MAJOR MYOCUTANEOUS FLAP (N/A Neck) SKIN GRAFT SPLIT THICKNESS (N/A Leg Upper)     Patient location during evaluation: PACU Anesthesia Type: General Level of consciousness: awake and alert Pain management: pain level controlled Vital Signs Assessment: post-procedure vital signs reviewed and stable Respiratory status: spontaneous breathing, nonlabored ventilation, respiratory function stable and patient connected to nasal cannula oxygen Cardiovascular status: blood pressure returned to baseline and stable Postop Assessment: no apparent nausea or vomiting Anesthetic complications: no   No complications documented.  Last Vitals:  Vitals:   10/11/20 0347 10/11/20 0427  BP:  (!) 119/52  Pulse: 85 74  Resp: 16 20  Temp:  36.9 C  SpO2: 92% 94%    Last Pain:  Vitals:   10/11/20 0427  TempSrc: Oral  PainSc: 0-No pain                 Jabarri Stefanelli S

## 2020-10-11 NOTE — Progress Notes (Signed)
Patient ID: Marcus Collier, male   DOB: 1946-07-17, 75 y.o.   MRN: [id number] Subjective: No complaints, doing well.  Objective: Vital signs in last 24 hours: Temp:  [98.4 F (36.9 C)-99.3 F (37.4 C)] 98.4 F (36.9 C) (10/18 0427) Pulse Rate:  [72-88] 72 (10/18 0827) Resp:  [16-20] 18 (10/18 0827) BP: (110-127)/(49-57) 119/52 (10/18 0427) SpO2:  [92 %-96 %] 92 % (10/18 0827) FiO2 (%):  [21 %] 21 % (10/18 0827) Weight change:  Last BM Date: 10/08/20  Intake/Output from previous day: 10/17 0701 - 10/18 0700 In: 3260.9 [I.V.:1680.9; NG/GT:1580] Out: 2168 [Urine:2130; Drains:38] Intake/Output this shift: Total I/O In: -  Out: 400 [Urine:400]  PHYSICAL EXAM: He is awake and alert.  Breathing is clear.  Stoma looks healthy.  Pedicle area left upper chest is soft.  Bolster in place over skin graft.  Lab Results: Recent Labs    10/09/20 0114  WBC 21.2*  HGB 9.6*  HCT 29.0*  PLT 233   BMET Recent Labs    10/09/20 0114 10/10/20 0830  NA 130* 133*  K 5.6* 4.9  CL 97* 100  CO2 22 25  GLUCOSE 400* 145*  BUN 28* 15  CREATININE 1.10 0.65  CALCIUM 8.3* 8.4*    Studies/Results: No results found.  Medications: I have reviewed the patient's current medications.  Assessment/Plan: Progressing nicely.  No signs of trouble.  Anticipate discharge in the next couple of days depending on plastic surgery.  LOS: 3 days   Izora Gala 10/11/2020, 9:10 AM

## 2020-10-11 NOTE — Evaluation (Signed)
Speech Language Pathology Evaluation Patient Details Name: Marcus Collier MRN: [id number] DOB: 01/25/1946 Today's Date: 10/11/2020 Time: 2117-0948 SLP Time Calculation (min) (ACUTE ONLY): 14 min  Problem List:  Patient Active Problem List   Diagnosis Date Noted  . Open wound of pharynx 10/08/2020  . 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 Protein-calorie malnutrition, severe 09/16/2020  . S/P laryngectomy 09/14/2020  . Pre-op evaluation 09/13/2020  . Coronary artery disease involving native coronary artery of native heart without angina pectoris 09/13/2020  . 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 Lesion of vocal cord 11/26/2012  . 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 Acute myocardial infarction (HCC)   . Aortic stenosis    mild AS 09/13/20 echo [facility] Cardiovascular)  . 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 Family history of adverse reaction to anesthesia   . Headache    migraine  . Hoarseness of voice   . Hypertension   . 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 S/P radiation therapy 12/10/2012 - 01/20/2013   Vocal Cords/ 63 Gray/ 28 Fractions  . Unspecified malignant neoplasm of skin, site unspecified    Past Surgical History:  Past Surgical History:  Procedure Laterality Date  . Biopsy of Vocal cord  11/15/12   Right and Left  . Biospy mouth  11/15/12  . CARDIAC CATHETERIZATION     with stents   . CLOSURE ORAL ANTRAL FISTULA N/A 10/08/2020   Procedure: CLOSURE ORAL ANTRAL FISTULA;  Surgeon: Serena Colonel, MD;  Location: [facility] OR;  Service: ENT;  Laterality: N/A;  . COLONOSCOPY    . CORONARY ANGIOPLASTY    . DIRECT LARYNGOSCOPY N/A 08/11/2020   Procedure: DIRECT LARYNGOSCOPY WITH BIOPSY;  Surgeon: Serena Colonel, MD;  Location: [facility] OR;  Service: ENT;  Laterality: N/A;  . GASTROSTOMY TUBE PLACEMENT  09/28/2020  . IR GASTROSTOMY TUBE MOD SED  09/28/2020  .  LARYNGETOMY N/A 09/14/2020   Procedure: LARYNGECTOMY Total;  Surgeon: Serena Colonel, MD;  Location: [facility] OR;  Service: ENT;  Laterality: N/A;  NEEDS RNFA  . PECTORALIS FLAP N/A 10/08/2020   Procedure: PHARYNGEAL RECONSTRUCTION WITH PECTORALIS MAJOR MYOCUTANEOUS FLAP;  Surgeon: Allena Napoleon, MD;  Location: [facility] OR;  Service: Plastics;  Laterality: N/A;  . 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 He is admitted for pharyngocutaneous fistula, reconstruction with pectoralis major myocutaneous flap, split thickness skin graft with Drs. Pollyann Kennedy and [employer]. Pt also s/p g-tube placement. 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 Pt can verbalize maintenance and care for HMEs and larytubes with Mod I. He also donned/doffed his HME without any assistance needed. He confirms that he received his Coming Home Kit and that he has adequate supplies at the moment. He has necessary contact information for [employer] when he needs to reorder. Pt does not have his EL with him this admission and says that he uses it very little at home. Encouraged him to practice it more and even bring  it to the hospital with him. Pt has yet seen or heard from anyone with [facility] SLP services. Overall pt seems to be progressing well from SLP standpoint, and his needs can likely be best addressed on an outpatient basis. Recommend ordering OP SLP upon discharge. SLP will be available to pt on an as needed basis acutely.     SLP Assessment  SLP Recommendation/Assessment: All further Speech Lanaguage Pathology  needs can be addressed in the next  venue of care SLP Visit Diagnosis: Aphonia (R49.1)    Follow Up Recommendations  Outpatient SLP    Frequency and Duration           SLP Evaluation Cognition  Overall Cognitive Status: Within Functional Limits for tasks assessed       Comprehension  Auditory Comprehension Overall Auditory Comprehension: Appears within functional limits for tasks assessed    Expression Expression Primary Mode of Expression: Other (comment) (multimodal) Verbal Expression Overall Verbal Expression: Appears within functional limits for tasks assessed   Oral / Motor  Motor Speech Overall Motor Speech: Impaired Respiration: Within functional limits Phonation: Aphonic Resonance:  (N/A) Interfering Components:  (h/o TL)   GO                    Osie Bond., M.A. [place] Acute Rehabilitation Services Pager [phone] Office [phone]  10/11/2020, 10:57 AM

## 2020-10-12 LAB — GLUCOSE, CAPILLARY
Glucose-Capillary: 120 mg/dL — ABNORMAL HIGH (ref 70–99)
Glucose-Capillary: 163 mg/dL — ABNORMAL HIGH (ref 70–99)
Glucose-Capillary: 207 mg/dL — ABNORMAL HIGH (ref 70–99)
Glucose-Capillary: 247 mg/dL — ABNORMAL HIGH (ref 70–99)
Glucose-Capillary: 88 mg/dL (ref 70–99)
Glucose-Capillary: 89 mg/dL (ref 70–99)

## 2020-10-12 NOTE — Progress Notes (Signed)
Patient ID: Marcus Collier, male   DOB: 1946-03-11, 74 y.o.   MRN: [id number] Subjective: Doing well, no complaints.  Had a little bit of bloody sputum yesterday but no problems currently.  Objective: Vital signs in last 24 hours: Temp:  [98 F (36.7 C)-98.7 F (37.1 C)] 98.7 F (37.1 C) (10/19 0344) Pulse Rate:  [62-84] 62 (10/19 0834) Resp:  [16-20] 18 (10/19 0834) BP: (118-127)/(48-55) 118/50 (10/19 0344) SpO2:  [92 %-97 %] 94 % (10/19 0834) FiO2 (%):  [21 %] 21 % (10/19 0446) Weight:  [65.9 kg] 65.9 kg (10/19 0353) Weight change:  Last BM Date: 10/11/20  Intake/Output from previous day: 10/18 0701 - 10/19 0700 In: 1618.2 [I.V.:1618.2] Out: 2370 [Urine:2350; Drains:20] Intake/Output this shift: Total I/O In: -  Out: 500 [Urine:500]  PHYSICAL EXAM: He is awake and alert.  He is breathing comfortably.  Larry tube in place.  Bolster in place.  Pectoralis donor site looks excellent.  Drain in place.  Lab Results: No results for input(s): WBC, HGB, HCT, PLT in the last 72 hours. BMET Recent Labs    10/10/20 0830  NA 133*  K 4.9  CL 100  CO2 25  GLUCOSE 145*  BUN 15  CREATININE 0.65  CALCIUM 8.4*    Studies/Results: No results found.  Medications: I have reviewed the patient's current medications.  Assessment/Plan: Stable postop.  We will plan on discharge home today or tomorrow when home health needs are all established.  He will follow-up in clinic with Dr. Claudia Desanctis for drain and bolster removal and with me for further evaluation.  LOS: 4 days   Izora Gala 10/12/2020, 8:57 AM

## 2020-10-12 NOTE — Care Management (Addendum)
Spoke to patient and wife at bedside. They report not having HME supplies at home. Wife stated she called [facility] and was told they need a MD prescription faxed to them.   NCM had confirmed [facility] received the prescription on discharge. Laurena Spies also confirmed [employer] received all required documentation.   NCM spoke to Laurena Spies she her note 10/01/20 where she also confirmed they received "Coming Home Kit".   Wife confirmed she did receive 30 day supply of HME supplies , she was getting nervous since it's Oct 12, 2020. Confirmed with [employer] they have paperwork.   Mia from [facility] will call Levander Campion or Levander Campion can call her [phone]   Also, patient received one case of Osmolite tube feeding , then received Ensure plus. NCM has called Zach with [facility] and awaiting call back.  Wife would like a call from [employer] provided [employer] with wife's cell phone number [phone] . [employer] will call.   Asked Dianne about [facility] , she stated the home health agency wanted her to sign papers for 60 days and she did not.   Discussed all of above with Dr Constance Holster and bedside nurse.    Magdalen Spatz RN

## 2020-10-12 NOTE — Discharge Instructions (Signed)
Do not eat or drink anything by mouth.  Dr. Claudia Desanctis will remove the drain in the dressing at the end of the week.  All medicines should be through the feeding tube.

## 2020-10-12 NOTE — Progress Notes (Signed)
Patient looking good from my perspective.  Neck bolster dressing is holding in place with no signs of any issue.  Flap palpable in the upper neck and chest seems soft.  Donor site in the chest is without subcutaneous fluid and is healing well.  Donor site on the thigh looks fine.  Drain is not putting out very much but I plan to leave it for another few days.  I plan to take the bolster dressing off the neck at about 1 week postop.  We will follow along and monitor depending on discharge, but is okay to go home from my perspective.

## 2020-10-12 NOTE — Discharge Summary (Signed)
  Physician Discharge Summary  Patient ID: Marcus Collier MRN: [id number] DOB/AGE: [id number] 76 y.o.  Admit date: 10/08/2020 Discharge date: 10/12/2020  Admission Diagnoses:Pharyngocutaneous fistula  Discharge Diagnoses:  Active Problems:   Open wound of pharynx   Discharged Condition: good  Hospital Course: no complications  Consults: none  Significant Diagnostic Studies: none  Treatments: surgery: Pectoralis major myocutaneous flap reconstruction of pharyngocutaneous fistula, split-thickness skin graft.  Discharge Exam: Blood pressure (!) 114/48, pulse 77, temperature 98.1 F (36.7 C), temperature source Oral, resp. rate 17, height 5' 8.5" (1.74 m), weight 65.9 kg, SpO2 97 %. PHYSICAL EXAM: He is awake and alert.  Stoma is clear and healthy.  JP drain in place in the chest wall.  Bolster in place overlying skin graft.  Flap appears to be viable.  Disposition: Discharge disposition: 01-Home or Self Care       Discharge Instructions    Increase activity slowly   Complete by: As directed    No wound care   Complete by: As directed      Allergies as of 10/12/2020      Reactions   Penicillins Other (See Comments)   Unknown reaction/ Childhood      Medication List    TAKE these medications   acetaminophen 500 MG tablet Commonly known as: TYLENOL Take 500 mg by mouth every 6 (six) hours as needed for headache.   amLODipine 10 MG tablet Commonly known as: NORVASC Take 10 mg by mouth every morning.   atorvastatin 80 MG tablet Commonly known as: LIPITOR Take 80 mg by mouth every evening.   hydrALAZINE 25 MG tablet Commonly known as: APRESOLINE Take 25 mg by mouth 2 (two) times daily.   HYDROcodone-acetaminophen 7.5-325 mg/15 ml solution Commonly known as: HYCET Place 15 mLs into feeding tube every 6 (six) hours as needed for moderate pain.   ibuprofen 200 MG tablet Commonly known as: ADVIL Take 200 mg by mouth every 6 (six) hours as needed for  headache.   quinapril 40 MG tablet Commonly known as: ACCUPRIL Take 40 mg by mouth every morning.       Follow-up Information    Cindra Presume, MD. Schedule an appointment as soon as possible for a visit on 10/15/2020.   Specialty: Plastic Surgery Contact information: [address] [place] [address] [phone]        Izora Gala, MD. Schedule an appointment as soon as possible for a visit in 1 week(s).   Specialty: Otolaryngology Contact information: [address] [place] [address] [phone]               Signed: Izora Gala 10/12/2020, 2:57 PM

## 2020-10-13 LAB — GLUCOSE, CAPILLARY
Glucose-Capillary: 116 mg/dL — ABNORMAL HIGH (ref 70–99)
Glucose-Capillary: 122 mg/dL — ABNORMAL HIGH (ref 70–99)
Glucose-Capillary: 223 mg/dL — ABNORMAL HIGH (ref 70–99)
Glucose-Capillary: 64 mg/dL — ABNORMAL LOW (ref 70–99)

## 2020-10-13 NOTE — Care Management (Addendum)
[place] regarding tube feeding, spoke with Freda Munro, she will follow up , and update  NCM regarding osomite / ensure plus. Patient prefers osmolite    1300 Confirmed with Freda Munro with [facility] patient has Ensure at home, they needed to substitute for Osmolite, she will have a dietitian from [employer] call wife.   Also confirmed with Mia Ciano at [facility] , patient has HME supplies. There is a form she needs patient's wife to sign , Mia will reach out to wife.   Magdalen Spatz RN

## 2020-10-13 NOTE — Plan of Care (Signed)
  Problem: Clinical Measurements: Goal: Ability to maintain clinical measurements within normal limits will improve Outcome: Adequate for Discharge   Problem: Activity: Goal: Risk for activity intolerance will decrease Outcome: Adequate for Discharge   Problem: Nutrition: Goal: Adequate nutrition will be maintained Outcome: Adequate for Discharge   

## 2020-10-18 ENCOUNTER — Ambulatory Visit (INDEPENDENT_AMBULATORY_CARE_PROVIDER_SITE_OTHER): Payer: Medicare Other | Admitting: Plastic Surgery

## 2020-10-18 ENCOUNTER — Other Ambulatory Visit: Payer: Self-pay

## 2020-10-18 ENCOUNTER — Ambulatory Visit: Payer: Medicare Other | Admitting: Plastic Surgery

## 2020-10-18 ENCOUNTER — Encounter: Payer: Self-pay | Admitting: Plastic Surgery

## 2020-10-18 VITALS — BP 112/68 | HR 74 | Temp 99.0°F

## 2020-10-18 DIAGNOSIS — J392 Other diseases of pharynx: Secondary | ICD-10-CM

## 2020-10-18 NOTE — Progress Notes (Signed)
Patient presents a little over week postop from left pectoralis myocutaneous flap reconstruction for pharyngocutaneous fistula.  He overall feels well just has some anticipated pain in the neck and chest.  On exam everything is healing nicely.  I removed his bolster and most of his staples today revealing good take of the skin graft with healthy tissue and no signs of any fluid leakage.  There was very minimal output from the JP drain in the left chest so this was removed.  The incision along his chest at the donor site is healing nicely.  He has an appointment with Dr. Constance Holster later this week and then will see him about 2 weeks after that to check his progress.  Of asked him to try to keep a dressing over the flap in the neck to protect it for another couple weeks.  After that I think it should be pretty well epithelialized.

## 2020-10-20 ENCOUNTER — Ambulatory Visit: Payer: Medicare Other | Admitting: Plastic Surgery

## 2020-10-25 ENCOUNTER — Other Ambulatory Visit: Payer: Self-pay | Admitting: Otolaryngology

## 2020-10-25 DIAGNOSIS — C329 Malignant neoplasm of larynx, unspecified: Secondary | ICD-10-CM

## 2020-10-25 DIAGNOSIS — Z923 Personal history of irradiation: Secondary | ICD-10-CM

## 2020-10-25 DIAGNOSIS — J392 Other diseases of pharynx: Secondary | ICD-10-CM

## 2020-10-26 ENCOUNTER — Other Ambulatory Visit: Payer: Self-pay | Admitting: Otolaryngology

## 2020-10-26 DIAGNOSIS — C329 Malignant neoplasm of larynx, unspecified: Secondary | ICD-10-CM

## 2020-10-31 NOTE — Progress Notes (Deleted)
Patient is a 74 year old male who underwent repair of pharyngocutaneous fistula with Dr. Constance Holster and pharyngeal reconstruction with pectoralis major mycutaneous flap and STSG with Dr. Claudia Desanctis on 10/08/20. At last visit on 10/25 he was healing well with good take of the skin graft. Bolster and most of staples removed. Left chest drain removed.   ~ 4 weeks PO

## 2020-11-01 ENCOUNTER — Ambulatory Visit
Admission: RE | Admit: 2020-11-01 | Discharge: 2020-11-01 | Disposition: A | Discharge status: Home / Self Care | Payer: Medicare Other | Source: Ambulatory Visit | Attending: Otolaryngology | Admitting: Otolaryngology

## 2020-11-01 DIAGNOSIS — C329 Malignant neoplasm of larynx, unspecified: Secondary | ICD-10-CM

## 2020-11-01 DIAGNOSIS — Z923 Personal history of irradiation: Secondary | ICD-10-CM

## 2020-11-01 DIAGNOSIS — J392 Other diseases of pharynx: Secondary | ICD-10-CM

## 2020-11-01 MED ORDER — IOPAMIDOL (ISOVUE-300) INJECTION 61%
75.0000 mL | Freq: Once | INTRAVENOUS | Status: AC | PRN
  Administered 2020-11-01: 75 mL via INTRAVENOUS

## 2020-11-03 ENCOUNTER — Other Ambulatory Visit: Payer: Self-pay

## 2020-11-04 ENCOUNTER — Ambulatory Visit: Payer: Medicare Other | Admitting: Plastic Surgery

## 2020-11-04 DIAGNOSIS — J392 Other diseases of pharynx: Secondary | ICD-10-CM

## 2020-11-05 ENCOUNTER — Other Ambulatory Visit (HOSPITAL_COMMUNITY): Payer: Self-pay | Admitting: Otolaryngology

## 2020-11-05 ENCOUNTER — Other Ambulatory Visit: Payer: Self-pay | Admitting: Otolaryngology

## 2020-11-05 DIAGNOSIS — Z8521 Personal history of malignant neoplasm of larynx: Secondary | ICD-10-CM

## 2020-11-05 DIAGNOSIS — C770 Secondary and unspecified malignant neoplasm of lymph nodes of head, face and neck: Secondary | ICD-10-CM

## 2020-11-05 DIAGNOSIS — C329 Malignant neoplasm of larynx, unspecified: Secondary | ICD-10-CM

## 2020-11-08 ENCOUNTER — Telehealth: Payer: Self-pay | Admitting: Hematology and Oncology

## 2020-11-08 NOTE — Telephone Encounter (Signed)
Received a new pt referral from Dr. Constance Holster for malignant neoplasm of larynx. Marcus Collier has been scheduled to see Dr. Chryl Heck on 12/1 at 11am. Appt date and time has been given to the pt's wife.

## 2020-11-09 ENCOUNTER — Other Ambulatory Visit: Payer: Medicare Other

## 2020-11-10 ENCOUNTER — Other Ambulatory Visit: Payer: Self-pay | Admitting: Otolaryngology

## 2020-11-10 ENCOUNTER — Ambulatory Visit
Admission: RE | Admit: 2020-11-10 | Discharge: 2020-11-10 | Disposition: A | Discharge status: Home / Self Care | Payer: Medicare Other | Source: Ambulatory Visit | Attending: Otolaryngology | Admitting: Otolaryngology

## 2020-11-10 DIAGNOSIS — Z8521 Personal history of malignant neoplasm of larynx: Secondary | ICD-10-CM

## 2020-11-10 DIAGNOSIS — C770 Secondary and unspecified malignant neoplasm of lymph nodes of head, face and neck: Secondary | ICD-10-CM

## 2020-11-15 ENCOUNTER — Encounter
Admission: RE | Admit: 2020-11-15 | Discharge: 2020-11-15 | Disposition: A | Discharge status: Home / Self Care | Payer: Medicare Other | Source: Ambulatory Visit | Attending: Otolaryngology | Admitting: Otolaryngology

## 2020-11-15 ENCOUNTER — Other Ambulatory Visit: Payer: Self-pay

## 2020-11-15 DIAGNOSIS — C329 Malignant neoplasm of larynx, unspecified: Secondary | ICD-10-CM | POA: Insufficient documentation

## 2020-11-15 LAB — GLUCOSE, CAPILLARY: Glucose-Capillary: 155 mg/dL — ABNORMAL HIGH (ref 70–99)

## 2020-11-15 MED ORDER — FLUDEOXYGLUCOSE F - 18 (FDG) INJECTION
7.5000 | Freq: Once | INTRAVENOUS | Status: AC | PRN
  Administered 2020-11-15: 7.877 via INTRAVENOUS

## 2020-11-16 ENCOUNTER — Telehealth: Payer: Self-pay | Admitting: *Deleted

## 2020-11-16 ENCOUNTER — Other Ambulatory Visit: Payer: Self-pay

## 2020-11-16 DIAGNOSIS — C329 Malignant neoplasm of larynx, unspecified: Secondary | ICD-10-CM

## 2020-11-16 NOTE — Progress Notes (Signed)
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 Izora Gala) He lost his voice about 6 months ago. In the past 2 months he has developed right ear pain and sore throat he developed worsening shortness of breath and noisy breathing.   -09/14/2020: (Dr. Izora Gala) He has been in emergency department for several hours.  He has received racemic epi treatments and steroids/oxygen but continues to have noisy breathing and difficulty keeping saturations above 90%.  Has a history of laryngeal cancer treated with radiation several years ago, recently diagnosed with a new laryngeal cancer.  He had cardiology work-up yesterday and was cleared for surgery. -09/28/2020: (Dr. Melida Quitter) He underwent total laryngectomy by Dr. Constance Holster on 9/21 and remained in the hospital until late last week after he was allowed to swallow on POD 10.  Yesterday, he noticed some bleeding at the stoma and then some leaking of swallowed material out of the neck incision.  He presented to the office today and is being readmitted due to pharyngocutaneous fistula  Biopsies revealed:  09/14/2020 FINAL MICROSCOPIC DIAGNOSIS:  A. LARYNX, LARYNGECTOMY:  - Invasive moderately to poorly differentiated squamous cell carcinoma.  - Tumor invades through the thyroid cartilage.  - Margins of resection are not involved.  08/11/2020 FINAL MICROSCOPIC DIAGNOSIS:  A. LARYNX, LEFT FALSE CORD, BIOPSY:  - At least squamous cell carcinoma in situ, see comment  B. LARYNX, RIGHT AE FOLD, BIOPSY:  - Invasive moderately differentiated squamous cell carcinoma  Nutrition Status Yes No Comments  Weight changes? [x]  []    Swallowing concerns? [x]  []  Yes--patient currently can only tolerate a liquid diet  PEG? [x]  []     Referrals Yes No Comments  Social Work? []  [x]     Dentistry? []  [x]    Swallowing therapy? [x]  []    Nutrition? [x]  []    Med/Onc? [x]  []     Safety Issues Yes No Comments  Prior radiation? [x]  []  Completed radiotherapy on 01/20/2013 to a dose of 63 Gray in 28 fractions  Pacemaker/ICD? []  [x]    Possible current pregnancy? []  [x]  N/A  Is the patient on methotrexate? []  [x]     Tobacco/Marijuana/Snuff/ETOH use: Occasionally drinks alcohol (socially), but not currently. Former smoker; does not use smokeless tobacco or illicit drugs  Past/Anticipated interventions by otolaryngology, if any:  10/08/2020 Dr. Izora Gala and Dr. Mingo Amber (plastic surgeon) 1.  Repair of  PHARYNGOCUTANEOUS FISTULA 2.  PHARYNGEAL RECONSTRUCTION WITH PECTORALIS MAJOR MYOCUTANEOUS FLAP 3.  SKIN GRAFT SPLIT THICKNESS  09/14/2020 Dr. Izora Gala Awake local tracheostomy, LARYNGECTOMY Total  08/11/2020 Dr. Izora Gala DIRECT LARYNGOSCOPY WITH BIOPSY  Past/Anticipated interventions by medical oncology, if any:  Scheduled for consult with Dr. Benay Pike on 11/24/2020   Current Complaints / other details:   Patient reports constant head pain (8 out of 10) pain that doesn't seem to subside even with prescription and OTC pain relievers.

## 2020-11-16 NOTE — Telephone Encounter (Signed)
CALLED PATIENT TO INFORM OF LAB APPT. FOR 11-17-20 @ 12:15 PM, LVM FOR A RETURN CALL

## 2020-11-17 ENCOUNTER — Other Ambulatory Visit: Payer: Self-pay | Admitting: Hematology and Oncology

## 2020-11-17 ENCOUNTER — Encounter: Payer: Self-pay | Admitting: Radiation Oncology

## 2020-11-17 ENCOUNTER — Other Ambulatory Visit: Payer: Self-pay

## 2020-11-17 ENCOUNTER — Ambulatory Visit
Admission: RE | Admit: 2020-11-17 | Discharge: 2020-11-17 | Disposition: A | Discharge status: Home / Self Care | Payer: Medicare Other | Source: Ambulatory Visit | Attending: Radiation Oncology | Admitting: Radiation Oncology

## 2020-11-17 ENCOUNTER — Ambulatory Visit: Payer: Medicare Other

## 2020-11-17 ENCOUNTER — Ambulatory Visit: Payer: Medicare Other | Admitting: Radiation Oncology

## 2020-11-17 VITALS — BP 115/62 | HR 97 | Temp 97.0°F | Resp 16 | Ht 68.5 in | Wt 144.0 lb

## 2020-11-17 DIAGNOSIS — I10 Essential (primary) hypertension: Secondary | ICD-10-CM

## 2020-11-17 DIAGNOSIS — I350 Nonrheumatic aortic (valve) stenosis: Secondary | ICD-10-CM | POA: Insufficient documentation

## 2020-11-17 DIAGNOSIS — C328 Malignant neoplasm of overlapping sites of larynx: Secondary | ICD-10-CM

## 2020-11-17 DIAGNOSIS — D649 Anemia, unspecified: Secondary | ICD-10-CM | POA: Insufficient documentation

## 2020-11-17 DIAGNOSIS — M47812 Spondylosis without myelopathy or radiculopathy, cervical region: Secondary | ICD-10-CM | POA: Insufficient documentation

## 2020-11-17 DIAGNOSIS — E860 Dehydration: Secondary | ICD-10-CM | POA: Insufficient documentation

## 2020-11-17 DIAGNOSIS — Z923 Personal history of irradiation: Secondary | ICD-10-CM | POA: Insufficient documentation

## 2020-11-17 DIAGNOSIS — C320 Malignant neoplasm of glottis: Secondary | ICD-10-CM | POA: Insufficient documentation

## 2020-11-17 DIAGNOSIS — R918 Other nonspecific abnormal finding of lung field: Secondary | ICD-10-CM | POA: Insufficient documentation

## 2020-11-17 DIAGNOSIS — Z79899 Other long term (current) drug therapy: Secondary | ICD-10-CM | POA: Diagnosis not present

## 2020-11-17 DIAGNOSIS — R590 Localized enlarged lymph nodes: Secondary | ICD-10-CM | POA: Insufficient documentation

## 2020-11-17 DIAGNOSIS — I714 Abdominal aortic aneurysm, without rupture: Secondary | ICD-10-CM | POA: Insufficient documentation

## 2020-11-17 DIAGNOSIS — Z87891 Personal history of nicotine dependence: Secondary | ICD-10-CM | POA: Insufficient documentation

## 2020-11-17 DIAGNOSIS — C329 Malignant neoplasm of larynx, unspecified: Secondary | ICD-10-CM

## 2020-11-17 DIAGNOSIS — I252 Old myocardial infarction: Secondary | ICD-10-CM | POA: Insufficient documentation

## 2020-11-17 DIAGNOSIS — K5900 Constipation, unspecified: Secondary | ICD-10-CM | POA: Insufficient documentation

## 2020-11-17 DIAGNOSIS — Z510 Encounter for antineoplastic radiation therapy: Secondary | ICD-10-CM | POA: Insufficient documentation

## 2020-11-17 LAB — BUN & CREATININE (CHCC)
BUN: 22 mg/dL (ref 8–23)
Creatinine: 0.79 mg/dL (ref 0.61–1.24)
GFR, Estimated: >60 mL/min (ref >60)

## 2020-11-17 MED ORDER — SODIUM CHLORIDE 0.9% FLUSH
10.0000 mL | Freq: Once | INTRAVENOUS | Status: AC
  Administered 2020-11-17: 10 mL via INTRAVENOUS

## 2020-11-17 MED ORDER — HYDRALAZINE HCL 10 MG PO TABS: 10.0000 mg | ORAL_TABLET | Freq: Two times a day (BID) | ORAL | 0 refills | Status: AC

## 2020-11-17 MED ORDER — HYDROCODONE-ACETAMINOPHEN 7.5-325 MG/15ML PO SOLN: 15.0000 mL | Freq: Four times a day (QID) | ORAL | 0 refills | Status: DC | PRN

## 2020-11-17 NOTE — Progress Notes (Signed)
Oncology Nurse Navigator Documentation  Met with patient during initial consult with Dr. Isidore Moos. He was accompanied by his wife Diane. . Further introduced myself as his/their Navigator, explained my role as a member of the Care Team. . Provided New Patient Information packet: o Contact information for physician, this navigator, other members of the Care Team o Advance Directive information ([place] blue pamphlet with LCSW insert); provided [facility] AD booklet at his request,  o Fall Prevention Patient [place] sheet o Symptom Management Clinic information o [facility] campus map with highlight of [place] o SLP Information sheet . Assisted with post-consult appt scheduling. . They verbalized understanding of information provided. . I encouraged them to call with questions/concerns moving forward.  Harlow Asa, RN, BSN, OCN Head & Neck Oncology Nurse [place] [facility] [place] [phone]

## 2020-11-17 NOTE — Progress Notes (Signed)
I received a message from our NN if labs can be ordered to evaluate anemia given his upcoming appointment . Ordered, will ask our NN team to coordinate lab visit.

## 2020-11-17 NOTE — Progress Notes (Signed)
Has armband been applied?  Yes.    Does patient have an allergy to IV contrast dye?: No.   Has patient ever received premedication for IV contrast dye?: No.   Does patient take metformin?: No.  Date of lab work: November 17, 2020 BUN: 22 CR: 0.79  IV site: antecubital right, condition patent and no redness  Has IV site been added to flowsheet?  Yes.    12:33 BP: 115/62 HR: 97 T: 97 (temporal) R: 16 O2: 96%

## 2020-11-17 NOTE — Progress Notes (Signed)
Radiation Oncology         [phone] ________________________________  Re-Consultation (Outpatient)  Name: Marcus Collier MRN: [id number]  Date: 11/17/2020  DOB: 04-06-1946  CC:[name], MD  [name], MD   REFERRING PHYSICIAN: [name], MD  DIAGNOSIS:    ICD-10-CM   1. Squamous cell carcinoma of glottis (HCC)  C32.0 hydrALAZINE (APRESOLINE) 10 MG tablet    HYDROcodone-acetaminophen (HYCET) 7.5-325 mg/15 ml solution  2. 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 Hypertension, unspecified type  I10 hydrALAZINE (APRESOLINE) 10 MG tablet  4. Cancer of vocal cord (HCC)  C32.0    Recurrent laryngeal cancer  CHIEF COMPLAINT: Here to discuss management of throat cancer  HISTORY OF PRESENT ILLNESS::Marcus Collier is a 74 y.o. male who was initially seen by me in 2013 for glottic squamous cell carcinoma. He was treated with 63 Gy in 28 fractions directed at the vocal cords from 12/10/2012 - 01/20/2013. He was last seen in follow-up on 10/31/2015, at which time he was referred directly to ENT for evaluation of increased hoarseness and erythematous/bloody vocal cords.   Most recently, the patient was seen by Dr. Constance Holster for two-month history of right ear pain, right neck pain, sore throat, and worsening hoarseness. Soft tissue neck CT scan on 08/05/2020 showed likely post radiation changes in the hypopharynx and larynx. It also showed a possible superimposed recurrent malignancy at the level of the glottis and vestibule. There were no enlarged or necrotic lymph nodes. CT scan of chest on that same day showed multiple bilateral pulmonary nodules; the largest part solid nodule measured 8 mm in the right lower lobe. There was also a partially visualized proximal abdominal aortic aneurysm that measured up to 3.4 cm in anterior-posterior dimension.   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  Subsequently, the patient was seen in the ED on 09/14/2020 with chief compliant of shortness of breath. An awake total tracheostomy and total laryngectomy was performed at that time, which revealed invasive moderately to poorly differentiated squamous cell carcinoma with tumor invasion through the thyroid cartilage. Margins of resection were uninvolved.  Nodes were not removed.  The patient was admitted to the hospital and discharged home on 09/24/2020. 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 A gastrostomy tube was inserted on 09/28/2020. The patient was discharged home on 10/02/2020 but was then re-admitted on 10/06/2020 for surgical debridement and repair with pectoralis major myocutaneous flap that was performed on 10/08/2020. He was finally discharged home on 10/12/2020.   Most recent imaging imaging includes: 1. CT scan of head on 11/01/2020 for two-month history of headaches. Results showed mild but stable cerebral atrophy without evidence of acute intracranial abnormality. (To my eye I see slight increase in ventricular size bilaterally)  2. Soft tissue neck CT scan on 11/01/2020 that showed non-specific diffuse mucosal/submucosal edema within the nasopharynx and oropharynx. There was a new 0.8 cm peripherally enhancing focus in the region of the right palatine tonsil, which may reflect metachronous tumor or a necrotic retropharyngeal lymph node. There was also noted to be new, bulky, necrotic bilateral level 2/3 lymphadenopathy. The dominant nodal mass  on the right measured 4.3 x 3.2 cm and the dominant nodal mass on the left measured 3.6 x 3.7 cm. Additionally, there was  scattered small foci of gas within the ventral neck soft tissues at the level of the neopharynx, likely reflecting sinus tracts. No definite extension to the skin surface was identified. Finally, there was bulky lymphadenopathy that completed effaced the jugular veins; suspected thrombus within the left internal jugular vein at the level of the upper neck.  3. PET scan on 11/15/2020 showed intensely hypermetabolic bulky bilateral cervical adenopathy that was consistent with metastatic disease. There was also noted to be a probable costal injury in the anterior chest wall at the left 6th costochondral junction and a stable bilobed abdominal aortic aneurysm. There were no focal lesions of the pharyngeal mucosal space status post total laryngectomy, nor was there any distant metastatic disease.  Swallowing issues, if any: Yes, he has only able to tolerate a liquid diet.  He is mainly dependent on his PEG tube.  Weight Changes:  Wt Readings from Last 3 Encounters:  11/17/20 144 lb (65.3 kg)  10/13/20 145 lb 1 oz (65.8 kg)  10/01/20 150 lb 9.2 oz (68.3 kg)   Pain status: 8 out of 10 headaches.  Denies any significant neck pain  Other symptoms: He has constipation.  He fell recently and relates this to dizziness.  The dizziness has been going on for weeks.  The headaches are his greatest concern symptomatically -  8 out of 10 in severity and migratory in nature.  He also reports poor taste in his mouth.  His wife thinks he might be dehydrated.  He has had limited fluid intake.  Tobacco history, if any: He recently quit smoking  ETOH abuse, if any: Drinks alcohol socially  Prior cancers, if any: glottic squamous cell carcinoma (2013)  PREVIOUS RADIATION THERAPY: Yes Glottic Squamous cell carcinoma, T1b N0 M0  Radiation treatment dates:   12/10/2012-01/20/2013  Site/dose:   vocal cords/ 63 Gray in 28 fractions  Beams/energy:   Opposed laterals / 6MV photons  PAST MEDICAL HISTORY:  has a past  medical history of Acute myocardial infarction [facility]), Aortic stenosis, Cancer of vocal cord ([place]) (11/15/12), Family history of adverse reaction to anesthesia, Headache, Hoarseness of voice, Hypertension, 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, S/P radiation therapy (12/10/2012 - 01/20/2013), and Unspecified malignant neoplasm of skin, site unspecified.    PAST SURGICAL HISTORY: Past Surgical History:  Procedure Laterality Date  . Biopsy of Vocal cord  11/15/12   Right and Left  . Biospy mouth  11/15/12  . CARDIAC CATHETERIZATION     with stents   . CLOSURE ORAL ANTRAL FISTULA N/A 10/08/2020   Procedure: CLOSURE ORAL ANTRAL FISTULA;  Surgeon: Izora Gala, MD;  Location: [place];  Service: ENT;  Laterality: N/A;  . COLONOSCOPY    . CORONARY ANGIOPLASTY    . DIRECT LARYNGOSCOPY N/A 08/11/2020   Procedure: DIRECT LARYNGOSCOPY WITH BIOPSY;  Surgeon: Izora Gala, MD;  Location: [place];  Service: ENT;  Laterality: N/A;  . GASTROSTOMY TUBE PLACEMENT  09/28/2020  . IR GASTROSTOMY TUBE MOD SED  09/28/2020  . LARYNGETOMY N/A 09/14/2020   Procedure: LARYNGECTOMY Total;  Surgeon: Izora Gala, MD;  Location: [place];  Service: ENT;  Laterality: N/A;  NEEDS RNFA  . PECTORALIS FLAP N/A 10/08/2020   Procedure: PHARYNGEAL RECONSTRUCTION WITH PECTORALIS MAJOR MYOCUTANEOUS FLAP;  Surgeon: Cindra Presume, MD;  Location: [place];  Service: Plastics;  Laterality: N/A;  .  SKIN SPLIT GRAFT N/A 10/08/2020   Procedure: SKIN GRAFT SPLIT THICKNESS;  Surgeon: Cindra Presume, MD;  Location: [place];  Service: Plastics;  Laterality: N/A;    FAMILY HISTORY: family history includes Heart attack in his father and mother; Hypertension in his father.  SOCIAL HISTORY:  reports that he quit smoking about 10 months ago. His smoking use included cigarettes. He has a 52.00 pack-year smoking history. He has never used smokeless tobacco. He reports previous alcohol use. He reports that he does not use drugs.  ALLERGIES:  Penicillins  MEDICATIONS:  Current Outpatient Medications  Medication Sig Dispense Refill  . acetaminophen (TYLENOL) 500 MG tablet Take 500 mg by mouth every 6 (six) hours as needed for headache.    Marland Kitchen amLODipine (NORVASC) 10 MG tablet Take 10 mg by mouth every morning.     Marland Kitchen atorvastatin (LIPITOR) 80 MG tablet Take 80 mg by mouth every evening.    . hydrALAZINE (APRESOLINE) 10 MG tablet Take 1 tablet (10 mg total) by mouth 2 (two) times daily. 60 tablet 0  . HYDROcodone-acetaminophen (HYCET) 7.5-325 mg/15 ml solution Place 15 mLs into feeding tube every 6 (six) hours as needed for moderate pain. 250 mL 0  . ibuprofen (ADVIL) 200 MG tablet Take 200 mg by mouth every 6 (six) hours as needed for headache.    . quinapril (ACCUPRIL) 40 MG tablet Take 40 mg by mouth every morning.      No current facility-administered medications for this encounter.    REVIEW OF SYSTEMS:  Notable for that above.   PHYSICAL EXAM:  height is 5' 8.5" (1.74 m) and weight is 144 lb (65.3 kg). His temporal temperature is 97 F (36.1 C) (abnormal). His blood pressure is 115/62 and his pulse is 97. His respiration is 16 and oxygen saturation is 96%.   General: Alert and oriented, in no acute distress, presenting in a wheelchair.  He is nonverbal but alert; his wife provides the history HEENT: Head is normocephalic.  Oropharynx is notable for nonspecific coating on his tongue but no obvious thrush.  He declines tongue depressor due to a brisk gag reflex Neck: Neck is notable for bulky bilateral level 2 adenopathy.  Crusting of the skin at the anterior neck surgical site.  Trach in place Heart: Regular in rate and rhythm with no murmurs, rubs, or gallops. Chest: Clear to auscultation bilaterally, with no rhonchi, wheezes, or rales. Abdomen: Soft, nontender, nondistended, with no rigidity or guarding.  PEG tube in place Lymphatics: see Neck Exam Psychiatric: Judgment and insight are intact. Affect is appropriate.   ECOG  = 3  0 - Asymptomatic (Fully active, able to carry on all predisease activities without restriction)  1 - Symptomatic but completely ambulatory (Restricted in physically strenuous activity but ambulatory and able to carry out work of a light or sedentary nature. For example, light housework, office work)  2 - Symptomatic, <50% in bed during the day (Ambulatory and capable of all self care but unable to carry out any work activities. Up and about more than 50% of waking hours)  3 - Symptomatic, >50% in bed, but not bedbound (Capable of only limited self-care, confined to bed or chair 50% or more of waking hours)  4 - Bedbound (Completely disabled. Cannot carry on any self-care. Totally confined to bed or chair)  5 - Death   Eustace Pen MM, Creech RH, Tormey DC, et al. [phone]). "Toxicity and response criteria of the [facility]". Am. Lenna Sciara.  Clin. Oncol. 5 (6): 649-55   LABORATORY DATA:  Lab Results  Component Value Date   WBC 21.2 (H) 10/09/2020   HGB 9.6 (L) 10/09/2020   HCT 29.0 (L) 10/09/2020   MCV 93.9 10/09/2020   PLT 233 10/09/2020   CMP     Component Value Date/Time   NA 133 (L) 10/10/2020 0830   K 4.9 10/10/2020 0830   CL 100 10/10/2020 0830   CO2 25 10/10/2020 0830   GLUCOSE 145 (H) 10/10/2020 0830   BUN 22 11/17/2020 1212   CREATININE 0.79 11/17/2020 1212   CALCIUM 8.4 (L) 10/10/2020 0830   PROT 5.8 (L) 09/29/2020 0343   ALBUMIN 2.1 (L) 09/29/2020 0343   AST 19 09/29/2020 0343   ALT 35 09/29/2020 0343   ALKPHOS 81 09/29/2020 0343   BILITOT 0.5 09/29/2020 0343   GFRNONAA >60 11/17/2020 1212   GFRAA >60 09/18/2020 0136      Lab Results  Component Value Date   TSH 2.338 10/09/2020     RADIOGRAPHY: CT HEAD W & WO CONTRAST  Result Date: 11/01/2020 CLINICAL DATA:  Laryngeal cancer.  Headaches for 2 months. EXAM: CT HEAD WITHOUT AND WITH CONTRAST TECHNIQUE: Contiguous axial images were obtained from the base of the skull through the vertex without and  with intravenous contrast CONTRAST:  67mL ISOVUE-300 IOPAMIDOL (ISOVUE-300) INJECTION 61% COMPARISON:  Head CT 04/15/2019. FINDINGS: Brain: Mild generalized cerebral atrophy. There is no acute intracranial hemorrhage. No demarcated cortical infarct. No extra-axial fluid collection. No evidence of intracranial mass. No midline shift. Partially empty sella turcica. No abnormal intracranial enhancement. Vascular: No hyperdense vessel is identified on precontrast imaging. Atherosclerotic calcifications. Expected vascular enhancement. Skull: Normal. Negative for fracture or focal lesion. Sinuses/Orbits: Visualized orbits show no acute finding. No significant paranasal sinus disease at the imaged levels. IMPRESSION: No evidence of acute intracranial abnormality. Mild cerebral atrophy, stable as compared to the head CT of 04/15/2019. Electronically Signed   By: Kellie Simmering DO   On: 11/01/2020 09:22   CT SOFT TISSUE NECK W CONTRAST  Result Date: 11/01/2020 CLINICAL DATA:  There angio cancer. Pharyngo cutaneous fistula. History of external beam radiation therapy. EXAM: CT NECK WITH CONTRAST TECHNIQUE: Multidetector CT imaging of the neck was performed using the standard protocol following the bolus administration of intravenous contrast. CONTRAST:  29mL ISOVUE-300 IOPAMIDOL (ISOVUE-300) INJECTION 61% COMPARISON:  Neck CT 08/05/2020. 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 There is nonspecific diffuse mucosal/submucosal edema within the nasopharynx and oropharynx. New from the prior examination, there is 0.8 cm peripherally enhancing focus in the region of the right palatine tonsil which may reflect metachronous tumor or a necrotic retropharyngeal lymph node (series 5, image 31). Salivary glands: Hyperenhancement of the submandibular glands bilaterally, likely related to prior radiation  therapy. The parotid glands are unremarkable. Thyroid: The right thyroid lobe is poorly delineated. Unremarkable appearance of the left thyroid lobe. Lymph nodes: New from the prior neck CT of 08/05/2020, there is bulky bilateral necrotic level II/III lymphadenopathy. The dominant necrotic nodal mass on the right measures 4.3 x 3.2 cm in transaxial dimensions (series 5, image 45). The dominant necrotic nodal mass on the left measures 3.6 x 3.7 cm in transaxial dimensions (series 5, image 48) Vascular: Bulky bilateral level II/III lymphadenopathy completely effaces the internal jugular veins at this level. Suspected thrombus within the left internal jugular vein at the  level of the upper neck (for instance as seen on series 5, image 35). Atherosclerotic calcifications within the visualized aortic arch, proximal major branch vessels of the neck and carotid arteries. Limited intracranial: Intracranial contents better evaluated on concurrently performed head CT. Visualized orbits: Incompletely imaged. Visualized portions show no mass or acute finding. Mastoids and visualized paranasal sinuses: No significant paranasal sinus disease or mastoid effusion. Skeleton: Cervical spondylosis. No acute bony abnormality or aggressive osseous lesion. Upper chest: No consolidation within the imaged lung apices. Centrilobular and paraseptal emphysema. Other: There are scattered small foci of gas within the ventral soft tissues at the level of the neopharynx likely reflecting sinus tracts. Tracheostomy tube in place. These results will be called to the ordering clinician or representative by the Radiologist Assistant, and communication documented in the PACS or [employer]. 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 Nonspecific diffuse mucosal/submucosal edema within the nasopharynx and oropharynx. New 0.8 cm  peripherally enhancing focus in the region of the right palatine tonsil, which may reflect metachronous tumor or a necrotic retropharyngeal lymph node. Also new from the prior neck CT, there is bulky necrotic bilateral level 2/3 lymphadenopathy. The dominant nodal mass on the right measures 4.3 x 3.2 cm. The dominant nodal mass on the left measures 3.6 x 3.7 cm. Scattered small foci of gas within the ventral neck soft tissues at the level of the neopharynx, likely reflecting sinus tracts. No definite extension to the skin surface is identified, however, correlate with findings on physical exam. The bulky lymphadenopathy completely effaces the jugular veins. Suspected thrombus within the left internal jugular vein at the level of the upper neck. Electronically Signed   By: Kellie Simmering DO   On: 11/01/2020 09:16   NM PET Image Initial (PI) Skull Base To Thigh  Result Date: 11/15/2020 CLINICAL DATA:  Subsequent treatment strategy for laryngeal carcinoma post laryngectomy and closure of oral antral fistula. EXAM: NUCLEAR MEDICINE PET SKULL BASE TO THIGH TECHNIQUE: 7.88 mCi F-18 FDG was injected intravenously. Full-ring PET imaging was performed from the skull base to thigh after the radiotracer. CT data was obtained and used for attenuation correction and anatomic localization. Fasting blood glucose: 155 mg/dl COMPARISON:  CT neck 11/01/2020 and 08/05/2020, chest CT 08/05/2020 and abdominal CT 09/28/2020. FINDINGS: Mediastinal blood pool activity: SUV max 2.0 NECK: The recently demonstrated bulky bilateral level II/III cervical adenopathy is markedly hypermetabolic.Node on the right measuring approximately 5.5 x 4.3 cm transverse on image 46/3 has an SUV max 26.0. Node on the left measuring approximately 5.1 x 3.6 cm on image 45/3 has an SUV max of 18.4. Apart from these dominant hypermetabolic lymph nodes, no other hypermetabolic nodes are seen within the neck. No discrete lesions of the pharyngeal mucosal space  are seen. Specifically, no right tonsillar hypermetabolic activity is present. The bulky cervical adenopathy exerts mild mass effect on the pharynx. Incidental CT findings: Status post total laryngectomy and tracheostomy placement. Bilateral carotid atherosclerosis. CHEST: There are no hypermetabolic mediastinal, hilar or axillary lymph nodes. No hypermetabolic pulmonary activity or suspicious pulmonary nodularity. Left upper lobe nodules measuring up to 6 mm on image 98/3 are stable. The previously demonstrated subpleural nodule in the right lower lobe appears smaller (image 129/3). Incidental CT findings: Moderate centrilobular and paraseptal emphysema. Diffuse atherosclerosis of the aorta, great vessels and coronary arteries. Calcifications of the aortic valve. ABDOMEN/PELVIS: There is no hypermetabolic activity  within the liver, adrenal glands, spleen or pancreas. There is no hypermetabolic nodal activity. Incidental CT findings: Again demonstrated is a bilobed infrarenal abdominal aortic aneurysm which has a maximal transverse diameter of 3.7 cm proximally and 3.3 cm distally. Moderate stool throughout the colon. Sigmoid diverticulosis noted. SKELETON: Focal hypermetabolic activity within the left anterior chest wall, near the left 6th costochondral junction (SUV max 4.2). There is suspicion of a costal injury in this area (image 128/3). No chest wall mass. No other suspicious osseous activity. Incidental CT findings: none IMPRESSION: 1. The recently demonstrated bulky bilateral cervical adenopathy is intensely hypermetabolic consistent with metastatic disease. 2. No focal lesions of the pharyngeal mucosal space status post total laryngectomy. 3. No distant metastases. 4. Probable costal injury in the anterior chest wall at the left 6th costochondral junction. 5. Stable bilobed abdominal aortic aneurysm. Recommend follow-up ultrasound every 2 years. This recommendation follows ACR consensus guidelines: White  Paper of the ACR Incidental Findings Committee II on Vascular Findings. J Am Coll Radiol 2013; 10:789-794. Electronically Signed   By: Richardean Sale M.D.   On: 11/15/2020 16:41   DG ESOPHAGUS W SINGLE CM (SOL OR THIN BA)  Result Date: 11/10/2020 CLINICAL DATA:  Recurrent head neck cancer. EXAM: ESOPHOGRAM/BARIUM SWALLOW TECHNIQUE: Single contrast examination was performed using water-soluble contrast. FLUOROSCOPY TIME:  Fluoroscopy Time:  0 minutes and 18 seconds Radiation Exposure Index (if provided by the fluoroscopic device): 23 mGy Number of Acquired Spot Images: 8 COMPARISON:  Neck CT 11/01/2020 FINDINGS: Limited examination.  Patient had a hard time standing. Swallows of Omnipaque 300 do not demonstrate any evidence of aspiration. No leaking contrast was identified. No fistulas were identified. IMPRESSION: No aspiration or leakage of contrast material was demonstrated. Electronically Signed   By: Marijo Sanes M.D.   On: 11/10/2020 08:39      IMPRESSION/PLAN: Throat cancer, multiply recurrent, glottic primary  HEADACHES: Of note, his biggest complaint is severe headaches.  These were present before he developed bulky adenopathy.  That said, I cannot help but wonder if the headaches are in part related to vascular compression by the bulky level 2 lymph nodes.  There is internal jugular vein effacement on the CT scan that was performed of his neck on November 8.    When I compare the CT of his head in November to the CTA of his head in April he appears to have some slight hydrocephalus that has developed in the interim.  I have contacted radiology and neuro-oncology about this.    For his headaches, a good start would be for him to improve his hydration.  He appears mildly dehydrated according to his lab work today.  I spoke with the patient and his wife and methods to increase his water intake through his PEG tube and by mouth, whatever is easiest.   He also happens to be anemic which may be  contributing to his headaches.    I have refilled his hydrocodone with recommendations to take this every 6 hours as needed for symptoms  Dizziness: The cause of his dizziness may be intertwined with the cause of his headaches.  It is possible that his dizziness is also related to vascular compression by his bulky adenopathy.  His blood pressure has also been running low.  I will decrease his hydralazine dose from 25 mg twice a day to to 10 mg twice a day.  And, he will work on his hydration with his wife.  He has a history of  anemia and I have asked our patient navigator to order repeat lab work prior to his appointment next week with medical oncology.  We will also order a CMP panel for him.  He also has a suspected thrombus in the left internal jugular vein: I contacted medical oncology about the possible internal jugular vein thrombus.  He will be seen by medical oncology next week and be evaluated accordingly.  Constipation: Increase fluid intake ; recommend daily MiraLAX  Recurrent laryngeal cancer in the neck nodes: The adenopathy has developed rapidly.  I believe it is in his best interest for [place] to shrink down the adenopathy aggressively; the tumor board consensus was that his best chance of cure is concurrent chemoradiation therapy.  The patient understands there are inherent risks to radiation therapy as he has previously undergone radiation therapy for his glottic cancer.  There will be some overlap of his fields and this could potentially cause serious injury to nerves or vessels and in some cases it can result in fatal side effects.  That said, he is not a good candidate for surgical resection and surgical resection is unlikely to cure this.  He wants to be aggressive in his care.  We anticipate a 6 to 7-week course of radiation therapy and simulation will take place today, with treatment starting on December 6.  We discussed other potential risks, benefits, and side effects of  radiotherapy. We talked in detail about acute and late effects. We discussed that some of the most bothersome acute effects may be mucositis, dysgeusia, salivary changes, skin irritation, hair loss, dehydration, weight loss and fatigue. We talked about late effects which include but are not necessarily limited to dysphagia, hypothyroidism, nerve injury, vascular injury, spinal cord injury, xerostomia, trismus, neck edema, and potential injury to any of the tissues in the head and neck region. No guarantees of treatment were given. A consent form was signed and placed in the patient's medical record. The patient and his wife are enthusiastic about proceeding with treatment. I look forward to participating in the patient's care.    We also discussed that the treatment of head and neck cancer is a multidisciplinary process to maximize treatment outcomes and quality of life. For this reason the following referrals have been or will be made:   Medical oncology to discuss chemotherapy    Nutritionist for nutrition support during and after treatment.   Speech language pathology for swallowing and/or speech therapy.   Social work for social support.    Physical therapy due to risk of lymphedema in neck and deconditioning.   On date of service, in total, I spent 70 minutes on this encounter. Patient was seen in person.  __________________________________________   Eppie Gibson, MD  This document serves as a record of services personally performed by Eppie Gibson, MD. It was created on his behalf by Clerance Lav, a trained medical scribe. The creation of this record is based on the scribe's personal observations and the provider's statements to them. This document has been checked and approved by the attending provider.

## 2020-11-19 ENCOUNTER — Ambulatory Visit (HOSPITAL_COMMUNITY): Payer: Medicare Other

## 2020-11-19 ENCOUNTER — Encounter: Payer: Self-pay | Admitting: General Practice

## 2020-11-19 NOTE — Progress Notes (Signed)
[facility] Clinical Social Work  Initial Assessment   Marcus Collier is a 74 y.o. year old male assessed by phone. Clinical Social Work was referred by treatment team for assessment of psychosocial needs. Spoke w wife as he is unable to talk for himself.  SDOH (Social Determinants of Health) assessments performed: Yes   Distress Screen completed: No ONCBCN DISTRESS SCREENING 11/13/2014  Screening Type Other (comment)      Family/Social Information:  . Housing Arrangement: patient lives with wife . Family members/support persons in your life? Wife is very supportive, adult children are in touch w patient but not significantly involved . Transportation concerns: no . Employment: Retired.  . Income source: retirement income . Financial concerns: No o Type of concern: None . Food access concerns: He uses [employer], they have one box left and will need more.  Has 6 containers left, it is delivered. . Religious or spiritual practice: Used to attend [employer], do not go during pandemic. . Medication Concerns:none . Services Currently in place:  [facility] for nutrition supplies, [employer] for trach supplies, "they say they have [place] in their system and he has received supplies and they will send another box monthly"  Coping/ Adjustment to diagnosis: . Patient understands treatment plan and what happens next? 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  Will see medical oncologist next week.  Wife anticipates concurrent chemotherapy and radiation.   . Concerns about diagnosis and/or treatment: Pain or discomfort during procedures and significant pain and headaches, medical team cannot identify source of the pain which has been going on for "a couple of months at least, it seems to be getting worse, can have good/bad days" . Patient reported stressors: Physical issues and significant headaches which have not been responsive to pain medications, does not know where [employer] is gotten from "it  was delivered, I dont know where it comes from"  . Hopes and priorities: pain management, nutrition . Patient enjoys time with family/ friends and "he is doing nothing, he used to go out a lot, play cards, taking care of properties they own, was very active."  Says he is out of energy and has headaches . Current coping skills/ strengths: Supportive family/friends    SUMMARY: Current SDOH Barriers:  . Limited access to food . Social Isolation . wonders if "he will ever be able to eat again."  Interventions: . Discussed common feeling and emotions when being diagnosed with cancer, and the importance of support during treatment . Informed patient of the support team roles and support services at [facility] . Provided CSW contact information and encouraged patient to call with any questions or concerns Referred patient to dietitian , encouraged wife to reach out to [employer] to get more tube feeding supplies as they are running low  Follow Up Plan: Patient will contact CSW with any support or resource needs Patient verbalizes understanding of plan: Yes, wife    Beverely Pace , [place], [place] Worker Phone:  [phone]

## 2020-11-21 NOTE — Progress Notes (Signed)
[facility] CONSULT NOTE  Patient Care Team: [name], MD as PCP - General (Family Medicine)  CHIEF COMPLAINTS/PURPOSE OF CONSULTATION:  Recurrent  Laryngeal cancer.  HISTORY OF PRESENTING ILLNESS:  Marcus Collier 74 y.o. male is here because of newly diagnosed recurrent SCC glottis.  Mr Quale was initially seen in 2013 for glottic SCC s/p radiation directed at the vocal cords from 12/10/2012- 01/20/2013. He was recently seen by Dr Constance Holster for 2 month history of right ear pain, right neck pain, sore throat and worsening hoarseness. Soft tissue neck CT scan on 08/05/2020 showed likely post radiation changes in the hypopharynx and larynx. It also showed a possible superimposed recurrent malignancy at the level of the glottis and vestibule. There were no enlarged or necrotic lymph nodes. CT scan of chest on that same day showed multiple bilateral pulmonary nodules; the largest part solid nodule measured 8 mm in the right lower lobe. There was also a partially visualized proximal abdominal aortic aneurysm that measured up to 3.4 cm in anterior-posterior dimension.   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  Subsequently, the patient was seen in the ED on 09/14/2020 with chief compliant of shortness of breath. An awake total tracheostomy and total laryngectomy was performed at that time, which revealed invasive moderately to poorly differentiated squamous cell carcinoma with tumor invasion through the thyroid cartilage. Margins of resection were uninvolved.  Nodes were not removed.  The patient was admitted to the hospital and discharged home on 09/24/2020. 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 A gastrostomy tube was inserted on 09/28/2020. The patient was discharged home on 10/02/2020 but was then re-admitted on 10/06/2020 for surgical debridement and repair with pectoralis major myocutaneous flap that was performed on 10/08/2020. He was finally discharged home on 10/12/2020.   Most recent imaging imaging includes:  1. CT scan of head on 11/01/2020 for two-month history of headaches. Results showed mild but stable cerebral atrophy without evidence of acute intracranial abnormality.  2. Soft tissue neck CT scan on 11/01/2020 that showed non-specific diffuse mucosal/submucosal edema within the nasopharynx and oropharynx. There was a new 0.8 cm peripherally enhancing focus in the region of the right palatine tonsil, which may reflect metachronous tumor or a necrotic retropharyngeal lymph node. There was also noted to be new, bulky, necrotic bilateral level 2/3 lymphadenopathy. The dominant nodal mass on the right measured 4.3 x 3.2 cm and the dominant nodal mass on the left measured 3.6 x 3.7 cm. Additionally, there was scattered small foci of gas within the ventral neck soft tissues at the level of the neopharynx, likely reflecting sinus tracts. No definite extension to the skin surface was identified. Finally, there was bulky lymphadenopathy that completed effaced the jugular veins; suspected thrombus within the left internal jugular vein at the level of the upper neck.  3. PET scan on 11/15/2020 showed intensely hypermetabolic bulky bilateral cervical adenopathy that was consistent with metastatic disease. There was also noted to be a probable costal injury in the anterior chest wall at the left 6th costochondral junction and a stable bilobed abdominal aortic aneurysm. There were no focal lesions of the pharyngeal mucosal space status post total  laryngectomy, nor was there any distant metastatic disease.  He is only able to  swallow liquid diet. He is mainly dependent on the PEG tube for feeding. He was recently seen by my colleague Dr Isidore Moos and the plan was to consider concurrent chemo radiation. He is here with his wife to the appointment, he was a bit restless had to change position to keep the headaches. He continues to lose weight according to wife, lost about 40 lbs in total in 3 months. He can swallow some liquids, he uses PEG tube mostly and is in touch with nutritionist. Pain in term of headache, he describes it as all over the head, throbbing, increases with stress and light, some positional change can affect it.  He is using hydrocodone, pain is 10/10 at worse and becomes 5/10 in intensity with hydrocodone.  Chronic constipation. No other symptoms.  Rest of the pertinent 10 point ROS reviewed and negative  MEDICAL HISTORY:  Past Medical History:  Diagnosis Date  . Acute myocardial infarction ([place])   . Aortic stenosis    mild AS 09/13/20 echo [facility])  . 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 Family history of adverse reaction to anesthesia   . Headache    migraine  . Hoarseness of voice   . Hypertension   . 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 S/P radiation therapy 12/10/2012 - 01/20/2013   Vocal Cords/ 63 Gray/ 28 Fractions  . Unspecified malignant neoplasm of skin, site unspecified     SURGICAL HISTORY: Past Surgical History:  Procedure Laterality Date  . Biopsy of Vocal cord  11/15/12   Right and Left  . Biospy mouth  11/15/12  . CARDIAC CATHETERIZATION     with stents   . CLOSURE ORAL ANTRAL FISTULA N/A 10/08/2020   Procedure: CLOSURE ORAL ANTRAL FISTULA;  Surgeon: Izora Gala, MD;  Location: [place];  Service: ENT;  Laterality: N/A;  . COLONOSCOPY    . CORONARY ANGIOPLASTY    . DIRECT LARYNGOSCOPY N/A 08/11/2020   Procedure: DIRECT LARYNGOSCOPY WITH BIOPSY;  Surgeon: Izora Gala, MD;  Location: [place];  Service: ENT;  Laterality: N/A;  .  GASTROSTOMY TUBE PLACEMENT  09/28/2020  . IR GASTROSTOMY TUBE MOD SED  09/28/2020  . LARYNGETOMY N/A 09/14/2020   Procedure: LARYNGECTOMY Total;  Surgeon: Izora Gala, MD;  Location: [place];  Service: ENT;  Laterality: N/A;  NEEDS RNFA  . PECTORALIS FLAP N/A 10/08/2020   Procedure: PHARYNGEAL RECONSTRUCTION WITH PECTORALIS MAJOR MYOCUTANEOUS FLAP;  Surgeon: Cindra Presume, MD;  Location: [place];  Service: Plastics;  Laterality: N/A;  . SKIN SPLIT GRAFT N/A 10/08/2020   Procedure: SKIN GRAFT SPLIT THICKNESS;  Surgeon: Cindra Presume, MD;  Location: [place];  Service: Plastics;  Laterality: N/A;    SOCIAL HISTORY: Social History   Socioeconomic History  . Marital status: Married    Spouse name: Not on file  . Number of children: 2  . Years of education: Not on file  . Highest education level: Not on file  Occupational History  . Not on file  Tobacco Use  . Smoking status: Former Smoker    Packs/day: 1.00    Years: 52.00    Pack years: 52.00    Types: Cigarettes    Quit date: 08/2020    Years since quitting: 0.2  . Smokeless tobacco: Never Used  . Tobacco comment: stooped in 2003 started again and stopped 2021  Vaping Use  .  Vaping Use: Never used  Substance and Sexual Activity  . Alcohol use: Not Currently    Comment: social drinker  . Drug use: Never  . Sexual activity: Not Currently    Partners: Female  Other Topics Concern  . Not on file  Social History Narrative  . Not on file   Social Determinants of Health   Financial Resource Strain: Low Risk   . Difficulty of Paying Living Expenses: Not hard at all  Food Insecurity: No Food Insecurity  . Worried About Charity fundraiser in the Last Year: Never true  . Ran Out of Food in the Last Year: Never true  Transportation Needs: No Transportation Needs  . Lack of Transportation (Medical): No  . Lack of Transportation (Non-Medical): No  Physical Activity:   . Days of Exercise per Week: Not on file  . Minutes of  Exercise per Session: Not on file  Stress: No Stress Concern Present  . Feeling of Stress : Only a little  Social Connections: Socially Integrated  . Frequency of Communication with Friends and Family: Twice a week  . Frequency of Social Gatherings with Friends and Family: Once a week  . Attends Religious Services: More than 4 times per year  . Active Member of Clubs or Organizations: Yes  . Attends Archivist Meetings: More than 4 times per year  . Marital Status: Married  Human resources officer Violence:   . Fear of Current or Ex-Partner: Not on file  . Emotionally Abused: Not on file  . Physically Abused: Not on file  . Sexually Abused: Not on file    FAMILY HISTORY: Family History  Problem Relation Age of Onset  . Heart attack Mother   . Heart attack Father   . Hypertension Father     ALLERGIES:  is allergic to penicillins.  MEDICATIONS:  Current Outpatient Medications  Medication Sig Dispense Refill  . acetaminophen (TYLENOL) 500 MG tablet Take 500 mg by mouth every 6 (six) hours as needed for headache.    Marland Kitchen amLODipine (NORVASC) 10 MG tablet Take 10 mg by mouth every morning.     Marland Kitchen atorvastatin (LIPITOR) 80 MG tablet Take 80 mg by mouth every evening.    . hydrALAZINE (APRESOLINE) 10 MG tablet Take 1 tablet (10 mg total) by mouth 2 (two) times daily. 60 tablet 0  . HYDROcodone-acetaminophen (HYCET) 7.5-325 mg/15 ml solution Place 15 mLs into feeding tube every 6 (six) hours as needed for moderate pain. 250 mL 0  . ibuprofen (ADVIL) 200 MG tablet Take 200 mg by mouth every 6 (six) hours as needed for headache.    . quinapril (ACCUPRIL) 40 MG tablet Take 40 mg by mouth every morning.      No current facility-administered medications for this visit.     PHYSICAL EXAMINATION: ECOG PERFORMANCE STATUS: 1 - Symptomatic but completely ambulatory Border line 2.  Vitals:   11/24/20 1047  BP: 96/73  Temp: 98.7 F (37.1 C)   Filed Weights   11/24/20 1047  Weight:  142 lb 11.2 oz (64.7 kg)    GENERAL: alert, no acute distress but restless. SKIN: skin color, texture, turgor are normal, no rashes or significant lesions, Trach site clean EYES: normal, pupil response sluggish on the left. OROPHARYNX: no exudate, no erythema and lips, buccal mucosa, and tongue normal. Poor dentition NECK: supple, thyroid normal size, non-tender,  LYMPH: Bilateral cervical lymphadenopathy, large masses.  LUNGS: clear to auscultation and percussion with normal breathing effort HEART: regular  rate & rhythm and no murmurs and no lower extremity edema ABDOMEN:abdomen soft, non-tender and normal bowel sounds Musculoskeletal:no cyanosis of digits and no clubbing  PSYCH: alert & oriented x 3 with fluent speech NEURO: no focal motor/sensory deficits  LABORATORY DATA:  I have reviewed the data as listed Lab Results  Component Value Date   WBC 39.1 (H) 11/24/2020   HGB 10.7 (L) 11/24/2020   HCT 31.4 (L) 11/24/2020   MCV 89.0 11/24/2020   PLT 407 (H) 11/24/2020   Lab Results  Component Value Date   NA 133 (L) 10/10/2020   K 4.9 10/10/2020   CL 100 10/10/2020   CO2 25 10/10/2020    RADIOGRAPHIC STUDIES: I have personally reviewed the radiological images as listed and agreed with the findings in the report. CT HEAD W & WO CONTRAST  Result Date: 11/01/2020 CLINICAL DATA:  Laryngeal cancer.  Headaches for 2 months. EXAM: CT HEAD WITHOUT AND WITH CONTRAST TECHNIQUE: Contiguous axial images were obtained from the base of the skull through the vertex without and with intravenous contrast CONTRAST:  81mL ISOVUE-300 IOPAMIDOL (ISOVUE-300) INJECTION 61% COMPARISON:  Head CT 04/15/2019. FINDINGS: Brain: Mild generalized cerebral atrophy. There is no acute intracranial hemorrhage. No demarcated cortical infarct. No extra-axial fluid collection. No evidence of intracranial mass. No midline shift. Partially empty sella turcica. No abnormal intracranial enhancement. Vascular: No  hyperdense vessel is identified on precontrast imaging. Atherosclerotic calcifications. Expected vascular enhancement. Skull: Normal. Negative for fracture or focal lesion. Sinuses/Orbits: Visualized orbits show no acute finding. No significant paranasal sinus disease at the imaged levels. IMPRESSION: No evidence of acute intracranial abnormality. Mild cerebral atrophy, stable as compared to the head CT of 04/15/2019. Electronically Signed   By: Kellie Simmering DO   On: 11/01/2020 09:22   CT SOFT TISSUE NECK W CONTRAST  Result Date: 11/01/2020 CLINICAL DATA:  There angio cancer. Pharyngo cutaneous fistula. History of external beam radiation therapy. EXAM: CT NECK WITH CONTRAST TECHNIQUE: Multidetector CT imaging of the neck was performed using the standard protocol following the bolus administration of intravenous contrast. CONTRAST:  40mL ISOVUE-300 IOPAMIDOL (ISOVUE-300) INJECTION 61% COMPARISON:  Neck CT 08/05/2020. 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 There is nonspecific diffuse mucosal/submucosal edema within the nasopharynx and oropharynx. New from the prior examination, there is 0.8 cm peripherally enhancing focus in the region of the right palatine tonsil which may reflect metachronous tumor or a necrotic retropharyngeal lymph node (series 5, image 31). Salivary glands: Hyperenhancement of the submandibular glands bilaterally, likely related to prior radiation therapy. The parotid glands are unremarkable. Thyroid: The right thyroid lobe is poorly delineated. Unremarkable appearance of the left thyroid lobe. Lymph nodes: New from the prior neck CT of 08/05/2020, there is bulky bilateral necrotic level II/III lymphadenopathy. The dominant necrotic nodal mass on the right measures 4.3 x 3.2 cm in transaxial dimensions (series 5, image 45). The dominant necrotic nodal mass  on the left measures 3.6 x 3.7 cm in transaxial dimensions (series 5, image 48) Vascular: Bulky bilateral level II/III lymphadenopathy completely effaces the internal jugular veins at this level. Suspected thrombus within the left internal jugular vein at the level of the upper neck (for instance as seen on series 5, image 35). Atherosclerotic calcifications within the visualized aortic arch, proximal major branch vessels of the neck and carotid arteries. Limited intracranial: Intracranial contents better evaluated on concurrently  performed head CT. Visualized orbits: Incompletely imaged. Visualized portions show no mass or acute finding. Mastoids and visualized paranasal sinuses: No significant paranasal sinus disease or mastoid effusion. Skeleton: Cervical spondylosis. No acute bony abnormality or aggressive osseous lesion. Upper chest: No consolidation within the imaged lung apices. Centrilobular and paraseptal emphysema. Other: There are scattered small foci of gas within the ventral soft tissues at the level of the neopharynx likely reflecting sinus tracts. Tracheostomy tube in place. These results will be called to the ordering clinician or representative by the Radiologist Assistant, and communication documented in the PACS or [employer]. 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 Nonspecific diffuse mucosal/submucosal edema within the nasopharynx and oropharynx. New 0.8 cm peripherally enhancing focus in the region of the right palatine tonsil, which may reflect metachronous tumor or a necrotic retropharyngeal lymph node. Also new from the prior neck CT, there is bulky necrotic bilateral level 2/3 lymphadenopathy. The dominant nodal mass on the right measures 4.3 x 3.2 cm. The dominant nodal mass on the left measures 3.6 x 3.7 cm. Scattered small foci of gas within the ventral neck  soft tissues at the level of the neopharynx, likely reflecting sinus tracts. No definite extension to the skin surface is identified, however, correlate with findings on physical exam. The bulky lymphadenopathy completely effaces the jugular veins. Suspected thrombus within the left internal jugular vein at the level of the upper neck. Electronically Signed   By: Kellie Simmering DO   On: 11/01/2020 09:16   NM PET Image Initial (PI) Skull Base To Thigh  Result Date: 11/15/2020 CLINICAL DATA:  Subsequent treatment strategy for laryngeal carcinoma post laryngectomy and closure of oral antral fistula. EXAM: NUCLEAR MEDICINE PET SKULL BASE TO THIGH TECHNIQUE: 7.88 mCi F-18 FDG was injected intravenously. Full-ring PET imaging was performed from the skull base to thigh after the radiotracer. CT data was obtained and used for attenuation correction and anatomic localization. Fasting blood glucose: 155 mg/dl COMPARISON:  CT neck 11/01/2020 and 08/05/2020, chest CT 08/05/2020 and abdominal CT 09/28/2020. FINDINGS: Mediastinal blood pool activity: SUV max 2.0 NECK: The recently demonstrated bulky bilateral level II/III cervical adenopathy is markedly hypermetabolic.Node on the right measuring approximately 5.5 x 4.3 cm transverse on image 46/3 has an SUV max 26.0. Node on the left measuring approximately 5.1 x 3.6 cm on image 45/3 has an SUV max of 18.4. Apart from these dominant hypermetabolic lymph nodes, no other hypermetabolic nodes are seen within the neck. No discrete lesions of the pharyngeal mucosal space are seen. Specifically, no right tonsillar hypermetabolic activity is present. The bulky cervical adenopathy exerts mild mass effect on the pharynx. Incidental CT findings: Status post total laryngectomy and tracheostomy placement. Bilateral carotid atherosclerosis. CHEST: There are no hypermetabolic mediastinal, hilar or axillary lymph nodes. No hypermetabolic pulmonary activity or suspicious pulmonary nodularity.  Left upper lobe nodules measuring up to 6 mm on image 98/3 are stable. The previously demonstrated subpleural nodule in the right lower lobe appears smaller (image 129/3). Incidental CT findings: Moderate centrilobular and paraseptal emphysema. Diffuse atherosclerosis of the aorta, great vessels and coronary arteries. Calcifications of the aortic valve. ABDOMEN/PELVIS: There is no hypermetabolic activity within the liver, adrenal glands, spleen or pancreas. There is no hypermetabolic nodal activity. Incidental CT findings: Again demonstrated is a bilobed infrarenal abdominal aortic aneurysm which has a maximal transverse diameter of 3.7 cm proximally and 3.3 cm  distally. Moderate stool throughout the colon. Sigmoid diverticulosis noted. SKELETON: Focal hypermetabolic activity within the left anterior chest wall, near the left 6th costochondral junction (SUV max 4.2). There is suspicion of a costal injury in this area (image 128/3). No chest wall mass. No other suspicious osseous activity. Incidental CT findings: none IMPRESSION: 1. The recently demonstrated bulky bilateral cervical adenopathy is intensely hypermetabolic consistent with metastatic disease. 2. No focal lesions of the pharyngeal mucosal space status post total laryngectomy. 3. No distant metastases. 4. Probable costal injury in the anterior chest wall at the left 6th costochondral junction. 5. Stable bilobed abdominal aortic aneurysm. Recommend follow-up ultrasound every 2 years. This recommendation follows ACR consensus guidelines: White Paper of the ACR Incidental Findings Committee II on Vascular Findings. J Am Coll Radiol 2013; 10:789-794. Electronically Signed   By: Richardean Sale M.D.   On: 11/15/2020 16:41   DG ESOPHAGUS W SINGLE CM (SOL OR THIN BA)  Result Date: 11/10/2020 CLINICAL DATA:  Recurrent head neck cancer. EXAM: ESOPHOGRAM/BARIUM SWALLOW TECHNIQUE: Single contrast examination was performed using water-soluble contrast.  FLUOROSCOPY TIME:  Fluoroscopy Time:  0 minutes and 18 seconds Radiation Exposure Index (if provided by the fluoroscopic device): 23 mGy Number of Acquired Spot Images: 8 COMPARISON:  Neck CT 11/01/2020 FINDINGS: Limited examination.  Patient had a hard time standing. Swallows of Omnipaque 300 do not demonstrate any evidence of aspiration. No leaking contrast was identified. No fistulas were identified. IMPRESSION: No aspiration or leakage of contrast material was demonstrated. Electronically Signed   By: Marijo Sanes M.D.   On: 11/10/2020 08:39   I have reviewed pertinent images.  ASSESSMENT:    This is a 74 yr old male patient who was initially seen in 2013 for glottic SCC s/p radiation directed at the vocal cords from 12/10/2012- 01/20/2013. He was recently seen by Dr Constance Holster for 2 month history of right ear pain, right neck pain, sore throat and worsening hoarseness. Soft tissue neck CT scan on 08/05/2020 showed likely post radiation changes in the hypopharynx and larynx. It also showed a possible superimposed recurrent malignancy at the level of the glottis and vestibule. There were no enlarged or necrotic lymph nodes. CT scan of chest on that same day showed multiple bilateral pulmonary nodules; the largest part solid nodule measured 8 mm in the right lower lobe. There was also a partially visualized proximal abdominal aortic aneurysm that measured up to 3.4 cm in anterior-posterior dimension.   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 Subsequently, the patient was seen in the ED on 09/14/2020 with chief compliant of shortness of breath. An awake total tracheostomy and total laryngectomy was performed at that time, which revealed invasive moderately to poorly differentiated squamous cell carcinoma  with tumor invasion through the thyroid cartilage. Margins of resection were uninvolved.  Nodes were not removed.  The patient was admitted to the hospital and discharged home on 09/24/2020. 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 A gastrostomy tube was inserted on 09/28/2020. The patient was discharged home on 10/02/2020 but was then re-admitted on 10/06/2020 for surgical debridement and repair with pectoralis major myocutaneous flap that was performed on 10/08/2020. He was finally discharged  home on 10/12/2020.   He recently had a PET scan which showed bulky bilateral cervical adenopathy which is intensely hypermetabolic consistent with metastatic disease. No focal lesions of the pharyngeal mucosal space s/p total laryngectomy. No distant metastasis. Probable costal injury in the anterior chest wall at the left 6 th costochondral junction. He is now referred back to medical oncology for consideration of concurrent chemoradiation.   PLAN:   Given recurrent disease without any definitive evidence of distant metastasis, we have discussed about concurrent chemoradiation with cisplatin.  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  Given his PS, worsening fatigue and weight loss, I believe he is better off with weekly cisplatin vs q. 21 days cisplatin.  He however was quite restless during the conversation, he wasn't quite enthused about the idea of chemotherapy and wrote on a paper that he would like to think about it , although wife was hoping we can move forward with it quickly. We havent dwelled into the side effects of chemotherapy today since patient was restless and wanted to leave. If he decided to proceed with concurrent chemo radiation, Informed consent needs to be  obtained. With regards to headaches, I reviewed the CT imaging, , IJ effacement on the left from bulky lymphadenopathy noted with no clear evidence of thrombus, although small thrombus cant be excluded. At this time, I don't believe that his anemia or his IJ ? Thrombus could cause his headaches. It could all be from the disease. I recommend doing MR brain and also encouraged the idea of seeing Dr Mickeal Skinner, like suggested by Radiation oncology team. I however think MR can be challenging because he is restless at baseline with the headache. I will run it by Dr Mickeal Skinner. He can use hydrocodone as needed in the interim, not needing much daily. Although I initially thought of considering blood thinners, given the questionable thrombus, worsening PS and increased risk of bleeding, I don't believe there will be much benefit from the blood thinners. He was encouraged to let us know as soon as possible about his decision. He and his wife were told that although intent of treatment is curative, we cant guarantee a cure in this situation, They understand this is their best chance at the cancer. He will follow with nutrition, speech pathology, radiation oncology as recommended.  No orders of the defined types were placed in this encounter.  All questions were answered. The patient knows to call the clinic with any problems, questions or concerns. I spent 75 minutes in the care of this patient, review of his medical records, H and P, counseling, discussion with NN and radiation oncology    Benay Pike, MD 11/24/20 12:12 PM

## 2020-11-23 ENCOUNTER — Ambulatory Visit: Payer: Medicare Other

## 2020-11-23 ENCOUNTER — Ambulatory Visit: Payer: Medicare Other | Admitting: Radiation Oncology

## 2020-11-23 DIAGNOSIS — Z510 Encounter for antineoplastic radiation therapy: Secondary | ICD-10-CM | POA: Diagnosis not present

## 2020-11-24 ENCOUNTER — Encounter: Payer: Self-pay | Admitting: Hematology and Oncology

## 2020-11-24 ENCOUNTER — Inpatient Hospital Stay: Payer: Medicare Other

## 2020-11-24 ENCOUNTER — Other Ambulatory Visit: Payer: Self-pay

## 2020-11-24 ENCOUNTER — Inpatient Hospital Stay: Payer: Medicare Other | Attending: Hematology and Oncology | Admitting: Hematology and Oncology

## 2020-11-24 VITALS — BP 96/73 | Temp 98.7°F | Ht 68.5 in | Wt 142.7 lb

## 2020-11-24 DIAGNOSIS — C320 Malignant neoplasm of glottis: Secondary | ICD-10-CM | POA: Diagnosis not present

## 2020-11-24 DIAGNOSIS — Z85828 Personal history of other malignant neoplasm of skin: Secondary | ICD-10-CM | POA: Insufficient documentation

## 2020-11-24 DIAGNOSIS — R918 Other nonspecific abnormal finding of lung field: Secondary | ICD-10-CM | POA: Diagnosis not present

## 2020-11-24 DIAGNOSIS — R739 Hyperglycemia, unspecified: Secondary | ICD-10-CM | POA: Insufficient documentation

## 2020-11-24 DIAGNOSIS — I252 Old myocardial infarction: Secondary | ICD-10-CM | POA: Diagnosis not present

## 2020-11-24 DIAGNOSIS — D649 Anemia, unspecified: Secondary | ICD-10-CM | POA: Diagnosis not present

## 2020-11-24 DIAGNOSIS — K5909 Other constipation: Secondary | ICD-10-CM | POA: Insufficient documentation

## 2020-11-24 DIAGNOSIS — I350 Nonrheumatic aortic (valve) stenosis: Secondary | ICD-10-CM | POA: Insufficient documentation

## 2020-11-24 DIAGNOSIS — Z79899 Other long term (current) drug therapy: Secondary | ICD-10-CM | POA: Diagnosis not present

## 2020-11-24 DIAGNOSIS — I10 Essential (primary) hypertension: Secondary | ICD-10-CM | POA: Insufficient documentation

## 2020-11-24 DIAGNOSIS — Z8521 Personal history of malignant neoplasm of larynx: Secondary | ICD-10-CM | POA: Insufficient documentation

## 2020-11-24 DIAGNOSIS — Z8249 Family history of ischemic heart disease and other diseases of the circulatory system: Secondary | ICD-10-CM | POA: Insufficient documentation

## 2020-11-24 DIAGNOSIS — Z87891 Personal history of nicotine dependence: Secondary | ICD-10-CM | POA: Diagnosis not present

## 2020-11-24 DIAGNOSIS — Z923 Personal history of irradiation: Secondary | ICD-10-CM | POA: Insufficient documentation

## 2020-11-24 DIAGNOSIS — I714 Abdominal aortic aneurysm, without rupture: Secondary | ICD-10-CM | POA: Diagnosis not present

## 2020-11-24 DIAGNOSIS — Z5111 Encounter for antineoplastic chemotherapy: Secondary | ICD-10-CM | POA: Diagnosis present

## 2020-11-24 LAB — CBC WITH DIFFERENTIAL/PLATELET
Abs Immature Granulocytes: 0.6 10*3/uL — ABNORMAL HIGH (ref 0.00–0.07)
Basophils Absolute: 0.1 10*3/uL (ref 0.0–0.1)
Basophils Relative: 0 %
Eosinophils Absolute: 0 10*3/uL (ref 0.0–0.5)
Eosinophils Relative: 0 %
HCT: 31.4 % — ABNORMAL LOW (ref 39.0–52.0)
Hemoglobin: 10.7 g/dL — ABNORMAL LOW (ref 13.0–17.0)
Immature Granulocytes: 2 %
Lymphocytes Relative: 3 %
Lymphs Abs: 1.2 10*3/uL (ref 0.7–4.0)
MCH: 30.3 pg (ref 26.0–34.0)
MCHC: 34.1 g/dL (ref 30.0–36.0)
MCV: 89 fL (ref 80.0–100.0)
Monocytes Absolute: 2.3 10*3/uL — ABNORMAL HIGH (ref 0.1–1.0)
Monocytes Relative: 6 %
Neutro Abs: 34.9 10*3/uL — ABNORMAL HIGH (ref 1.7–7.7)
Neutrophils Relative %: 89 %
Platelets: 407 10*3/uL — ABNORMAL HIGH (ref 150–400)
RBC: 3.53 MIL/uL — ABNORMAL LOW (ref 4.22–5.81)
RDW: 14 % (ref 11.5–15.5)
WBC: 39.1 10*3/uL — ABNORMAL HIGH (ref 4.0–10.5)
nRBC: 0 % (ref 0.0–0.2)

## 2020-11-24 LAB — IRON AND TIBC
Iron: 16 ug/dL — ABNORMAL LOW (ref 42–163)
Saturation Ratios: 9 % — ABNORMAL LOW (ref 20–55)
TIBC: 183 ug/dL — ABNORMAL LOW (ref 202–409)
UIBC: 167 ug/dL (ref 117–376)

## 2020-11-24 LAB — VITAMIN B12: Vitamin B-12: 661 pg/mL (ref 180–914)

## 2020-11-24 LAB — FERRITIN: Ferritin: 867 ng/mL — ABNORMAL HIGH (ref 24–336)

## 2020-11-24 LAB — FOLATE: Folate: 11.7 ng/mL (ref >5.9)

## 2020-11-24 NOTE — Progress Notes (Signed)
Oncology Nurse Navigator Documentation  Met with patient during initial consult with Dr. Chryl Heck. He was accompanied by is wife Diane. . Further introduced myself as his/their Navigator, explained my role as a member of the Care Team. . Assisted with post-consult appt scheduling. . They verbalized understanding of information provided. He will be here for Head and Neck MDC tomorrow. I will discuss his decision for treatment with his wife and him and inform Dr. Chryl Heck of the decision. I encouraged them to call with questions/concerns moving forward.  Harlow Asa, RN, BSN, OCN Head & Neck Oncology Nurse [place] at [facility] [phone]

## 2020-11-25 ENCOUNTER — Ambulatory Visit: Payer: Medicare Other | Admitting: Physical Therapy

## 2020-11-25 ENCOUNTER — Encounter: Payer: Self-pay | Admitting: Physical Therapy

## 2020-11-25 ENCOUNTER — Encounter: Payer: Self-pay | Admitting: Hematology and Oncology

## 2020-11-25 ENCOUNTER — Other Ambulatory Visit: Payer: Self-pay | Admitting: Hematology and Oncology

## 2020-11-25 ENCOUNTER — Other Ambulatory Visit: Payer: Self-pay

## 2020-11-25 ENCOUNTER — Encounter: Payer: Self-pay | Admitting: General Practice

## 2020-11-25 ENCOUNTER — Ambulatory Visit: Payer: Medicare Other | Attending: Radiation Oncology

## 2020-11-25 DIAGNOSIS — R293 Abnormal posture: Secondary | ICD-10-CM | POA: Insufficient documentation

## 2020-11-25 DIAGNOSIS — R1310 Dysphagia, unspecified: Secondary | ICD-10-CM | POA: Diagnosis not present

## 2020-11-25 DIAGNOSIS — M6281 Muscle weakness (generalized): Secondary | ICD-10-CM | POA: Diagnosis present

## 2020-11-25 DIAGNOSIS — C328 Malignant neoplasm of overlapping sites of larynx: Secondary | ICD-10-CM | POA: Diagnosis present

## 2020-11-25 DIAGNOSIS — R262 Difficulty in walking, not elsewhere classified: Secondary | ICD-10-CM

## 2020-11-25 NOTE — Progress Notes (Signed)
Oncology Nurse Navigator Documentation  Mr. Marcus Collier presented for head and neck MDC today with his wife. He saw Marcus Collier SLP and Marcus Collier as scheduled. I discussed his decision regarding chemotherapy treatment. He has decided to receive chemotherapy per Dr. Rob Collier recommendation after discussing it with his wife at home yesterday. I have notified Dr. Chryl Collier and she spoke with the patient's wife about the decision as well. Dr. Chryl Collier would like for Mr. Marcus Collier to have a hearing test before receiving chemotherapy and I have been able to get that scheduled for 12/3 at 12:20 at [facility]. I have notified his wife of the appointment and she is agreeable to the date and time. They know to call me if they have any further questions or concerns.   Harlow Asa RN, BSN, OCN Head & Neck Oncology Nurse [place] at [facility] Phone # [phone]  Fax # [phone]

## 2020-11-25 NOTE — Progress Notes (Signed)
I called the patient, wife answered. Patient apparently is resting, and he cant talk. Wife however noted all the discussion and promised to relay everything to him. I discussed about side effects of chemotherapy including but not limited to fatigue, nausea, vomiting, cytopenias, ototoxicity, nephrotoxicity and increased risk of infections, and very rarely dying from these adverse effects.  I discussed hearing loss could be worse and permanent with cisplatin, hence although there is not lot of data carboplatin weekly has been used as an alternative in patient who are not candidates for cisplatin or cetuximab. Although carboplatin with 5 FU would be ideal alternative, I dont believe he can tolerate combination chemotherapy. Discussed with Dr Isidore Moos as well, she is agreeable to weekly carboplatin He is yet to sign the consent. We will also arrange for audiology referral. Discussed plan of care with  [facility] our NN.

## 2020-11-25 NOTE — Therapy (Signed)
[facility] [address] [place], [place], [address] Phone: [phone]   Fax:  [phone]  Speech Language Pathology Evaluation  Patient Details  Name: Marcus Collier MRN: [id number] Date of Birth: 12-05-1946 No data recorded  Encounter Date: 11/25/2020   End of Session - 11/25/20 1220    Visit Number 1    Number of Visits 7    Date for SLP Re-Evaluation 02/23/21   90 days   SLP Start Time 0940    SLP Stop Time  1025    SLP Time Calculation (min) 45 min    Activity Tolerance Patient tolerated treatment well           Past Medical History:  Diagnosis Date  . Acute myocardial infarction ([place])   . Aortic stenosis    mild AS 09/13/20 echo [facility])  . 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 Family history of adverse reaction to anesthesia   . Headache    migraine  . Hoarseness of voice   . Hypertension   . 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 S/P radiation therapy 12/10/2012 - 01/20/2013   Vocal Cords/ 63 Gray/ 28 Fractions  . Unspecified malignant neoplasm of skin, site unspecified     Past Surgical History:  Procedure Laterality Date  . Biopsy of Vocal cord  11/15/12   Right and Left  . Biospy mouth  11/15/12  . CARDIAC CATHETERIZATION     with stents   . CLOSURE ORAL ANTRAL FISTULA N/A 10/08/2020   Procedure: CLOSURE ORAL ANTRAL FISTULA;  Surgeon: Izora Gala, MD;  Location: [facility];  Service: ENT;  Laterality: N/A;  . COLONOSCOPY    . CORONARY ANGIOPLASTY    . DIRECT LARYNGOSCOPY N/A 08/11/2020   Procedure: DIRECT LARYNGOSCOPY WITH BIOPSY;  Surgeon: Izora Gala, MD;  Location: [facility];  Service: ENT;  Laterality: N/A;  . GASTROSTOMY TUBE PLACEMENT  09/28/2020  . IR GASTROSTOMY TUBE MOD SED  09/28/2020  . LARYNGETOMY N/A 09/14/2020   Procedure: LARYNGECTOMY Total;  Surgeon: Izora Gala, MD;  Location: [facility];  Service: ENT;  Laterality: N/A;  NEEDS RNFA  .  PECTORALIS FLAP N/A 10/08/2020   Procedure: PHARYNGEAL RECONSTRUCTION WITH PECTORALIS MAJOR MYOCUTANEOUS FLAP;  Surgeon: Cindra Presume, MD;  Location: [facility];  Service: Plastics;  Laterality: N/A;  . SKIN SPLIT GRAFT N/A 10/08/2020   Procedure: SKIN GRAFT SPLIT THICKNESS;  Surgeon: Cindra Presume, MD;  Location: [facility];  Service: Plastics;  Laterality: N/A;    There were no vitals filed for this visit.   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 Complications wifh fistula - repaired on 10-08-20. Esophagram two weeks ago ID'd no evidence of remaining fistula.    Patient is accompained by: Family member   wife             SLP Evaluation [facility] - 11/25/20 1024      SLP Visit Information   SLP Received On 11/25/20      Subjective   Patient/Family Stated Goal Pt shrugged shoulders      Pain Assessment   Currently in Pain? 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 Presented Dr. Constance Holster 07-27-20 rt ear and rt neck pain and worsening hoarseness for two months. 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 09-14-20 ED with dyspnea - trach and total laryngectomy were performed and on 09-27-20 pt was readimitted due t pharyngocutaneous fistula, PEG placed 09-28-20, and repair of the fistula repair was done on 10-06-20. On 11-01-20 new lymphadenopathy was ID'd and pt decided to undergo rad tx. Cheotherapy is an option at this time but pt and wife are weighing options.       Prior Functional Status   Cognitive/Linguistic Baseline Within functional limits      Cognition   Overall Cognitive Status Within Functional Limits for tasks assessed      Auditory Comprehension   Overall Auditory Comprehension Appears within functional limits for tasks assessed      Verbal Expression   Overall Verbal  Expression Appears within functional limits for tasks assessed      Written Expression   Dominant Hand Right    Written Expression Within Functional Limits      Oral Motor/Sensory Function   Overall Oral Motor/Sensory Function Appears within functional limits for tasks assessed      Motor Speech   Overall Motor Speech Impaired at baseline    Phonation Aphonic   due to laryngectomy- 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 Pt receives his dietary needs via PEG tube. Because pt stated he thinks his swallowing was better last week, and he reports small amounts of coffee resulted in WFL/WNL pharyngeal clearance this morning, but during evaluation today pt had to expectorate each of 2 water sips, a modified barium swallow exam is recommended.   Because data states the risk for dysphagia during and after radiation treatment is high due to undergoing radiation tx, SLP taught pt about the possibility of reduced/limited ability for PO intake during rad tx. SLP encouraged pt to continue swallowing POs as far into rad tx as possible, even ingesting POs and/or completing HEP shortly after administration of pain meds. Among other modifications for days when pt cannot functionally swallow, SLP talked about performing only non-swallowing tasks on the handout/HEP, and then adding swallowing tasks back in when it becomes possible to do so.  SLP educated pt re: changes to swallowing musculature after rad tx, and why adherence to dysphagia HEP provided today and PO consumption was necessary to inhibit muscle fibrosis following rad tx. Pt demonstrated understanding of these things to SLP.    SLP then developed a HEP for pt and pt was instructed how to perform exercises involving lingual, vocal, and pharyngeal strengthening. SLP performed each exercise and pt  return demonstrated each exercise. SLP ensured pt performance was correct prior to moving on to next exercise. Pt was instructed to complete this program 2 times a day, 6-7 days/week until 6 months after his last rad tx, then x2 a week after that.                  SLP Education - 11/25/20 1218    Education Details recommending modified - Dr Isidore Moos to confirm necessity, try to cont to drink liquids at home, strange that coffee this morning was OK adn water was not just a few hours later, HEP procedure, late effects head/neck radiation on swallow function, might be beneficial to carry a white board/marker  Person(s) Educated Patient;Spouse    Methods Explanation;Demonstration;Verbal cues;Handout    Comprehension Verbalized understanding;Returned demonstration            SLP Short Term Goals - 11/25/20 1229      SLP SHORT TERM GOAL #1   Title pt will tell SLP why pt is completing HEP with modified independence    Time 1    Period --   sessions, for all STGs   Status New      SLP SHORT TERM GOAL #2   Title pt will complete HEP with occasional min-mod A    Time 2    Period --   sessions   Status New      SLP SHORT TERM GOAL #3   Title pt will follow any postural, behavioral, or dietary suggestions fromany  objective swallow assessments with rare min A    Time 2    Status New            SLP Long Term Goals - 11/25/20 1232      SLP LONG TERM GOAL #1   Title pt will follow any recommendations form objective swallow assessments with rare min A    Time 4    Period --   sessions, for all LTGs   Status New      SLP LONG TERM GOAL #2   Title pt will complete HEP with rare min A in 2 sessions    Time 3    Status New      SLP LONG TERM GOAL #3   Title pt will tell SLP how a food journal could hasten return to a more normalized diet    Time 3    Status New      SLP LONG TERM GOAL #4   Title pt will describe how to modify HEP over time, and the timeline  associated with reduction in HEP frequency with modified independence over two sessions    Time 5    Status New            Plan - 11/25/20 1221    Clinical Impression Statement Pt presents this morning with puzzling swallow function. Wife states/pt agrees coffee this morning at home with 2-3 drinks had WNL pharyngeal clearance, however pt expectorated both water sips today- pt wrote "I think I spit more than I sipted (sipped)." and , "I think it (swallowing) was better last week." Modified (MBSS) suggested to ascertain what is happening with pt's swallow and assess possible severity of decr'd pharyngeal clearance. Esophageal assessment 11-10-20 appeared WNL.Pt will cont to need to be seen by SLP in order to assess safety of PO intake, assess the need for recommending further objective swallow assessments, and ensuring pt correctly completes the individualized HEP.    Speech Therapy Frequency --   once approx every four weeks   Duration --   7 total sessions   Treatment/Interventions Pharyngeal strengthening exercises;Diet toleration management by SLP;Trials of upgraded texture/liquids;Patient/family education;Compensatory strategies;SLP instruction and feedback    Potential to Achieve Goals Fair    Potential Considerations Cooperation/participation level;Previous level of function    SLP Home Exercise Plan provided today    Consulted and Agree with Plan of Care Patient           Patient will benefit from skilled therapeutic intervention in order to improve the following deficits and impairments:   Dysphagia, unspecified type    Problem List Patient Active Problem List   Diagnosis Date Noted  . Open  wound of pharynx 10/08/2020  . 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 Protein-calorie malnutrition, severe 09/16/2020  . S/P laryngectomy 09/14/2020  . Pre-op evaluation 09/13/2020  . Coronary artery disease involving native coronary artery of native heart without angina pectoris  09/13/2020  . 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 Lesion of vocal cord 11/26/2012  . 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 [place], [place], [address] Phone: [phone]   Fax:  [phone]  Name: Lynford Espinoza MRN: [id number] Date of Birth: September 29, 1946

## 2020-11-25 NOTE — Progress Notes (Signed)
[place] CSW Progress Notes  Met w patient and wife n Head and Neck MDC.  Confirmed that wife has gotten needed nutritional supplements from their supplier.  Explained role of [place] and CSW in dealing with challenges of treatment for head/neck cancer.  Encouraged positive coping strategies and engagement with treatment team as needed.  Edwyna Shell, LCSW Clinical Social Worker Phone:  [phone]

## 2020-11-25 NOTE — Progress Notes (Signed)
START OFF PATHWAY REGIMEN - Head and Neck   [id number]:Carboplatin AUC=1.5 Weekly + RT:   Administer weekly:     Carboplatin   **Always confirm dose/schedule in your pharmacy ordering system**  Patient Characteristics: Larynx, Local Recurrence, Candidate for Radiation Therapy Disease Classification: Larynx AJCC T Category: [place] AJCC 8 Stage Grouping: Unknown Therapeutic Status: Local Recurrence AJCC N Category: cN2 AJCC M Category: M0 Is patient a candidate for radiation therapy<= Yes Intent of Therapy: Curative Intent, Discussed with Patient

## 2020-11-25 NOTE — Therapy (Signed)
[place], [place], [address] Phone: [phone]   Fax:  [phone]  Physical Therapy Evaluation  Patient Details  Name: Marcus Collier MRN: [id number] Date of Birth: 1946-10-08 Referring Provider (PT): Reita May Date: 11/25/2020   PT End of Session - 11/25/20 0858    Visit Number 1    Number of Visits 2    Date for PT Re-Evaluation 01/20/21    Activity Tolerance Patient tolerated treatment well    Behavior During Therapy [facility] for tasks assessed/performed           Past Medical History:  Diagnosis Date  . Acute myocardial infarction ([place])   . Aortic stenosis    mild AS 09/13/20 echo [facility])  . 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 Family history of adverse reaction to anesthesia   . Headache    migraine  . Hoarseness of voice   . Hypertension   . 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 S/P radiation therapy 12/10/2012 - 01/20/2013   Vocal Cords/ 63 Gray/ 28 Fractions  . Unspecified malignant neoplasm of skin, site unspecified     Past Surgical History:  Procedure Laterality Date  . Biopsy of Vocal cord  11/15/12   Right and Left  . Biospy mouth  11/15/12  . CARDIAC CATHETERIZATION     with stents   . CLOSURE ORAL ANTRAL FISTULA N/A 10/08/2020   Procedure: CLOSURE ORAL ANTRAL FISTULA;  Surgeon: Izora Gala, MD;  Location: [place];  Service: ENT;  Laterality: N/A;  . COLONOSCOPY    . CORONARY ANGIOPLASTY    . DIRECT LARYNGOSCOPY N/A 08/11/2020   Procedure: DIRECT LARYNGOSCOPY WITH BIOPSY;  Surgeon: Izora Gala, MD;  Location: [place];  Service: ENT;  Laterality: N/A;  . GASTROSTOMY TUBE PLACEMENT  09/28/2020  . IR GASTROSTOMY TUBE MOD SED  09/28/2020  . LARYNGETOMY N/A 09/14/2020   Procedure: LARYNGECTOMY Total;  Surgeon: Izora Gala, MD;  Location: [place];  Service: ENT;  Laterality: N/A;  NEEDS RNFA  . PECTORALIS FLAP N/A 10/08/2020    Procedure: PHARYNGEAL RECONSTRUCTION WITH PECTORALIS MAJOR MYOCUTANEOUS FLAP;  Surgeon: Cindra Presume, MD;  Location: [place];  Service: Plastics;  Laterality: N/A;  . SKIN SPLIT GRAFT N/A 10/08/2020   Procedure: SKIN GRAFT SPLIT THICKNESS;  Surgeon: Cindra Presume, MD;  Location: [place];  Service: Plastics;  Laterality: N/A;    There were no vitals filed for this visit.    Subjective Assessment - 11/25/20 1036    Subjective Pt reports he has a headache.    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 Yes    Pain Score 8     Pain Location Head    Pain Descriptors / Indicators --   unable to say because it changes   Pain Type Chronic pain    Pain Onset More than a month ago    Pain Frequency Intermittent    Aggravating Factors  sitting upright    Pain Relieving Factors better when lying down    Effect of Pain on Daily Activities has to lie back              [facility] PT Assessment - 11/25/20 0001      Assessment   Medical Diagnosis SCC of R AE fold  and in situ in L AE fold    Referring Provider (PT) Isidore Moos    Onset Date/Surgical Date 08/05/20    Hand Dominance Right    Prior Therapy none      Precautions   Precautions Other (comment)    Precaution Comments active cancer      Restrictions   Weight Bearing Restrictions No      Balance Screen   Has the patient fallen in the past 6 months Yes    How many times? 2   pt was dizzy and went down slowly   Has the patient had a decrease in activity level because of a fear of falling?  Yes    Is the patient reluctant to leave their home because of a fear of falling?  Yes      [place] residence    Living Arrangements Spouse/significant other    Available Help at Discharge Family    Type of [place] to enter    Entrance Stairs-Number of Steps 4    Entrance Stairs-Rails Can reach both    [place] One level      Prior Function   Level of Independence Needs assistance with homemaking    Vocation Retired    Leisure pt does not currently exercise      Cognition   Overall Cognitive Status Within Functional Limits for tasks assessed      Functional Tests   Functional tests Sit to Stand      Sit to Stand   Comments unable due to head ache      Posture/Postural Control   Posture/Postural Control Postural limitations    Postural Limitations Rounded Shoulders;Forward head      ROM / Strength   AROM / PROM / Strength AROM      AROM   Overall AROM Comments shoulder motion WFL    AROM Assessment Site Cervical    Cervical Flexion 50% limited- stoma    Cervical Extension 25% limited    Cervical - Right Side Bend 50% limited    Cervical - Left Side Bend 50% limited    Cervical - Right Rotation 50% limited    Cervical - Left Rotation 50% limited      Ambulation/Gait   Ambulation/Gait Yes    Ambulation/Gait Assistance 5: Supervision    Ambulation Distance (Feet) 10 Feet    Gait Pattern Decreased arm swing -  right;Decreased arm swing - left;Decreased step length - right;Decreased step length - left;Decreased dorsiflexion - right;Decreased dorsiflexion - left;Shuffle;Poor foot clearance - left;Poor foot clearance - right    Ambulation Surface Level             LYMPHEDEMA/ONCOLOGY QUESTIONNAIRE -  11/25/20 0001      Lymphedema Assessments   Lymphedema Assessments Head and Neck      Head and Neck   Other not measured due to skin not intact above stoma                   Objective measurements completed on examination: See above findings.               PT Education - 11/25/20 0918    Education Details Neck ROM, importance of posture when sitting, standing and lying down, deep breathing, walking program and importance of staying active throughout treatment, CURE article on staying active, "Why exercise?" flyer, lymphedema and PT info    Person(s) Educated Patient    Methods Explanation;Handout    Comprehension Verbalized understanding               PT Long Term Goals - 11/25/20 0902      PT LONG TERM GOAL #1   Title Pt will demonstrate return to baseline ROM measurements and no evidence of lymphedema to allow pt to return to prior level of function.    Time 8    Period Weeks    Status New    Target Date 01/20/21              Head and Neck Clinic Goals - 11/25/20 0902      Patient will be able to verbalize understanding of a home exercise program for cervical range of motion, posture, and walking.    Time 1    Period Days    Status Achieved      Patient will be able to verbalize understanding of proper sitting and standing posture.    Time 1    Period Days    Status Achieved      Patient will be able to verbalize understanding of lymphedema risk and availability of treatment for this condition.    Time 1    Period Days    Status Achieved              Plan - 11/25/20 0920    Clinical Impression Statement Pt presents to  head and neck clinic with recently diagnosed SCC of right AE fold and in situ in L AE fold. He has been having headaches for the past few months that have not been controlled. Pt had to change position in the room frequently due to pain in his head. Educated pt about signs and symptoms of lymphedema as well as anatomy and physiology of lymphatic system. Educated pt in importance of staying as active as possible throughout treatment to decrease fatigue as well as head and neck ROM exercises to decrease loss of ROM. Pt would benefit from PT at this time for his difficulty walking (decreased step length/foot clearance/foot flat) and weakness but they would like to hold off until finishing radiation due to fatigue and number of appointments.  Will see pt after completion of radiation to reassess ROM and assess for lymphedema to determine therapy needs at that time.    Personal Factors and Comorbidities Fitness    Examination-Participation Restrictions Community Activity    Stability/Clinical Decision Making Evolving/Moderate complexity    Clinical Decision Making Moderate    Rehab Potential Good    PT Frequency --   eval and 1 f/u   PT Duration 8 weeks    PT Treatment/Interventions ADLs/Self Care Home Management;Therapeutic exercise;Patient/family education    PT Next Visit Plan reassess baselines  PT Home Exercise Plan head and neck ROM exercises    Consulted and Agree with Plan of Care Patient           Patient will benefit from skilled therapeutic intervention in order to improve the following deficits and impairments:  Pain, Postural dysfunction, Difficulty walking  Visit Diagnosis: Abnormal posture  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 Open wound of pharynx 10/08/2020  . 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 Protein-calorie malnutrition, severe 09/16/2020  . S/P laryngectomy 09/14/2020  .  Pre-op evaluation 09/13/2020  . Coronary artery disease involving native coronary artery of native heart without angina pectoris 09/13/2020  . 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 Lesion of vocal cord 11/26/2012  . Cancer of vocal cord [facility]) 11/26/2012    Allyson Sabal [facility] 11/25/2020, 11:10 AM  [place], [place], [address] Phone: [phone]   Fax:  [phone]  Name: Marcus Collier MRN: [id number] Date of Birth: 1946/05/27  Manus Gunning, PT 11/25/20 11:10 AM

## 2020-11-25 NOTE — Patient Instructions (Signed)
SWALLOWING EXERCISES Do these until 6 months after your last day of radiation, then 2 times per week afterwards  1. Effortful Swallows - Press your tongue against the roof of your mouth for 3 seconds, then squeeze the muscles in your neck while you swallow your saliva or a sip of water - Repeat 10-15 times, 2-3 times a day, and use whenever you eat or drink  2. Mendelsohn Maneuver - "half swallow" exercise - Start to swallow, and keep your Adam's apple up by squeezing hard with the muscles of the throat - Hold the squeeze for 5-7 seconds and then relax - Repeat 10-15 times, 2-3 times a day *use a wet spoon if your mouth gets dry*  3. Chin pushback - Open your mouth  - Place your fist UNDER your chin near your neck - Tuck your chin and push back with your fist for 5 seconds - Repeat 10 times, 2-3 times a day

## 2020-11-26 ENCOUNTER — Other Ambulatory Visit (HOSPITAL_COMMUNITY): Payer: Self-pay

## 2020-11-26 ENCOUNTER — Other Ambulatory Visit: Payer: Self-pay

## 2020-11-26 DIAGNOSIS — C320 Malignant neoplasm of glottis: Secondary | ICD-10-CM

## 2020-11-26 DIAGNOSIS — R1310 Dysphagia, unspecified: Secondary | ICD-10-CM

## 2020-11-29 ENCOUNTER — Other Ambulatory Visit: Payer: Self-pay

## 2020-11-29 ENCOUNTER — Other Ambulatory Visit: Payer: Self-pay | Admitting: Radiation Oncology

## 2020-11-29 ENCOUNTER — Encounter: Payer: Self-pay | Admitting: Internal Medicine

## 2020-11-29 ENCOUNTER — Inpatient Hospital Stay (HOSPITAL_BASED_OUTPATIENT_CLINIC_OR_DEPARTMENT_OTHER): Payer: Medicare Other | Admitting: Internal Medicine

## 2020-11-29 ENCOUNTER — Inpatient Hospital Stay: Payer: Medicare Other

## 2020-11-29 ENCOUNTER — Ambulatory Visit
Admission: RE | Admit: 2020-11-29 | Discharge: 2020-11-29 | Disposition: A | Discharge status: Home / Self Care | Payer: Medicare Other | Source: Ambulatory Visit | Attending: Radiation Oncology | Admitting: Radiation Oncology

## 2020-11-29 DIAGNOSIS — C329 Malignant neoplasm of larynx, unspecified: Secondary | ICD-10-CM | POA: Diagnosis present

## 2020-11-29 DIAGNOSIS — C320 Malignant neoplasm of glottis: Secondary | ICD-10-CM | POA: Diagnosis not present

## 2020-11-29 DIAGNOSIS — G932 Benign intracranial hypertension: Secondary | ICD-10-CM

## 2020-11-29 DIAGNOSIS — Z5111 Encounter for antineoplastic chemotherapy: Secondary | ICD-10-CM | POA: Diagnosis not present

## 2020-11-29 DIAGNOSIS — C328 Malignant neoplasm of overlapping sites of larynx: Secondary | ICD-10-CM

## 2020-11-29 MED ORDER — MORPHINE SULFATE 4 MG/ML IJ SOLN
4.0000 mg | INTRAMUSCULAR | Status: AC
  Filled 2020-11-29: qty 1

## 2020-11-29 MED ORDER — RIZATRIPTAN BENZOATE 10 MG PO TBDP: 10.0000 mg | ORAL_TABLET | ORAL | 0 refills | Status: AC | PRN

## 2020-11-29 MED ORDER — ACETAZOLAMIDE ER 500 MG PO CP12: 500.0000 mg | ORAL_CAPSULE | Freq: Two times a day (BID) | ORAL | 2 refills | Status: AC

## 2020-11-29 MED ORDER — MORPHINE SULFATE (PF) 4 MG/ML IV SOLN
4.0000 mg | Freq: Once | INTRAVENOUS | Status: AC
  Administered 2020-11-29: 4 mg via INTRAMUSCULAR
  Filled 2020-11-29: qty 1

## 2020-11-29 MED ORDER — MORPHINE SULFATE (PF) 4 MG/ML IV SOLN
INTRAVENOUS | Status: AC
  Filled 2020-11-29: qty 1

## 2020-11-29 NOTE — Progress Notes (Signed)
Met with patient's spouse at registration to introduce myself as Arboriculturist and to offer available resources.  Advised of one-time $1000 Radio broadcast assistant to assist with personal expnses while going through treatment.  Gave her my card if interested in applying and for any additional financial questions or concerns.

## 2020-11-30 ENCOUNTER — Ambulatory Visit
Admission: RE | Admit: 2020-11-30 | Discharge: 2020-11-30 | Disposition: A | Discharge status: Home / Self Care | Payer: Medicare Other | Source: Ambulatory Visit | Attending: Radiation Oncology | Admitting: Radiation Oncology

## 2020-11-30 ENCOUNTER — Other Ambulatory Visit: Payer: Self-pay

## 2020-11-30 ENCOUNTER — Inpatient Hospital Stay: Payer: Medicare Other | Admitting: Nutrition

## 2020-11-30 DIAGNOSIS — C320 Malignant neoplasm of glottis: Secondary | ICD-10-CM

## 2020-11-30 DIAGNOSIS — G932 Benign intracranial hypertension: Secondary | ICD-10-CM | POA: Insufficient documentation

## 2020-11-30 MED ORDER — MORPHINE SULFATE 4 MG/ML IJ SOLN
4.0000 mg | Freq: Once | INTRAMUSCULAR | Status: AC
  Administered 2020-11-30: 4 mg via SUBCUTANEOUS
  Filled 2020-11-30: qty 1

## 2020-11-30 MED ORDER — MORPHINE SULFATE 4 MG/ML IJ SOLN
4.0000 mg | Freq: Once | INTRAMUSCULAR | Status: DC
  Filled 2020-11-30: qty 1

## 2020-11-30 NOTE — Progress Notes (Signed)
m °

## 2020-11-30 NOTE — Progress Notes (Signed)
[facility] [place], [place] [address] [phone]   New Patient Evaluation  Date of Service: 11/30/20 Patient Name: Marcus Collier Patient MRN: [id number] Patient DOB: 1946/09/07 Provider: Ventura Sellers, MD  Identifying Statement:  Marcus Collier is a 74 y.o. male with headaches who presents for initial consultation and evaluation regarding cancer associated neurologic deficits.    Referring Provider: [name], MD [address] 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      History of Present Illness: The patient's records from the referring physician were obtained and reviewed and the patient interviewed to confirm this HPI.  Marcus Collier presents with new onset headaches.  Symptoms are described as "pressure all over, 9/10" that occurs each morning and also other times during the day, in particular after lying down.  The pain is worsened with bearing down for bowel movement or coughing.  There are no accompanying visual symptoms, nausea/vomiting or photophobia.  Frequency is daily or multiple times per day.  He has been dosing Tylenol or Hydrocodone several times per day because high burden of symptoms and poor quality of life.  Recently diagnosed with large cervical chain nodes bilaterally,  beginning radiation therapy today with Dr. Isidore Moos.  Medications: Current Outpatient Medications on File Prior to Visit  Medication Sig Dispense Refill  . acetaminophen (TYLENOL) 500 MG tablet Take 500 mg by mouth every 6 (six) hours as needed for headache.    Marland Kitchen amLODipine (NORVASC) 10 MG tablet Take 10 mg by mouth every morning.     Marland Kitchen atorvastatin (LIPITOR) 80 MG tablet Take 80 mg by mouth every evening.    . hydrALAZINE (APRESOLINE) 10 MG tablet Take 1 tablet (10 mg total) by mouth 2 (two) times daily. 60 tablet 0  . HYDROcodone-acetaminophen (HYCET) 7.5-325 mg/15 ml solution Place 15 mLs into feeding tube every 6 (six) hours as needed for moderate pain. 250 mL 0  . ibuprofen (ADVIL) 200 MG tablet Take 200 mg by mouth every 6 (six) hours as needed for headache.    . quinapril (ACCUPRIL) 40 MG tablet Take 40 mg by mouth every morning.      No current facility-administered medications on file prior to visit.    Allergies:  Allergies  Allergen Reactions  . Penicillins Other (See Comments)    Unknown reaction/ Childhood   Past Medical History:  Past Medical History:  Diagnosis Date  . Acute myocardial infarction ([facility])   . Aortic stenosis    mild AS 09/13/20 echo [facility])  . 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 Family history of adverse reaction to anesthesia   . Headache    migraine  . Hoarseness of voice   . Hypertension   . 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  S/P radiation therapy 12/10/2012 - 01/20/2013   Vocal Cords/ 63 Gray/ 28 Fractions  . Unspecified malignant neoplasm of skin, site unspecified    Past Surgical History:  Past Surgical History:  Procedure Laterality Date  . Biopsy of Vocal cord  11/15/12   Right and Left  . Biospy mouth  11/15/12  . CARDIAC CATHETERIZATION     with stents   . CLOSURE ORAL ANTRAL FISTULA N/A 10/08/2020   Procedure: CLOSURE ORAL ANTRAL FISTULA;  Surgeon: Izora Gala, MD;  Location: [facility];   Service: ENT;  Laterality: N/A;  . COLONOSCOPY    . CORONARY ANGIOPLASTY    . DIRECT LARYNGOSCOPY N/A 08/11/2020   Procedure: DIRECT LARYNGOSCOPY WITH BIOPSY;  Surgeon: Izora Gala, MD;  Location: [facility];  Service: ENT;  Laterality: N/A;  . GASTROSTOMY TUBE PLACEMENT  09/28/2020  . IR GASTROSTOMY TUBE MOD SED  09/28/2020  . LARYNGETOMY N/A 09/14/2020   Procedure: LARYNGECTOMY Total;  Surgeon: Izora Gala, MD;  Location: [facility];  Service: ENT;  Laterality: N/A;  NEEDS RNFA  . PECTORALIS FLAP N/A 10/08/2020   Procedure: PHARYNGEAL RECONSTRUCTION WITH PECTORALIS MAJOR MYOCUTANEOUS FLAP;  Surgeon: Cindra Presume, MD;  Location: [facility];  Service: Plastics;  Laterality: N/A;  . SKIN SPLIT GRAFT N/A 10/08/2020   Procedure: SKIN GRAFT SPLIT THICKNESS;  Surgeon: Cindra Presume, MD;  Location: [facility];  Service: Plastics;  Laterality: N/A;   Social History:  Social History   Socioeconomic History  . Marital status: Married    Spouse name: Not on file  . Number of children: 2  . Years of education: Not on file  . Highest education level: Not on file  Occupational History  . Not on file  Tobacco Use  . Smoking status: Former Smoker    Packs/day: 1.00    Years: 52.00    Pack years: 52.00    Types: Cigarettes    Quit date: 08/2020    Years since quitting: 0.2  . Smokeless tobacco: Never Used  . Tobacco comment: stooped in 2003 started again and stopped 2021  Vaping Use  . Vaping Use: Never used  Substance and Sexual Activity  . Alcohol use: Not Currently    Comment: social drinker  . Drug use: Never  . Sexual activity: Not Currently    Partners: Female  Other Topics Concern  . Not on file  Social History Narrative  . Not on file   Social Determinants of Health   Financial Resource Strain: Low Risk   . Difficulty of Paying Living Expenses: Not hard at all  Food Insecurity: No Food Insecurity  . Worried About Charity fundraiser in the Last Year: Never true  . Ran Out of Food in  the Last Year: Never true  Transportation Needs: No Transportation Needs  . Lack of Transportation (Medical): No  . Lack of Transportation (Non-Medical): No  Physical Activity:   . Days of Exercise per Week: Not on file  . Minutes of Exercise per Session: Not on file  Stress: No Stress Concern Present  . Feeling of Stress : Only a little  Social Connections: Socially Integrated  . Frequency of Communication with Friends and Family: Twice a week  . Frequency of Social Gatherings with Friends and Family: Once a week  . Attends Religious Services: More than 4 times per year  . Active Member of Clubs or Organizations: Yes  . Attends Archivist Meetings: More than 4 times per year  .  Marital Status: Married  Human resources officer Violence:   . Fear of Current or Ex-Partner: Not on file  . Emotionally Abused: Not on file  . Physically Abused: Not on file  . Sexually Abused: Not on file   Family History:  Family History  Problem Relation Age of Onset  . Heart attack Mother   . Heart attack Father   . Hypertension Father     Review of Systems: Constitutional: Doesn't report fevers, chills or abnormal weight loss Eyes: Doesn't report blurriness of vision Ears, nose, mouth, throat, and face: Doesn't report sore throat Respiratory: Doesn't report cough, dyspnea or wheezes Cardiovascular: Doesn't report palpitation, chest discomfort  Gastrointestinal:  Doesn't report nausea, constipation, diarrhea GU: Doesn't report incontinence Skin: Doesn't report skin rashes Neurological: Per HPI Musculoskeletal: Doesn't report joint pain Behavioral/Psych: Doesn't report anxiety  Physical Exam: Vitals:   11/29/20 1449  BP: (!) 141/58  Pulse: 94  Resp: 16  Temp: 97.6 F (36.4 C)  SpO2: 93%   KPS: 70. General: Alert, cooperative, pleasant, in no acute distress Head: Normal EENT: Dysphonic Lungs: Resp effort normal Cardiac: Regular rate Abdomen: Non-distended abdomen Skin: No  rashes cyanosis or petechiae. Extremities: No clubbing or edema  Neurologic Exam: Mental Status: Awake, alert, attentive to examiner. Oriented to self and environment. Language is fluent with intact comprehension.  Cranial Nerves: Visual acuity is grossly normal. Visual fields are full. Extra-ocular movements intact. No ptosis. Face is symmetric Motor: Tone and bulk are normal. Power is full in both arms and legs. Reflexes are symmetric, no pathologic reflexes present.  Sensory: Intact to light touch Gait: Deferred  Labs: I have reviewed the data as listed    Component Value Date/Time   NA 133 (L) 10/10/2020 0830   K 4.9 10/10/2020 0830   CL 100 10/10/2020 0830   CO2 25 10/10/2020 0830   GLUCOSE 145 (H) 10/10/2020 0830   BUN 22 11/17/2020 1212   CREATININE 0.79 11/17/2020 1212   CALCIUM 8.4 (L) 10/10/2020 0830   PROT 5.8 (L) 09/29/2020 0343   ALBUMIN 2.1 (L) 09/29/2020 0343   AST 19 09/29/2020 0343   ALT 35 09/29/2020 0343   ALKPHOS 81 09/29/2020 0343   BILITOT 0.5 09/29/2020 0343   GFRNONAA >60 11/17/2020 1212   GFRAA >60 09/18/2020 0136   Lab Results  Component Value Date   WBC 39.1 (H) 11/24/2020   NEUTROABS 34.9 (H) 11/24/2020   HGB 10.7 (L) 11/24/2020   HCT 31.4 (L) 11/24/2020   MCV 89.0 11/24/2020   PLT 407 (H) 11/24/2020    Imaging:  CT HEAD W & WO CONTRAST  Result Date: 11/01/2020 CLINICAL DATA:  Laryngeal cancer.  Headaches for 2 months. EXAM: CT HEAD WITHOUT AND WITH CONTRAST TECHNIQUE: Contiguous axial images were obtained from the base of the skull through the vertex without and with intravenous contrast CONTRAST:  41mL ISOVUE-300 IOPAMIDOL (ISOVUE-300) INJECTION 61% COMPARISON:  Head CT 04/15/2019. FINDINGS: Brain: Mild generalized cerebral atrophy. There is no acute intracranial hemorrhage. No demarcated cortical infarct. No extra-axial fluid collection. No evidence of intracranial mass. No midline shift. Partially empty sella turcica. No abnormal  intracranial enhancement. Vascular: No hyperdense vessel is identified on precontrast imaging. Atherosclerotic calcifications. Expected vascular enhancement. Skull: Normal. Negative for fracture or focal lesion. Sinuses/Orbits: Visualized orbits show no acute finding. No significant paranasal sinus disease at the imaged levels. IMPRESSION: No evidence of acute intracranial abnormality. Mild cerebral atrophy, stable as compared to the head CT of 04/15/2019. Electronically Signed   By:  Kellie Simmering DO   On: 11/01/2020 09:22   CT SOFT TISSUE NECK W CONTRAST  Result Date: 11/01/2020 CLINICAL DATA:  There angio cancer. Pharyngo cutaneous fistula. History of external beam radiation therapy. EXAM: CT NECK WITH CONTRAST TECHNIQUE: Multidetector CT imaging of the neck was performed using the standard protocol following the bolus administration of intravenous contrast. CONTRAST:  36mL ISOVUE-300 IOPAMIDOL (ISOVUE-300) INJECTION 61% COMPARISON:  Neck CT 08/05/2020. 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 There is nonspecific diffuse mucosal/submucosal edema within the nasopharynx and oropharynx. New from the prior examination, there is 0.8 cm peripherally enhancing focus in the region of the right palatine tonsil which may reflect metachronous tumor or a necrotic retropharyngeal lymph node (series 5, image 31). Salivary glands: Hyperenhancement of the submandibular glands bilaterally, likely related to prior radiation therapy. The parotid glands are unremarkable. Thyroid: The right thyroid lobe is poorly delineated. Unremarkable appearance of the left thyroid lobe. Lymph nodes: New from the prior neck CT of 08/05/2020, there is bulky bilateral necrotic level II/III lymphadenopathy. The dominant necrotic nodal mass on the right measures 4.3 x 3.2 cm in transaxial dimensions (series 5, image  45). The dominant necrotic nodal mass on the left measures 3.6 x 3.7 cm in transaxial dimensions (series 5, image 48) Vascular: Bulky bilateral level II/III lymphadenopathy completely effaces the internal jugular veins at this level. Suspected thrombus within the left internal jugular vein at the level of the upper neck (for instance as seen on series 5, image 35). Atherosclerotic calcifications within the visualized aortic arch, proximal major branch vessels of the neck and carotid arteries. Limited intracranial: Intracranial contents better evaluated on concurrently performed head CT. Visualized orbits: Incompletely imaged. Visualized portions show no mass or acute finding. Mastoids and visualized paranasal sinuses: No significant paranasal sinus disease or mastoid effusion. Skeleton: Cervical spondylosis. No acute bony abnormality or aggressive osseous lesion. Upper chest: No consolidation within the imaged lung apices. Centrilobular and paraseptal emphysema. Other: There are scattered small foci of gas within the ventral soft tissues at the level of the neopharynx likely reflecting sinus tracts. Tracheostomy tube in place. These results will be called to the ordering clinician or representative by the Radiologist Assistant, and communication documented in the PACS or [employer]. 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 Nonspecific diffuse mucosal/submucosal edema within the nasopharynx and oropharynx. New 0.8 cm peripherally enhancing focus in the region of the right palatine tonsil, which may reflect metachronous tumor or a necrotic retropharyngeal lymph node. Also new from the prior neck CT, there is bulky necrotic bilateral level 2/3 lymphadenopathy. The dominant nodal mass on the right measures 4.3 x 3.2 cm. The dominant nodal mass on the left measures 3.6 x 3.7 cm. Scattered  small foci of gas within the ventral neck soft tissues at the level of the neopharynx, likely reflecting sinus tracts. No definite extension to the skin surface is identified, however, correlate with findings on physical exam. The bulky lymphadenopathy completely effaces the jugular veins. Suspected thrombus within the left internal jugular vein at the level of the upper neck. Electronically Signed   By: Kellie Simmering DO   On: 11/01/2020 09:16   NM PET Image Initial (PI) Skull Base To Thigh  Result Date: 11/15/2020 CLINICAL DATA:  Subsequent treatment strategy  for laryngeal carcinoma post laryngectomy and closure of oral antral fistula. EXAM: NUCLEAR MEDICINE PET SKULL BASE TO THIGH TECHNIQUE: 7.88 mCi F-18 FDG was injected intravenously. Full-ring PET imaging was performed from the skull base to thigh after the radiotracer. CT data was obtained and used for attenuation correction and anatomic localization. Fasting blood glucose: 155 mg/dl COMPARISON:  CT neck 11/01/2020 and 08/05/2020, chest CT 08/05/2020 and abdominal CT 09/28/2020. FINDINGS: Mediastinal blood pool activity: SUV max 2.0 NECK: The recently demonstrated bulky bilateral level II/III cervical adenopathy is markedly hypermetabolic.Node on the right measuring approximately 5.5 x 4.3 cm transverse on image 46/3 has an SUV max 26.0. Node on the left measuring approximately 5.1 x 3.6 cm on image 45/3 has an SUV max of 18.4. Apart from these dominant hypermetabolic lymph nodes, no other hypermetabolic nodes are seen within the neck. No discrete lesions of the pharyngeal mucosal space are seen. Specifically, no right tonsillar hypermetabolic activity is present. The bulky cervical adenopathy exerts mild mass effect on the pharynx. Incidental CT findings: Status post total laryngectomy and tracheostomy placement. Bilateral carotid atherosclerosis. CHEST: There are no hypermetabolic mediastinal, hilar or axillary lymph nodes. No hypermetabolic pulmonary  activity or suspicious pulmonary nodularity. Left upper lobe nodules measuring up to 6 mm on image 98/3 are stable. The previously demonstrated subpleural nodule in the right lower lobe appears smaller (image 129/3). Incidental CT findings: Moderate centrilobular and paraseptal emphysema. Diffuse atherosclerosis of the aorta, great vessels and coronary arteries. Calcifications of the aortic valve. ABDOMEN/PELVIS: There is no hypermetabolic activity within the liver, adrenal glands, spleen or pancreas. There is no hypermetabolic nodal activity. Incidental CT findings: Again demonstrated is a bilobed infrarenal abdominal aortic aneurysm which has a maximal transverse diameter of 3.7 cm proximally and 3.3 cm distally. Moderate stool throughout the colon. Sigmoid diverticulosis noted. SKELETON: Focal hypermetabolic activity within the left anterior chest wall, near the left 6th costochondral junction (SUV max 4.2). There is suspicion of a costal injury in this area (image 128/3). No chest wall mass. No other suspicious osseous activity. Incidental CT findings: none IMPRESSION: 1. The recently demonstrated bulky bilateral cervical adenopathy is intensely hypermetabolic consistent with metastatic disease. 2. No focal lesions of the pharyngeal mucosal space status post total laryngectomy. 3. No distant metastases. 4. Probable costal injury in the anterior chest wall at the left 6th costochondral junction. 5. Stable bilobed abdominal aortic aneurysm. Recommend follow-up ultrasound every 2 years. This recommendation follows ACR consensus guidelines: White Paper of the ACR Incidental Findings Committee II on Vascular Findings. J Am Coll Radiol 2013; 10:789-794. Electronically Signed   By: Richardean Sale M.D.   On: 11/15/2020 16:41   DG ESOPHAGUS W SINGLE CM (SOL OR THIN BA)  Result Date: 11/10/2020 CLINICAL DATA:  Recurrent head neck cancer. EXAM: ESOPHOGRAM/BARIUM SWALLOW TECHNIQUE: Single contrast examination was  performed using water-soluble contrast. FLUOROSCOPY TIME:  Fluoroscopy Time:  0 minutes and 18 seconds Radiation Exposure Index (if provided by the fluoroscopic device): 23 mGy Number of Acquired Spot Images: 8 COMPARISON:  Neck CT 11/01/2020 FINDINGS: Limited examination.  Patient had a hard time standing. Swallows of Omnipaque 300 do not demonstrate any evidence of aspiration. No leaking contrast was identified. No fistulas were identified. IMPRESSION: No aspiration or leakage of contrast material was demonstrated. Electronically Signed   By: Marijo Sanes M.D.   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  Quality of symptoms, and timing concurrent with rapid development of bilateral neck nodal masses, both of which clearly obstruct jugular venous drainage (bilateral), as well as lack of other headache risk factors or headache history at age 53, make this association very likely.  Fortunately there are no clinical or radiographic features of frank hydrocephalus.  We strongly encouraged him to proceed with treatment of underlying malignancy, which would help relieve pressure and normalize intracranial venous outflow.  In the meantime, will continue to manage symptoms with PRN analgesia as prior.  We provided script for Maxalt sublingual as an additional abortive headache measure.  In addition, we recommended trial of acetazolomide 500mg  BID to relieve pressure from cerebrospinal fluid compartment.    We spent twenty additional minutes teaching regarding the natural history, biology, and historical experience in the treatment of neurologic complications of cancer.   We appreciate the opportunity to participate in the care of Marcus Collier.  We can follow up with him in 1-2 weeks or as needed for further headache management during radiation therapy.  All  questions were answered. The patient knows to call the clinic with any problems, questions or concerns. No barriers to learning were detected.  The total time spent in the encounter was 40 minutes and more than 50% was on counseling and review of test results   Ventura Sellers, MD Medical Director of Neuro-Oncology [facility] 11/30/20 3:18 PM

## 2020-12-01 ENCOUNTER — Encounter: Payer: Self-pay | Admitting: Hematology and Oncology

## 2020-12-01 ENCOUNTER — Other Ambulatory Visit: Payer: Self-pay

## 2020-12-01 ENCOUNTER — Telehealth: Payer: Self-pay | Admitting: *Deleted

## 2020-12-01 ENCOUNTER — Ambulatory Visit
Admission: RE | Admit: 2020-12-01 | Discharge: 2020-12-01 | Disposition: A | Discharge status: Home / Self Care | Payer: Medicare Other | Source: Ambulatory Visit | Attending: Radiation Oncology | Admitting: Radiation Oncology

## 2020-12-01 ENCOUNTER — Inpatient Hospital Stay: Payer: Medicare Other

## 2020-12-01 DIAGNOSIS — C329 Malignant neoplasm of larynx, unspecified: Secondary | ICD-10-CM

## 2020-12-01 DIAGNOSIS — C320 Malignant neoplasm of glottis: Secondary | ICD-10-CM

## 2020-12-01 MED ORDER — MORPHINE SULFATE (PF) 4 MG/ML IV SOLN
INTRAVENOUS | Status: AC
  Filled 2020-12-01: qty 1

## 2020-12-01 MED ORDER — MORPHINE SULFATE (PF) 4 MG/ML IV SOLN
4.0000 mg | Freq: Every day | INTRAVENOUS | Status: DC | PRN
  Administered 2020-12-01: 4 mg via INTRAMUSCULAR
  Filled 2020-12-01: qty 1

## 2020-12-01 NOTE — Progress Notes (Signed)
Pharmacist Chemotherapy Monitoring - Initial Assessment    Anticipated start date: 12/02/20  Regimen:  . Are orders appropriate based on the patient's diagnosis, regimen, and cycle? Yes . Does the plan date match the patient's scheduled date? Yes . Is the sequencing of drugs appropriate? Yes . Are the premedications appropriate for the patient's regimen? Yes . Prior Authorization for treatment is: Approved o If applicable, is the correct biosimilar selected based on the patient's insurance? not applicable  Organ Function and Labs: Marland Kitchen Are dose adjustments needed based on the patient's renal function, hepatic function, or hematologic function? Yes . Are appropriate labs ordered prior to the start of patient's treatment? Yes . Other organ system assessment, if indicated: N/A and women of childbearing potential: pregnancy status  . The following baseline labs, if indicated, have been ordered: N/A  Dose Assessment: . Are the drug doses appropriate? Yes . Are the following correct: o Drug concentrations Yes o IV fluid compatible with drug Yes o Administration routes Yes o Timing of therapy Yes . If applicable, does the patient have documented access for treatment and/or plans for port-a-cath placement? yes . If applicable, have lifetime cumulative doses been properly documented and assessed? yes Lifetime Dose Tracking  No doses have been documented on this patient for the following tracked chemicals: Doxorubicin, Epirubicin, Idarubicin, Daunorubicin, Mitoxantrone, Bleomycin, Oxaliplatin, Carboplatin, Liposomal Doxorubicin  o   Toxicity Monitoring/Prevention: . The patient has the following take home antiemetics prescribed: Prochlorperazine . The patient has the following take home medications prescribed: N/A . Medication allergies and previous infusion related reactions, if applicable, have been reviewed and addressed. Yes . The patient's current medication list has been assessed for  drug-drug interactions with their chemotherapy regimen. no significant drug-drug interactions were identified on review.  Order Review: . Are the treatment plan orders signed? Yes . Is the patient scheduled to see a provider prior to their treatment? No  I verify that I have reviewed each item in the above checklist and answered each question accordingly.  Marcus Collier 12/01/2020 11:24 AM

## 2020-12-01 NOTE — Telephone Encounter (Signed)
Pt did not show for Education.  Checked with radiation & he was not treated today.  Found pt & wife waiting for valet & wife states he isn't going to do education today.  "He can't handle it".  Will notify Dr Chryl Heck.

## 2020-12-02 ENCOUNTER — Encounter: Payer: Self-pay | Admitting: Hematology and Oncology

## 2020-12-02 ENCOUNTER — Ambulatory Visit
Admission: RE | Admit: 2020-12-02 | Discharge: 2020-12-02 | Disposition: A | Discharge status: Home / Self Care | Payer: Medicare Other | Source: Ambulatory Visit | Attending: Radiation Oncology | Admitting: Radiation Oncology

## 2020-12-02 ENCOUNTER — Inpatient Hospital Stay: Payer: Medicare Other

## 2020-12-02 ENCOUNTER — Other Ambulatory Visit: Payer: Self-pay | Admitting: *Deleted

## 2020-12-02 ENCOUNTER — Other Ambulatory Visit: Payer: Self-pay

## 2020-12-02 ENCOUNTER — Inpatient Hospital Stay (HOSPITAL_BASED_OUTPATIENT_CLINIC_OR_DEPARTMENT_OTHER): Payer: Medicare Other | Admitting: Hematology and Oncology

## 2020-12-02 VITALS — BP 132/69 | HR 93 | Temp 97.9°F | Resp 18 | Ht 68.5 in | Wt 141.0 lb

## 2020-12-02 DIAGNOSIS — C329 Malignant neoplasm of larynx, unspecified: Secondary | ICD-10-CM

## 2020-12-02 DIAGNOSIS — D649 Anemia, unspecified: Secondary | ICD-10-CM

## 2020-12-02 DIAGNOSIS — Z5111 Encounter for antineoplastic chemotherapy: Secondary | ICD-10-CM | POA: Diagnosis not present

## 2020-12-02 DIAGNOSIS — C320 Malignant neoplasm of glottis: Secondary | ICD-10-CM

## 2020-12-02 MED ORDER — MORPHINE SULFATE (PF) 4 MG/ML IV SOLN
INTRAVENOUS | Status: AC
  Filled 2020-12-02: qty 1

## 2020-12-02 MED ORDER — FENTANYL 25 MCG/HR TD PT72
1.0000 | MEDICATED_PATCH | TRANSDERMAL | 0 refills | Status: AC
Start: 2020-12-02 — End: 2021-01-01

## 2020-12-02 MED ORDER — MORPHINE SULFATE (PF) 4 MG/ML IV SOLN
4.0000 mg | Freq: Every day | INTRAVENOUS | Status: DC | PRN
  Administered 2020-12-02: 4 mg via INTRAMUSCULAR
  Filled 2020-12-02: qty 1

## 2020-12-02 MED ORDER — LORAZEPAM 1 MG PO TABS
1.0000 mg | ORAL_TABLET | Freq: Once | ORAL | Status: AC
  Administered 2020-12-02: 1 mg via ORAL
  Filled 2020-12-02: qty 1

## 2020-12-02 MED ORDER — LORAZEPAM 1 MG PO TABS
ORAL_TABLET | ORAL | Status: AC
  Filled 2020-12-02: qty 1

## 2020-12-02 NOTE — Progress Notes (Signed)
[name] CONSULT NOTE  Patient Care Team: [name], MD as PCP - General (Family Medicine)  CHIEF COMPLAINTS/PURPOSE OF CONSULTATION:  Recurrent  Laryngeal cancer.  HISTORY OF PRESENTING ILLNESS:  Marcus Collier 74 y.o. male is here because of newly diagnosed recurrent SCC glottis.  Marcus Collier was initially seen in 2013 for glottic SCC s/p radiation directed at the vocal cords from 12/10/2012- 01/20/2013. He was recently seen by Dr Constance Holster for 2 month history of right ear pain, right neck pain, sore throat and worsening hoarseness. Soft tissue neck CT scan on 08/05/2020 showed likely post radiation changes in the hypopharynx and larynx. It also showed a possible superimposed recurrent malignancy at the level of the glottis and vestibule. There were no enlarged or necrotic lymph nodes. CT scan of chest on that same day showed multiple bilateral pulmonary nodules; the largest part solid nodule measured 8 mm in the right lower lobe. There was also a partially visualized proximal abdominal aortic aneurysm that measured up to 3.4 cm in anterior-posterior dimension.   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  Subsequently, the patient was seen in the ED on 09/14/2020 with chief compliant of shortness of breath. An awake total tracheostomy and total laryngectomy was performed at that time, which revealed invasive moderately to poorly differentiated squamous cell carcinoma with tumor invasion through the thyroid cartilage. Margins of resection were uninvolved.  Nodes were not removed.  The patient was admitted to the hospital and discharged home on 09/24/2020. 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 A gastrostomy tube was inserted on 09/28/2020. The patient was discharged home on 10/02/2020 but was then re-admitted on 10/06/2020 for surgical debridement and repair with pectoralis major myocutaneous flap that was performed on 10/08/2020. He was finally discharged home on 10/12/2020.   Most recent imaging imaging includes:  1. CT scan of head on 11/01/2020 for two-month history of headaches. Results showed mild but stable cerebral atrophy without evidence of acute intracranial abnormality.  2. Soft tissue neck CT scan on 11/01/2020 that showed non-specific diffuse mucosal/submucosal edema within the nasopharynx and oropharynx. There was a new 0.8 cm peripherally enhancing focus in the region of the right palatine tonsil, which may reflect metachronous tumor or a necrotic retropharyngeal lymph node. There was also noted to be new, bulky, necrotic bilateral level 2/3 lymphadenopathy. The dominant nodal mass on the right measured 4.3 x 3.2 cm and the dominant nodal mass on the left measured 3.6 x 3.7 cm. Additionally, there was scattered small foci of gas within the ventral neck soft tissues at the level of the neopharynx, likely reflecting sinus tracts. No definite extension to the skin surface was identified. Finally, there was bulky lymphadenopathy that completed effaced the jugular veins; suspected thrombus within the left internal jugular vein at the level of the upper neck.  3. PET scan on 11/15/2020 showed intensely hypermetabolic bulky bilateral cervical adenopathy that was consistent with metastatic disease. There was also noted to be a probable costal injury in the anterior chest wall at the left 6th costochondral junction and a stable bilobed abdominal aortic aneurysm. There were no focal lesions of the pharyngeal mucosal space status post total  laryngectomy, nor was there any distant metastatic disease.  Initial visit with  Medical Oncology on 11/24/2020 Discussed concurrent chemo radiation, pt wanted to think about it. His wife reached out back to [place] the following day and I placed chemo plan as soon as I heard back. He couldn't make it to the chemo teach, he didn't want to do the chemo today, he skipped 3 radiation treatments. He is here for 1 week FU. Patient writes on a pad. When asked why he is not compliant with radiation, his response was the table is very uncomfortable, he cant lay flat and if we can put him out for radiation and if he can do another mask for the radiation since its very tight. I asked him if headache is also a part of why he is not coming for treatment, he said kind of. No medication has been helping him so far. He tried [place] morphine, oxycodone , ibuprofen and medication prescribed by Dr Mickeal Skinner. His wife says aspirin helped some. Besides headache and claustrophobia with mask, he is using the tube for feeding. He has noted some diarrhea, describes that he has to go many times but only poops little every time  Rest of the pertinent 10 point ROS reviewed and negative  MEDICAL HISTORY:  Past Medical History:  Diagnosis Date  . Acute myocardial infarction ([place])   . Aortic stenosis    mild AS 09/13/20 echo [facility])  . 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 Family history of adverse reaction to anesthesia   . Headache    migraine  . Hoarseness of voice   . Hypertension   . 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 S/P radiation therapy 12/10/2012 - 01/20/2013   Vocal Cords/ 63 Gray/ 28 Fractions  . Unspecified malignant neoplasm of skin, site unspecified     SURGICAL HISTORY: Past Surgical History:  Procedure Laterality Date  . Biopsy of Vocal cord  11/15/12   Right and Left  . Biospy mouth  11/15/12  . CARDIAC CATHETERIZATION     with stents   . CLOSURE ORAL ANTRAL FISTULA N/A 10/08/2020   Procedure: CLOSURE ORAL ANTRAL FISTULA;  Surgeon:  Izora Gala, MD;  Location: [place];  Service: ENT;  Laterality: N/A;  . COLONOSCOPY    . CORONARY ANGIOPLASTY    . DIRECT LARYNGOSCOPY N/A 08/11/2020   Procedure: DIRECT LARYNGOSCOPY WITH BIOPSY;  Surgeon: Izora Gala, MD;  Location: [place];  Service: ENT;  Laterality: N/A;  . GASTROSTOMY TUBE PLACEMENT  09/28/2020  . IR GASTROSTOMY TUBE MOD SED  09/28/2020  . LARYNGETOMY N/A 09/14/2020   Procedure: LARYNGECTOMY Total;  Surgeon: Izora Gala, MD;  Location: [place];  Service: ENT;  Laterality: N/A;  NEEDS RNFA  . PECTORALIS FLAP N/A 10/08/2020   Procedure: PHARYNGEAL RECONSTRUCTION WITH PECTORALIS MAJOR MYOCUTANEOUS FLAP;  Surgeon: Cindra Presume, MD;  Location: [place];  Service: Plastics;  Laterality: N/A;  . SKIN SPLIT GRAFT N/A 10/08/2020   Procedure: SKIN GRAFT SPLIT THICKNESS;  Surgeon: Cindra Presume, MD;  Location: [place];  Service: Plastics;  Laterality: N/A;    SOCIAL HISTORY: Social History   Socioeconomic History  . Marital status: Married    Spouse name: Not on file  . Number of children: 2  . Years of education: Not on file  . Highest education level: Not on file  Occupational History  . Not on file  Tobacco Use  . Smoking status: Former  Smoker    Packs/day: 1.00    Years: 52.00    Pack years: 52.00    Types: Cigarettes    Quit date: 08/2020    Years since quitting: 0.2  . Smokeless tobacco: Never Used  . Tobacco comment: stooped in 2003 started again and stopped 2021  Vaping Use  . Vaping Use: Never used  Substance and Sexual Activity  . Alcohol use: Not Currently    Comment: social drinker  . Drug use: Never  . Sexual activity: Not Currently    Partners: Female  Other Topics Concern  . Not on file  Social History Narrative  . Not on file   Social Determinants of Health   Financial Resource Strain: Low Risk   . Difficulty of Paying Living Expenses: Not hard at all  Food Insecurity: No Food Insecurity  . Worried About Charity fundraiser in the Last Year:  Never true  . Ran Out of Food in the Last Year: Never true  Transportation Needs: No Transportation Needs  . Lack of Transportation (Medical): No  . Lack of Transportation (Non-Medical): No  Physical Activity: Not on file  Stress: No Stress Concern Present  . Feeling of Stress : Only a little  Social Connections: Socially Integrated  . Frequency of Communication with Friends and Family: Twice a week  . Frequency of Social Gatherings with Friends and Family: Once a week  . Attends Religious Services: More than 4 times per year  . Active Member of Clubs or Organizations: Yes  . Attends Archivist Meetings: More than 4 times per year  . Marital Status: Married  Human resources officer Violence: Not on file    FAMILY HISTORY: Family History  Problem Relation Age of Onset  . Heart attack Mother   . Heart attack Father   . Hypertension Father     ALLERGIES:  is allergic to penicillins.  MEDICATIONS:  Current Outpatient Medications  Medication Sig Dispense Refill  . acetaminophen (TYLENOL) 500 MG tablet Take 500 mg by mouth every 6 (six) hours as needed for headache.    Marland Kitchen acetaZOLAMIDE (DIAMOX SEQUELS) 500 MG capsule Take 1 capsule (500 mg total) by mouth 2 (two) times daily. 60 capsule 2  . amLODipine (NORVASC) 10 MG tablet Take 10 mg by mouth every morning.     Marland Kitchen atorvastatin (LIPITOR) 80 MG tablet Take 80 mg by mouth every evening.    . hydrALAZINE (APRESOLINE) 10 MG tablet Take 1 tablet (10 mg total) by mouth 2 (two) times daily. 60 tablet 0  . HYDROcodone-acetaminophen (HYCET) 7.5-325 mg/15 ml solution Place 15 mLs into feeding tube every 6 (six) hours as needed for moderate pain. 250 mL 0  . ibuprofen (ADVIL) 200 MG tablet Take 200 mg by mouth every 6 (six) hours as needed for headache.    . quinapril (ACCUPRIL) 40 MG tablet Take 40 mg by mouth every morning.     . rizatriptan (MAXALT-MLT) 10 MG disintegrating tablet Take 1 tablet (10 mg total) by mouth as needed for  migraine. May repeat in 2 hours if needed 10 tablet 0   No current facility-administered medications for this visit.   Facility-Administered Medications Ordered in Other Visits  Medication Dose Route Frequency Provider Last Rate Last Admin  . LORazepam (ATIVAN) 1 MG tablet           . morphine 4 MG/ML injection 4 mg  4 mg Intramuscular Daily PRN Eppie Gibson, MD   4 mg at 12/01/20 1135  .  morphine 4 MG/ML injection 4 mg  4 mg Intramuscular Daily PRN Eppie Gibson, MD   4 mg at 12/02/20 1254  . morphine 4 MG/ML injection              PHYSICAL EXAMINATION: ECOG PERFORMANCE STATUS: 1 - Symptomatic but completely ambulatory Border line 2.  Vitals:   12/02/20 1405  BP: 132/69  Pulse: 93  Resp: 18  Temp: 97.9 F (36.6 C)  SpO2: 96%   Filed Weights   12/02/20 1405  Weight: 141 lb (64 kg)    GENERAL: alert, no acute distress but restless. SKIN: skin color, texture, turgor are normal, no rashes or significant lesions, Trach site clean NECK: supple, thyroid normal size, non-tender,  LYMPH: Bilateral cervical lymphadenopathy, large masses, increased in size compared to last week's visit. LUNGS: clear to auscultation and percussion with normal breathing effort HEART: regular rate & rhythm and no murmurs and no lower extremity edema PSYCH: alert  NEURO: no focal motor/sensory deficits  LABORATORY DATA:  I have reviewed the data as listed Lab Results  Component Value Date   WBC 39.1 (H) 11/24/2020   HGB 10.7 (L) 11/24/2020   HCT 31.4 (L) 11/24/2020   MCV 89.0 11/24/2020   PLT 407 (H) 11/24/2020   Lab Results  Component Value Date   NA 133 (L) 10/10/2020   K 4.9 10/10/2020   CL 100 10/10/2020   CO2 25 10/10/2020    RADIOGRAPHIC STUDIES: I have personally reviewed the radiological images as listed and agreed with the findings in the report. NM PET Image Initial (PI) Skull Base To Thigh  Result Date: 11/15/2020 CLINICAL DATA:  Subsequent treatment strategy for laryngeal  carcinoma post laryngectomy and closure of oral antral fistula. EXAM: NUCLEAR MEDICINE PET SKULL BASE TO THIGH TECHNIQUE: 7.88 mCi F-18 FDG was injected intravenously. Full-ring PET imaging was performed from the skull base to thigh after the radiotracer. CT data was obtained and used for attenuation correction and anatomic localization. Fasting blood glucose: 155 mg/dl COMPARISON:  CT neck 11/01/2020 and 08/05/2020, chest CT 08/05/2020 and abdominal CT 09/28/2020. FINDINGS: Mediastinal blood pool activity: SUV max 2.0 NECK: The recently demonstrated bulky bilateral level II/III cervical adenopathy is markedly hypermetabolic.Node on the right measuring approximately 5.5 x 4.3 cm transverse on image 46/3 has an SUV max 26.0. Node on the left measuring approximately 5.1 x 3.6 cm on image 45/3 has an SUV max of 18.4. Apart from these dominant hypermetabolic lymph nodes, no other hypermetabolic nodes are seen within the neck. No discrete lesions of the pharyngeal mucosal space are seen. Specifically, no right tonsillar hypermetabolic activity is present. The bulky cervical adenopathy exerts mild mass effect on the pharynx. Incidental CT findings: Status post total laryngectomy and tracheostomy placement. Bilateral carotid atherosclerosis. CHEST: There are no hypermetabolic mediastinal, hilar or axillary lymph nodes. No hypermetabolic pulmonary activity or suspicious pulmonary nodularity. Left upper lobe nodules measuring up to 6 mm on image 98/3 are stable. The previously demonstrated subpleural nodule in the right lower lobe appears smaller (image 129/3). Incidental CT findings: Moderate centrilobular and paraseptal emphysema. Diffuse atherosclerosis of the aorta, great vessels and coronary arteries. Calcifications of the aortic valve. ABDOMEN/PELVIS: There is no hypermetabolic activity within the liver, adrenal glands, spleen or pancreas. There is no hypermetabolic nodal activity. Incidental CT findings: Again  demonstrated is a bilobed infrarenal abdominal aortic aneurysm which has a maximal transverse diameter of 3.7 cm proximally and 3.3 cm distally. Moderate stool throughout the colon. Sigmoid diverticulosis noted.  SKELETON: Focal hypermetabolic activity within the left anterior chest wall, near the left 6th costochondral junction (SUV max 4.2). There is suspicion of a costal injury in this area (image 128/3). No chest wall mass. No other suspicious osseous activity. Incidental CT findings: none IMPRESSION: 1. The recently demonstrated bulky bilateral cervical adenopathy is intensely hypermetabolic consistent with metastatic disease. 2. No focal lesions of the pharyngeal mucosal space status post total laryngectomy. 3. No distant metastases. 4. Probable costal injury in the anterior chest wall at the left 6th costochondral junction. 5. Stable bilobed abdominal aortic aneurysm. Recommend follow-up ultrasound every 2 years. This recommendation follows ACR consensus guidelines: White Paper of the ACR Incidental Findings Committee II on Vascular Findings. J Am Coll Radiol 2013; 10:789-794. Electronically Signed   By: Richardean Sale M.D.   On: 11/15/2020 16:41   DG ESOPHAGUS W SINGLE CM (SOL OR THIN BA)  Result Date: 11/10/2020 CLINICAL DATA:  Recurrent head neck cancer. EXAM: ESOPHOGRAM/BARIUM SWALLOW TECHNIQUE: Single contrast examination was performed using water-soluble contrast. FLUOROSCOPY TIME:  Fluoroscopy Time:  0 minutes and 18 seconds Radiation Exposure Index (if provided by the fluoroscopic device): 23 mGy Number of Acquired Spot Images: 8 COMPARISON:  Neck CT 11/01/2020 FINDINGS: Limited examination.  Patient had a hard time standing. Swallows of Omnipaque 300 do not demonstrate any evidence of aspiration. No leaking contrast was identified. No fistulas were identified. IMPRESSION: No aspiration or leakage of contrast material was demonstrated. Electronically Signed   By: Marijo Sanes M.D.   On:  11/10/2020 08:39   I have reviewed pertinent images.  ASSESSMENT:   This is a 74 yr old male patient who was initially seen in 2013 for glottic SCC s/p radiation directed at the vocal cords from 12/10/2012- 01/20/2013. He was recently seen by Dr Constance Holster for 2 month history of right ear pain, right neck pain, sore throat and worsening hoarseness. Soft tissue neck CT scan on 08/05/2020 showed likely post radiation changes in the hypopharynx and larynx. It also showed a possible superimposed recurrent malignancy at the level of the glottis and vestibule. There were no enlarged or necrotic lymph nodes. CT scan of chest on that same day showed multiple bilateral pulmonary nodules; the largest part solid nodule measured 8 mm in the right lower lobe. There was also a partially visualized proximal abdominal aortic aneurysm that measured up to 3.4 cm in anterior-posterior dimension.   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 Subsequently, the patient was seen in the ED on 09/14/2020 with chief compliant of shortness of breath. An awake total tracheostomy and total laryngectomy was performed at that time, which revealed invasive moderately to poorly differentiated squamous cell carcinoma with tumor invasion through the thyroid cartilage. Margins of resection were uninvolved.  Nodes were not removed.  The patient was admitted to the hospital and discharged home on 09/24/2020. 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 A gastrostomy tube was inserted on 09/28/2020. The patient was discharged home on 10/02/2020 but was then re-admitted on 10/06/2020 for surgical debridement and  repair with pectoralis major myocutaneous flap that was performed on 10/08/2020. He was finally discharged home on 10/12/2020.   He recently had a PET  scan which showed bulky bilateral cervical adenopathy which is intensely hypermetabolic consistent with metastatic disease. No focal lesions of the pharyngeal mucosal space s/p total laryngectomy. No distant metastasis. Probable costal injury in the anterior chest wall at the left 6 th costochondral junction. He is now referred back to medical oncology for consideration of concurrent chemoradiation.   PLAN:   Given recurrent disease without any definitive evidence of distant metastasis, we have discussed about concurrent chemoradiation with cisplatin.  He does have baseline hearing loss and history of CAD and MI, hence I have considered weekly carboplatin. He is not a candidate for carboplatin and 5 FU combination chemotherapy. He was supposed to start chemo this week, but he cancelled his chemo teach and his chemo appointment today. He gave multiple reasons to have skipped radiation, he asked thrice today if we can put him out for radiation, he cant wear the mask, it is very uncomfortable. I passed on his request to NN team and rad oncology team if something can be done immediately prior to radiation to ease his anxiety. With regards to headache, like we have previously discussed, I strongly believe this will improve with treatment. No medication has helped him, his wife says. Last week narcotics were helpful, now even [place] morphine hasnt touched it. We will try fentanyl patch for long acting pain relief, he will also follow Dr Renda Rolls recommendations. If he decides not to proceed with treatment, discussed hospice. He will think about it. Discussed with his son about goals of care as well. We will work on rescheduling his infusion, messages have been sent to infusion team.  He and his wife were previously told that although intent of treatment is  curative, we cant guarantee a cure in this situation, They understand this is their best chance at the cancer. No orders of the defined types were placed in this encounter.  All questions were answered. The patient knows to call the clinic with any problems, questions or concerns. I spent 45 minutes in the care of this patient, review of his medical records, H and P, counseling, discussion with NN and radiation oncology    Benay Pike, MD 12/02/20 3:14 PM

## 2020-12-02 NOTE — Progress Notes (Signed)
Patient arrived early before radiation appointment to received 1 mg Ativan sublingual, and 4 mg Morphine IM. Medications administered and patient monitored for 30 min. At 13:15 checked on patient to see if he was ready to be escorted over for radiation. He wrote down on his tablet that his headache was still too intense for him to lay flat for treatment, and he didn't want try today. Advised him and wife to return to upstairs lobby to check in for appointment with Dr. Chryl Heck since treatment plan may need to be changed if patient cannot receive radiation concurrently with chemotherapy. Updated radiation therapists on L4 that patient declined treatment today. Will continue to support as needed.

## 2020-12-03 ENCOUNTER — Other Ambulatory Visit: Payer: Self-pay

## 2020-12-03 ENCOUNTER — Ambulatory Visit (HOSPITAL_COMMUNITY)
Admission: RE | Admit: 2020-12-03 | Discharge: 2020-12-03 | Disposition: A | Discharge status: Home / Self Care | Payer: Medicare Other | Source: Ambulatory Visit | Attending: Radiation Oncology | Admitting: Radiation Oncology

## 2020-12-03 ENCOUNTER — Telehealth: Payer: Self-pay

## 2020-12-03 ENCOUNTER — Other Ambulatory Visit: Payer: Self-pay | Admitting: Radiation Oncology

## 2020-12-03 ENCOUNTER — Ambulatory Visit
Admission: RE | Admit: 2020-12-03 | Discharge: 2020-12-03 | Disposition: A | Discharge status: Home / Self Care | Payer: Medicare Other | Source: Ambulatory Visit | Attending: Radiation Oncology | Admitting: Radiation Oncology

## 2020-12-03 DIAGNOSIS — R1310 Dysphagia, unspecified: Secondary | ICD-10-CM | POA: Diagnosis present

## 2020-12-03 DIAGNOSIS — C329 Malignant neoplasm of larynx, unspecified: Secondary | ICD-10-CM

## 2020-12-03 DIAGNOSIS — C320 Malignant neoplasm of glottis: Secondary | ICD-10-CM | POA: Insufficient documentation

## 2020-12-03 MED ORDER — LORAZEPAM 1 MG PO TABS
1.0000 mg | ORAL_TABLET | Freq: Once | ORAL | Status: AC
  Administered 2020-12-03: 1 mg via ORAL
  Filled 2020-12-03: qty 1

## 2020-12-03 MED ORDER — MORPHINE SULFATE (PF) 4 MG/ML IV SOLN
4.0000 mg | Freq: Every day | INTRAVENOUS | Status: DC | PRN
  Administered 2020-12-03: 4 mg via INTRAMUSCULAR
  Filled 2020-12-03: qty 1

## 2020-12-03 MED ORDER — MORPHINE SULFATE (PF) 4 MG/ML IV SOLN
INTRAVENOUS | Status: AC
  Filled 2020-12-03: qty 1

## 2020-12-03 MED ORDER — LORAZEPAM 2 MG PO TABS: ORAL_TABLET | ORAL | 0 refills | Status: AC

## 2020-12-03 MED ORDER — LORAZEPAM 1 MG PO TABS
ORAL_TABLET | ORAL | Status: AC
  Filled 2020-12-03: qty 1

## 2020-12-03 NOTE — Progress Notes (Signed)
Modified Barium Swallow Progress Note  Patient Details  Name: Marcus Collier MRN: [id number] Date of Birth: 07/13/1946  Today's Date: 12/03/2020  Modified Barium Swallow completed.  Full report located under Chart Review in the Imaging Section.  Brief recommendations include the following:  Clinical Impression  Pt has a severe oral dysphagia with near immobility of his tongue that precludes him from any posterior transit of boluses, needing oral suction for removal. Upon inspecting his tongue, it appears to be edematous and with altered texture (appearing coated and almost "hairy", also with moderate amounts of dried secretions that were removed). He cannot voluntarily move his tongue in any direction. He also describes reduced sensation and is not aware when he has thin liquids sitting on or around his tongue, although he has a heightened gag reflex. Attempted to siphon thin barium via straw and place it further back in his mouth, but this also had to be suctioned out. Discussed results with pt and wife: with the severity of his dysphagia, continuing alternative means of nutrition is recommended. However, also considering the efffects of XRT on swallowing function (particularly if the swallowing musculature is not being used), and that with his total laryngectomy he is not at risk for aspiration outside of a fistula, recommend continuing to try to swallow small sips. Encouraged ongoing OP SLP f/u and completion of HEP. Also emphasized the importance of oral hygiene. His wife said that they plan to go to the cancer center from this appointment for possible XRT and to meet with Dr. Isidore Collier. SLP encouraged them to show her his tongue as well so that she can monitor this.   Swallow Evaluation Recommendations   SLP Diet Recommendations: Alternative means - long-term;Thin liquid   Liquid Administration via: Spoon;Cup   Medication Administration: Via alternative means   Oral Care Recommendations: Oral  care QID        Marcus Collier., M.A. [facility] Pager [phone] Office [phone]  12/03/2020,1:32 PM

## 2020-12-03 NOTE — Telephone Encounter (Signed)
Dr. Isidore Moos would like patient to try and receive XRT today after modified barium swallow study. Called wife to ask if it would be possible to bring patient to [place] after procedure to talk with Dr. Isidore Moos and be premedicated again to hopefully make him comfortable for treatment. Informed her that stabilizing mask would not be tightened down all the way, so hopefully patient would not feel as restricted. Wife stated she would relay information to patient and bring him to [facility] after procedure, but she wasn't certain he would be agreeable to treatment. Told wife it was important to at least try, and if patient declines treatment after he's here, at least he and Dr. Isidore Moos will be able to have a frank discussion in person. Wife verbalized understanding and agreement of plan, no other needs identified at this time. Wife knows to call me back directly should something change or she/patient have any questions.   Dr. Isidore Moos and therapists on L3 updated on plan

## 2020-12-06 ENCOUNTER — Telehealth: Payer: Self-pay

## 2020-12-06 ENCOUNTER — Ambulatory Visit: Payer: Medicare Other

## 2020-12-06 DIAGNOSIS — C329 Malignant neoplasm of larynx, unspecified: Secondary | ICD-10-CM

## 2020-12-06 NOTE — Telephone Encounter (Signed)
Notified by radiation therapist on L3 that they spoke with patient's wife, and patient will not be attending radiation session today. Therapist stated wife confirmed she would call tomorrow before appointment to update whether or not patient would be attending.   Called patient's wife directly to get an update on symptoms and see if she/patient would be interested in patient being directly admitted to hospital to help provide supportive care. Wife stated patient was finally resting comfortably today, and she did not want to disturb him by bringing him in for appointment. She also stated she did not think patient would be interested in being admitted to the hospital because of all the invasive measures (IV sticks, blood draws, frequent vital signs, etc). Asked wife if she and patient would be interested in talking with a palliative care provider to discuss goals of care/comfort measures. Wife stated she would be interested in at least hearing what their options were, but not necessarily pursuing. Provided wife with my direct call back number should patient's condition change, or she have any future questions/concerns. Wife verbalized understanding and appreciation of call.   Updated Dr. Isidore Moos on patient's condition and wife's preference, and received verbal orders to place palliative care consult. Called Ignacia Bayley with update. Stanton Kidney stated she would familiarize herself with patient's chart and give wife a call either later today or tomorrow to start conversation.

## 2020-12-07 ENCOUNTER — Ambulatory Visit: Admission: RE | Admit: 2020-12-07 | Payer: Medicare Other | Source: Ambulatory Visit

## 2020-12-08 ENCOUNTER — Inpatient Hospital Stay: Payer: Medicare Other | Admitting: Nutrition

## 2020-12-08 ENCOUNTER — Telehealth: Payer: Self-pay

## 2020-12-08 ENCOUNTER — Other Ambulatory Visit (HOSPITAL_COMMUNITY): Payer: Self-pay | Admitting: Hematology and Oncology

## 2020-12-08 ENCOUNTER — Other Ambulatory Visit: Payer: Self-pay

## 2020-12-08 ENCOUNTER — Inpatient Hospital Stay: Payer: Medicare Other

## 2020-12-08 ENCOUNTER — Ambulatory Visit: Admission: RE | Admit: 2020-12-08 | Payer: Medicare Other | Source: Ambulatory Visit

## 2020-12-08 ENCOUNTER — Inpatient Hospital Stay (HOSPITAL_BASED_OUTPATIENT_CLINIC_OR_DEPARTMENT_OTHER): Payer: Medicare Other | Admitting: Medical

## 2020-12-08 VITALS — BP 145/61 | HR 100 | Temp 98.9°F | Resp 18 | Ht 68.5 in | Wt 143.9 lb

## 2020-12-08 DIAGNOSIS — C320 Malignant neoplasm of glottis: Secondary | ICD-10-CM

## 2020-12-08 DIAGNOSIS — G893 Neoplasm related pain (acute) (chronic): Secondary | ICD-10-CM

## 2020-12-08 DIAGNOSIS — Z5111 Encounter for antineoplastic chemotherapy: Secondary | ICD-10-CM | POA: Diagnosis not present

## 2020-12-08 DIAGNOSIS — R739 Hyperglycemia, unspecified: Secondary | ICD-10-CM | POA: Diagnosis not present

## 2020-12-08 DIAGNOSIS — R110 Nausea: Secondary | ICD-10-CM

## 2020-12-08 DIAGNOSIS — D649 Anemia, unspecified: Secondary | ICD-10-CM

## 2020-12-08 LAB — CMP (CANCER CENTER ONLY)
ALT: 37 U/L (ref 0–44)
AST: 18 U/L (ref 15–41)
Albumin: 2 g/dL — ABNORMAL LOW (ref 3.5–5.0)
Alkaline Phosphatase: 159 U/L — ABNORMAL HIGH (ref 38–126)
Anion gap: 5 (ref 5–15)
BUN: 14 mg/dL (ref 8–23)
CO2: 28 mmol/L (ref 22–32)
Calcium: 9 mg/dL (ref 8.9–10.3)
Chloride: 98 mmol/L (ref 98–111)
Creatinine: 0.71 mg/dL (ref 0.61–1.24)
GFR, Estimated: >60 mL/min (ref >60)
Glucose, Bld: 381 mg/dL — ABNORMAL HIGH (ref 70–99)
Potassium: 4.2 mmol/L (ref 3.5–5.1)
Sodium: 131 mmol/L — ABNORMAL LOW (ref 135–145)
Total Bilirubin: 0.3 mg/dL (ref 0.3–1.2)
Total Protein: 6.6 g/dL (ref 6.5–8.1)

## 2020-12-08 LAB — CBC WITH DIFFERENTIAL/PLATELET
Abs Immature Granulocytes: 0.32 10*3/uL — ABNORMAL HIGH (ref 0.00–0.07)
Basophils Absolute: 0.1 10*3/uL (ref 0.0–0.1)
Basophils Relative: 0 %
Eosinophils Absolute: 0 10*3/uL (ref 0.0–0.5)
Eosinophils Relative: 0 %
HCT: 32.2 % — ABNORMAL LOW (ref 39.0–52.0)
Hemoglobin: 10.7 g/dL — ABNORMAL LOW (ref 13.0–17.0)
Immature Granulocytes: 1 %
Lymphocytes Relative: 4 %
Lymphs Abs: 1.3 10*3/uL (ref 0.7–4.0)
MCH: 29.8 pg (ref 26.0–34.0)
MCHC: 33.2 g/dL (ref 30.0–36.0)
MCV: 89.7 fL (ref 80.0–100.0)
Monocytes Absolute: 1.7 10*3/uL — ABNORMAL HIGH (ref 0.1–1.0)
Monocytes Relative: 5 %
Neutro Abs: 33 10*3/uL — ABNORMAL HIGH (ref 1.7–7.7)
Neutrophils Relative %: 90 %
Platelets: 436 10*3/uL — ABNORMAL HIGH (ref 150–400)
RBC: 3.59 MIL/uL — ABNORMAL LOW (ref 4.22–5.81)
RDW: 14 % (ref 11.5–15.5)
WBC: 36.5 10*3/uL — ABNORMAL HIGH (ref 4.0–10.5)
nRBC: 0 % (ref 0.0–0.2)

## 2020-12-08 MED ORDER — ONDANSETRON 4 MG PO TBDP: 4.0000 mg | ORAL_TABLET | Freq: Three times a day (TID) | ORAL | 0 refills | Status: AC | PRN

## 2020-12-08 MED ORDER — PROSOURCE TF FREE EN LIQD: ENTERAL | 3 refills | Status: AC

## 2020-12-08 MED ORDER — INSULIN ASPART 100 UNIT/ML ~~LOC~~ SOLN
SUBCUTANEOUS | Status: AC
  Filled 2020-12-08: qty 1

## 2020-12-08 MED ORDER — PROMETHAZINE HCL 6.25 MG/5ML PO SYRP: 12.5000 mg | ORAL_SOLUTION | Freq: Four times a day (QID) | ORAL | 2 refills | Status: AC | PRN

## 2020-12-08 MED ORDER — HYDROCODONE-ACETAMINOPHEN 7.5-325 MG/15ML PO SOLN: 15.0000 mL | Freq: Four times a day (QID) | ORAL | 0 refills | Status: AC | PRN

## 2020-12-08 MED ORDER — SODIUM CHLORIDE 0.9 % IV SOLN
165.0000 mg | Freq: Once | INTRAVENOUS | Status: AC
  Administered 2020-12-08: 17:00:00 170 mg via INTRAVENOUS
  Filled 2020-12-08: qty 17

## 2020-12-08 MED ORDER — PALONOSETRON HCL INJECTION 0.25 MG/5ML
INTRAVENOUS | Status: AC
  Filled 2020-12-08: qty 5

## 2020-12-08 MED ORDER — SODIUM CHLORIDE 0.9 % IV SOLN
10.0000 mg | Freq: Once | INTRAVENOUS | Status: AC
  Administered 2020-12-08: 16:00:00 10 mg via INTRAVENOUS
  Filled 2020-12-08: qty 1
  Filled 2020-12-08: qty 10

## 2020-12-08 MED ORDER — SODIUM CHLORIDE 0.9 % IV SOLN
Freq: Once | INTRAVENOUS | Status: AC
  Filled 2020-12-08: qty 250

## 2020-12-08 MED ORDER — PALONOSETRON HCL INJECTION 0.25 MG/5ML
0.2500 mg | Freq: Once | INTRAVENOUS | Status: AC
  Administered 2020-12-08: 16:00:00 0.25 mg via INTRAVENOUS

## 2020-12-08 MED ORDER — INSULIN ASPART 100 UNIT/ML ~~LOC~~ SOLN
5.0000 [IU] | Freq: Once | SUBCUTANEOUS | Status: AC
  Administered 2020-12-08: 16:00:00 5 [IU] via SUBCUTANEOUS

## 2020-12-08 NOTE — Progress Notes (Signed)
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  Past medical history of tracheostomy and total laryngectomy, G tube placement in 09/2020, HTN, acute MI.   Plan was to start concurrent chemotherapy and radiation.  Patient has been noncompliant to coming to treatments as planned.   Spoke with wife via phone today regarding nutrition. Noted SLP evaluation in hospital recommended sips of thin liquids otherwise NPO.  Wife reports giving patient 2 cartons of osmolite 1.5 TID via feeding tube.  Gives about 45ml of water flush before feeding and about 160ml water flush after.  Also mixes some water in with tube feeding.  Reports that patient is tolerating feeding. Did have 2 episodes where he vomiting after feeding and wife split feedings up giving only 1 carton and few hours later the other.  She reports that he has been doing well with feeding recently and wants to continue giving him 2 cartons TID.  Wife gives water with medications about 61ml in the AM and PM.  She cut back on water as patient was getting up to go to the bathroom frequently during the night.  Does not have the protein liquid and has not been giving.  Reports some issues with constipation and resolved with miralax after 2 doses (more liquid stool), now improved but wife unsure when last bowel movement was.    Wife reports has adequate supply of enteral supplies and receiving supplies from [facility].    Medications: reviewed   Labs: reviewed    Anthropometrics:   Height: 68.5 inches Weight: 142 lb 9 oz  145 lb on 10/13/20 when left rehab 160 lb 07/2020 BMI: 21 11% weight loss in the last 4 months, signficiant  Estimated Energy Needs  Kcals: [id number] Protein: 100-110 g Fluid: > 1.9 L   NUTRITION DIAGNOSIS: Inadequate oral intake related to dysphagia, cancer and cancer related treatments as evidenced by 11% weight loss and PEG feeding   INTERVENTION:  Recommend patient to continue osmolite  1.5, 2 cartons TID.  Wife preferred this regimen but we discussed if patient starts to vomit to give only 1 carton q 3-4 hours apart.  Flush with 65ml of water before and 151ml after each feeding, giving about 39ml of water with AM and PM medications. Recommended additional 254ml (part of this could be given with prosource) of water during the day for better hydration. Wife relunctant as did not want him up during the night to use the bathroom.   Recommend prosource 80ml q daily via feeding tube.  Wife instructed to mix with 30-61ml of water, flush tube with 19ml of water before and after.  RD to call [facility] to order Contact number provided to wife.     MONITORING, EVALUATION, GOAL: weight trends, intake, tube feeding tolerance   Next Visit: Tuesday, Jan 4th phone f/u  Kambrey Hagger B. Zenia Resides, [place], [place] Registered Dietitian [phone] (mobile)

## 2020-12-08 NOTE — Patient Instructions (Addendum)
[facility] Discharge Instructions for Patients Receiving Chemotherapy  Today you received the following chemotherapy agents Carboplatin (PARAPLATIN).  To help prevent nausea and vomiting after your treatment, we encourage you to take your nausea medication as prescribed.   If you develop nausea and vomiting that is not controlled by your nausea medication, call the clinic.   BELOW ARE SYMPTOMS THAT SHOULD BE REPORTED IMMEDIATELY:  *FEVER GREATER THAN 100.5 F  *CHILLS WITH OR WITHOUT FEVER  NAUSEA AND VOMITING THAT IS NOT CONTROLLED WITH YOUR NAUSEA MEDICATION  *UNUSUAL SHORTNESS OF BREATH  *UNUSUAL BRUISING OR BLEEDING  TENDERNESS IN MOUTH AND THROAT WITH OR WITHOUT PRESENCE OF ULCERS  *URINARY PROBLEMS  *BOWEL PROBLEMS  UNUSUAL RASH Items with * indicate a potential emergency and should be followed up as soon as possible.  Feel free to call the clinic should you have any questions or concerns. The clinic phone number is [phone].  Please show the [facility] at check-in to the Emergency Department and triage nurse.  Carboplatin injection What is this medicine? CARBOPLATIN (KAR boe pla tin) is a chemotherapy drug. It targets fast dividing cells, like cancer cells, and causes these cells to die. This medicine is used to treat ovarian cancer and many other cancers. This medicine may be used for other purposes; ask your health care provider or pharmacist if you have questions. COMMON BRAND NAME(S): Paraplatin What should I tell my health care provider before I take this medicine? They need to know if you have any of these conditions:  blood disorders  hearing problems  kidney disease  recent or ongoing radiation therapy  an unusual or allergic reaction to carboplatin, cisplatin, other chemotherapy, other medicines, foods, dyes, or preservatives  pregnant or trying to get pregnant  breast-feeding How should I use this medicine? This drug  is usually given as an infusion into a vein. It is administered in a hospital or clinic by a specially trained health care professional. Talk to your pediatrician regarding the use of this medicine in children. Special care may be needed. Overdosage: If you think you have taken too much of this medicine contact a poison control center or emergency room at once. NOTE: This medicine is only for you. Do not share this medicine with others. What if I miss a dose? It is important not to miss a dose. Call your doctor or health care professional if you are unable to keep an appointment. What may interact with this medicine?  medicines for seizures  medicines to increase blood counts like filgrastim, pegfilgrastim, sargramostim  some antibiotics like amikacin, gentamicin, neomycin, streptomycin, tobramycin  vaccines Talk to your doctor or health care professional before taking any of these medicines:  acetaminophen  aspirin  ibuprofen  ketoprofen  naproxen This list may not describe all possible interactions. Give your health care provider a list of all the medicines, herbs, non-prescription drugs, or dietary supplements you use. Also tell them if you smoke, drink alcohol, or use illegal drugs. Some items may interact with your medicine. What should I watch for while using this medicine? Your condition will be monitored carefully while you are receiving this medicine. You will need important blood work done while you are taking this medicine. This drug may make you feel generally unwell. This is not uncommon, as chemotherapy can affect healthy cells as well as cancer cells. Report any side effects. Continue your course of treatment even though you feel ill unless your doctor tells you to  stop. In some cases, you may be given additional medicines to help with side effects. Follow all directions for their use. Call your doctor or health care professional for advice if you get a fever, chills or  sore throat, or other symptoms of a cold or flu. Do not treat yourself. This drug decreases your body's ability to fight infections. Try to avoid being around people who are sick. This medicine may increase your risk to bruise or bleed. Call your doctor or health care professional if you notice any unusual bleeding. Be careful brushing and flossing your teeth or using a toothpick because you may get an infection or bleed more easily. If you have any dental work done, tell your dentist you are receiving this medicine. Avoid taking products that contain aspirin, acetaminophen, ibuprofen, naproxen, or ketoprofen unless instructed by your doctor. These medicines may hide a fever. Do not become pregnant while taking this medicine. Women should inform their doctor if they wish to become pregnant or think they might be pregnant. There is a potential for serious side effects to an unborn child. Talk to your health care professional or pharmacist for more information. Do not breast-feed an infant while taking this medicine. What side effects may I notice from receiving this medicine? Side effects that you should report to your doctor or health care professional as soon as possible:  allergic reactions like skin rash, itching or hives, swelling of the face, lips, or tongue  signs of infection - fever or chills, cough, sore throat, pain or difficulty passing urine  signs of decreased platelets or bleeding - bruising, pinpoint red spots on the skin, black, tarry stools, nosebleeds  signs of decreased red blood cells - unusually weak or tired, fainting spells, lightheadedness  breathing problems  changes in hearing  changes in vision  chest pain  high blood pressure  low blood counts - This drug may decrease the number of white blood cells, red blood cells and platelets. You may be at increased risk for infections and bleeding.  nausea and vomiting  pain, swelling, redness or irritation at the  injection site  pain, tingling, numbness in the hands or feet  problems with balance, talking, walking  trouble passing urine or change in the amount of urine Side effects that usually do not require medical attention (report to your doctor or health care professional if they continue or are bothersome):  hair loss  loss of appetite  metallic taste in the mouth or changes in taste This list may not describe all possible side effects. Call your doctor for medical advice about side effects. You may report side effects to FDA at 1-800-FDA-1088. Where should I keep my medicine? This drug is given in a hospital or clinic and will not be stored at home. NOTE: This sheet is a summary. It may not cover all possible information. If you have questions about this medicine, talk to your doctor, pharmacist, or health care provider.  2020 Elsevier/Gold Standard (2008-03-17 14:38:05)  Insulin Aspart injection What is this medicine? INSULIN ASPART (IN su lin AS part) is a human-made form of insulin. This drug lowers the amount of sugar in your blood. It is a fast acting insulin that starts working faster than regular insulin. It will not work as long as regular insulin. This medicine may be used for other purposes; ask your health care provider or pharmacist if you have questions. COMMON BRAND NAME(S): Fiasp, [employer], Medtronic, NovoLog, NovoLog Flexpen, NovoLog PenFill  What should I tell my health care provider before I take this medicine? They need to know if you have any of these conditions:  episodes of low blood sugar  eye disease, vision problems  kidney disease  liver disease  an unusual or allergic reaction to insulin, metacresol, other medicines, foods, dyes, or preservatives  pregnant or trying to get pregnant  breast-feeding How should I use this medicine? This medicine is for injection under the skin. Use exactly as directed. It is important to follow the  directions given to you by your health care professional or doctor. If you are using Novolog, you should start your meal within 5 to 10 minutes after injection. If you are using Fiasp, you should start your meal at the time of injection or within 20 minutes after injection. Have food ready before injection. Do not delay eating. You will be taught how to use this medicine and how to adjust doses for activities and illness. Do not use more insulin than prescribed. Do not use more or less often than prescribed. Always check the appearance of your insulin before using it. This medicine should be clear and colorless like water. Do not use if it is cloudy, thickened, colored, or has solid particles in it. If you use a pen, be sure to take off the outer needle cover before using the dose. It is important that you put your used needles and syringes in a special sharps container. Do not put them in a trash can. If you do not have a sharps container, call your pharmacist or healthcare provider to get one. This drug comes with INSTRUCTIONS FOR USE. Ask your pharmacist for directions on how to use this drug. Read the information carefully. Talk to your pharmacist or health care provider if you have questions. Talk to your pediatrician regarding the use of this medicine in children. While this drug may be prescribed for children as young as 2 years for selected conditions, precautions do apply. Overdosage: If you think you have taken too much of this medicine contact a poison control center or emergency room at once. NOTE: This medicine is only for you. Do not share this medicine with others. What if I miss a dose? It is important not to miss a dose. Your health care professional or doctor should discuss a plan for missed doses with you. If you do miss a dose, follow their plan. Do not take double doses. What may interact with this medicine?  other medicines for diabetes Many medications may cause an increase or  decrease in blood sugar, these include:  alcohol containing beverages  antiviral medicines for HIV or AIDS  aspirin and aspirin-like drugs  certain medicines for depression, anxiety, or psychotic disturbances  chromium  diuretics  male hormones, like estrogens or progestins and birth control pills  heart medicines  isoniazid  MAOIs like Carbex, Eldepryl, Marplan, Nardil, and Parnate  male hormones or anabolic steroids  medicines for weight loss  medicines for allergies, asthma, cold, or cough  niacin  NSAIDs, medicines for pain and inflammation, like ibuprofen or naproxen  octreotide  pentamidine  phenytoin  probenecid  quinolone antibiotics like ciprofloxacin, levofloxacin, ofloxacin  some herbal dietary supplements  steroid medicines like prednisone or cortisone  sulfamethoxazole; trimethoprim  thyroid medicine Some medications can hide the warning symptoms of low blood sugar. You may need to monitor your blood sugar more closely if you are taking one of these medications. These include:  beta-blockers such as atenolol, metoprolol,  propranolol  clonidine  guanethidine  reserpine This list may not describe all possible interactions. Give your health care provider a list of all the medicines, herbs, non-prescription drugs, or dietary supplements you use. Also tell them if you smoke, drink alcohol, or use illegal drugs. Some items may interact with your medicine. What should I watch for while using this medicine? Visit your health care professional or doctor for regular checks on your progress. A test called the HbA1C (A1C) will be monitored. This is a simple blood test. It measures your blood sugar control over the last 2 to 3 months. You will receive this test every 3 to 6 months. Learn how to check your blood sugar. Learn the symptoms of low and high blood sugar and how to manage them. Always carry a quick-source of sugar with you in case you have  symptoms of low blood sugar. Examples include hard sugar candy or glucose tablets. Make sure others know that you can choke if you eat or drink when you develop serious symptoms of low blood sugar, such as seizures or unconsciousness. They must get medical help at once. Tell your doctor or health care professional if you have high blood sugar. You might need to change the dose of your medicine. If you are sick or exercising more than usual, you might need to change the dose of your medicine. Do not skip meals. Ask your doctor or health care professional if you should avoid alcohol. Many nonprescription cough and cold products contain sugar or alcohol. These can affect blood sugar. Make sure that you have the right kind of syringe for the type of insulin you use. Try not to change the brand and type of insulin or syringe unless your health care professional or doctor tells you to. Switching insulin brand or type can cause dangerously high or low blood sugar. Always keep an extra supply of insulin, syringes, and needles on hand. Use a syringe one time only. Throw away syringe and needle in a closed container to prevent accidental needle sticks. Insulin pens and cartridges should never be shared. Even if the needle is changed, sharing may result in passing of viruses like hepatitis or HIV. Each time you get a new box of pen needles, check to see if they are the same type as the ones you were trained to use. If not, ask your health care professional to show you how to use this new type properly. Wear a medical ID bracelet or chain, and carry a card that describes your disease and details of your medicine and dosage times. What side effects may I notice from receiving this medicine? Side effects that you should report to your doctor or health care professional as soon as possible:  allergic reactions like skin rash, itching or hives, swelling of the face, lips, or tongue  breathing problems  signs and  symptoms of high blood sugar such as dizziness, dry mouth, dry skin, fruity breath, nausea, stomach pain, increased hunger or thirst, increased urination  signs and symptoms of low blood sugar such as feeling anxious, confusion, dizziness, increased hunger, unusually weak or tired, sweating, shakiness, cold, irritable, headache, blurred vision, fast heartbeat, loss of consciousness Side effects that usually do not require medical attention (report to your doctor or health care professional if they continue or are bothersome):  increase or decrease in fatty tissue under the skin due to overuse of a particular injection site  itching, burning, swelling, or rash at site where injected This  list may not describe all possible side effects. Call your doctor for medical advice about side effects. You may report side effects to FDA at 1-800-FDA-1088. Where should I keep my medicine? Keep out of the reach of children. Unopened Vials: Novolog Vials: Store in a refrigerator between 2 and 8 degrees C (36 and 46 degrees F) or at room temperature below 30 degrees C (86 degrees F). Do not freeze or use if the insulin has been frozen. Protect from light and excessive heat. If stored at room temperature, the vial must be discarded after 28 days. Throw away any unopened and unused medicine that has been stored in the refrigerator after the expiration date. Fiasp Vials: Store in a refrigerator between 2 and 8 degrees C (36 and 46 degrees F) or at room temperature below 30 degrees C (86 degrees F). Do not freeze or use if the insulin has been frozen. Protect from light and excessive heat. If stored at room temperature, the vial must be discarded after 28 days. Throw away any unopened and unused medicine that has been stored in the refrigerator after the expiration date. Unopened Pens and Cartridges: Novolog Flexpens and cartridges: Store in a refrigerator between 2 and 8 degrees C (36 and 46 degrees F) or at room  temperature below 30 degrees C (86 degrees F). Do not freeze or use if the insulin has been frozen. Protect from light and excessive heat. If stored at room temperature, the pen or cartridge must be discarded after 28 days. Throw away any unopened and unused medicine that has been stored in the refrigerator after the expiration date. Fiasp FlexTouch pens: Store in a refrigerator between 2 and 8 degrees C (36 and 46 degrees F) or at room temperature below 30 degrees C (86 degrees F). Do not freeze or use if the insulin has been frozen. Protect from light and excessive heat. If stored at room temperature, the pen must be discarded after 28 days. Throw away any unopened and unused medicine that has been stored in the refrigerator after the expiration date. Fiasp FlexTouch cartridges: Store at room temperature below 30 degrees C (86 degrees F). Do not refrigerate or freeze. Keep away from heat and light. Throw the cartridge away after 28 days, even if it still has insulin left in it. Vials that you are using: Novolog Vials: Store in the refrigerator or at room temperature below 30 degrees C (86 degrees F). Do not freeze. Keep away from heat and light. Throw the opened vial away after 28 days. Fiasp Vials: Store in the refrigerator or at room temperature below 30 degrees C (86 degrees F). Do not freeze. Keep away from heat and light. Throw the opened vial away after 28 days. Pens and cartridges that you are using: Novolog Flexpens and cartridges: Store at room temperature below 30 degrees C (86 degrees F). Do not refrigerate or freeze. Keep away from heat and light. Throw away the pen or cartridge after 28 days, even if it still has insulin left in it. Fiasp FlexTouch pens: Store in the refrigerator or at room temperature below 30 degrees C (86 degrees F). Do not freeze. Keep away from heat and light. Throw the pen away after 28 days, even if it still has insulin left in it. Fiasp FlexTouch cartridges: Store at  room temperature below 30 degrees C (86 degrees F). Do not refrigerate or freeze. Keep away from heat and light. Throw the cartridge away after 28 days, even if it still  has insulin left in it. NOTE: This sheet is a summary. It may not cover all possible information. If you have questions about this medicine, talk to your doctor, pharmacist, or health care provider.  2020 Elsevier/Gold Standard (2019-08-26 08:02:23)

## 2020-12-08 NOTE — Addendum Note (Signed)
Addended by: Jennet Maduro B on: 12/08/2020 11:42 AM   Modules accepted: Orders

## 2020-12-08 NOTE — Telephone Encounter (Signed)
Called [facility] per Dr. Chryl Heck and had PDL 1 testing to biopsy specimen.

## 2020-12-09 ENCOUNTER — Telehealth: Payer: Self-pay | Admitting: *Deleted

## 2020-12-09 ENCOUNTER — Other Ambulatory Visit (HOSPITAL_COMMUNITY): Payer: Self-pay | Admitting: Hematology and Oncology

## 2020-12-09 ENCOUNTER — Telehealth: Payer: Self-pay | Admitting: Internal Medicine

## 2020-12-09 ENCOUNTER — Ambulatory Visit: Payer: Medicare Other

## 2020-12-09 NOTE — Progress Notes (Signed)
DISCONTINUE OFF PATHWAY REGIMEN - Head and Neck   [id number]:Carboplatin AUC=1.5 Weekly + RT:   Administer weekly:     Carboplatin   **Always confirm dose/schedule in your pharmacy ordering system**  REASON: Other Reason PRIOR TREATMENT: Off Pathway: Carboplatin AUC=1.5 Weekly + RT TREATMENT RESPONSE: Unable to Evaluate  START OFF PATHWAY REGIMEN - Head and Neck   [id number]:Carboplatin + Paclitaxel (2/80) weekly:   Administer weekly:     Paclitaxel      Carboplatin   **Always confirm dose/schedule in your pharmacy ordering system**  Patient Characteristics: Larynx, Local Recurrence, Not a Candidate for Radiation Therapy, No Prior Platinum-Based Chemoradiation within 6 Months, PD-L1 Expression Negative (CPS < 1) / Unknown Disease Classification: Larynx AJCC T Category: [place] AJCC 8 Stage Grouping: Unknown Therapeutic Status: Local Recurrence AJCC N Category: cN2 AJCC M Category: M0 Is patient a candidate for radiation therapy<= No Prior Platinum Status: No Prior Platinum-Based Chemoradiation within 6 Months PD-L1 Expression Status: Awaiting Test Results Intent of Therapy: Non-Curative / Palliative Intent, Discussed with Patient

## 2020-12-09 NOTE — Progress Notes (Signed)
Pt unable to proceed with radiation PDL1 ordered and pending Discussed with Sandi Mealy our PA who had a visit with patient and recommended we proceed with combination chemotherapy, patient agreed to. He however has borderline PS hence not a candidate for aggressive therapy Will try and proceed with carbotaxol weekly unless PDL1 positive.  Marcus Collier

## 2020-12-09 NOTE — Progress Notes (Signed)
These results were reviewed with Dr. Chryl Heck and with the patient.

## 2020-12-10 ENCOUNTER — Ambulatory Visit: Payer: Medicare Other

## 2020-12-10 NOTE — Progress Notes (Signed)
[facility] Progress Note   Marcus Collier [id number] [birth year] 74 y.o.  Marcus Collier is managed by Dr. Benay Pike  Actively treated with chemotherapy/immunotherapy/hormonal therapy: Plans are dose cycle 1 of carboplatin today with plans to add paclitaxel and possibly immunotherapy.  Current therapy: Carboplatin  Next scheduled appointment with provider: 12/22/2020  Assessment: Plan:    Hyperglycemia - Plan: insulin aspart (novoLOG) injection 5 Units, insulin aspart (novoLOG) 100 UNIT/ML injection, Hemoglobin A1c  Squamous cell carcinoma of glottis (HCC) - Plan: HYDROcodone-acetaminophen (HYCET) 7.5-325 mg/15 ml solution, CBC with Differential ([place]), CMP ([place] only)  Neoplasm related pain - Plan: HYDROcodone-acetaminophen (HYCET) 7.5-325 mg/15 ml solution  Nausea without vomiting - Plan: ondansetron (ZOFRAN ODT) 4 MG disintegrating tablet, promethazine (PHENERGAN) 6.25 MG/5ML syrup   Hyperglycemia: The patient's wife states that he does not have a diagnosis of diabetes although his chemistry panel returned today with a blood sugar of 381.  Over the past 2 months his blood sugar has ranged from a low of 116 to aI high of 400.  I asked Marcus Collier wife to check his blood sugar at home as he has a glucometer.  He was given 5 units of insulin today.  A chemistry panel and a hemoglobin A1c will be collected on his return.  Squamous cell carcinoma of the glottis: Marcus Collier will receive cycle 1, day 1 of carboplatin today.  He will return for follow-up on 12/11/2020 with plans for treatment only on 12/15/2020.  Neoplasm related pain: Patient was given a refill of Hycet.  He will increase his fentanyl patches to two 25 mcg patches every 72 hours.  Nausea prophylaxis.  The patient was given a prescription for Zofran ODT 4 mg and Phenergan 6.25 mg with instructions that he could use 10 mg every 6 hours as needed for nausea.  Please see After  Visit Summary for patient specific instructions.  Future Appointments  Date Time Provider [place]  12/16/2020 10:30 AM [facility] LAB [facility] None  12/09/2020 11:00 AM Aireal Slater, Lucianne Lei E., PA-C [facility] None  12/15/2020 12:00 PM [facility] INFUSION [facility] None  12/22/2020  2:00 PM [facility] LAB [facility] None  12/22/2020  2:30 PM Stevan Eberwein, Lucianne Lei E., PA-C [facility] None  12/22/2020  3:15 PM [facility] INFUSION [facility] None  12/28/2020 10:30 AM Jennet Maduro, RD [facility] None  01/03/2021 11:20 AM [facility] LINAC 3 [facility] None  01/04/2021 11:15 AM [facility] LINAC 3 [facility] None  01/05/2021 11:15 AM [facility] LINAC 3 [facility] None  01/05/2021 12:00 PM Neff, Barbara L, RD [facility] None  01/06/2021 11:15 AM [facility] LINAC 3 [facility] None  01/07/2021 11:15 AM [facility] LINAC 3 [facility] None  01/10/2021 11:15 AM [facility] LINAC 3 [facility] None  01/11/2021 11:15 AM [facility] LINAC 3 [facility] None  01/12/2021 11:15 AM [facility] LINAC 3 [facility] None  01/12/2021 12:00 PM Neff, Barbara L, RD [facility] None  01/13/2021 11:15 AM [facility] LINAC 3 [facility] None  01/14/2021 11:15 AM [facility] LINAC 3 [facility] None  01/17/2021 11:15 AM [facility] LINAC 3 [facility] None  01/18/2021 11:15 AM [facility] LINAC 3 [facility] None  01/18/2021 12:00 PM Neff, Barbara L, RD [facility] None  01/19/2021 11:15 AM [facility] LINAC 3 [facility] None  01/20/2021 11:15 AM [facility] LINAC 3 [facility] None  01/21/2021 11:15 AM [facility] LINAC 3 [facility] None  01/24/2021 11:15 AM [facility] LINAC 3 [facility] None  01/25/2021 11:15 AM [facility] LINAC 3 [facility] None  01/26/2021 11:15 AM [facility] LINAC 3 [facility] None  01/26/2021  1:00 PM [employer],  Blaire L, PT [facility] None  01/27/2021 11:15 AM [facility] LINAC 3 [facility] None  01/28/2021 11:15 AM [facility] LINAC 3 [facility] None  01/31/2021 11:15 AM [facility] LINAC 3  [facility] None  02/01/2021 11:15 AM [facility] LINAC 3 [facility] None  02/02/2021 11:15 AM [facility] LINAC 3 [facility] None  02/03/2021 11:15 AM [facility] LINAC 3 [facility] None  02/04/2021 11:15 AM [facility] LINAC 3 [facility] None  02/07/2021 11:15 AM [facility] LINAC 3 [facility] None  02/08/2021 11:15 AM [facility] LINAC 3 [facility] None  02/09/2021 11:15 AM [facility] LINAC 3 [facility] None  02/10/2021 11:15 AM [facility] LINAC 3 [facility] None  02/11/2021 11:15 AM [facility] LINAC 3 [facility] None  02/14/2021 11:15 AM [facility] LINAC 3 [facility] None  02/15/2021 11:15 AM [facility] LINAC 3 [facility] None  02/16/2021 11:15 AM [facility] LINAC 3 [facility] None    Orders Placed This Encounter  Procedures   CBC with Differential ([place])   CMP ([place] only)   Hemoglobin A1c       Subjective:   Patient ID:  Marcus Collier is a 74 y.o. (DOB 06/01/1946) male.  Chief Complaint: No chief complaint on file.   HPI Marcus Collier  is a 74 y.o. male with a diagnosis of a squamous cell carcinoma of the glottis.  He is followed by Dr. Chryl Collier is to receive cycle 1, day 1 of carboplatin today.  He presents to the clinic today with his wife.  He continues to use Hycet 7.5-325 mg per 15 mL for breakthrough pain and is using a 15 mcg Duragesic patch without good relief of his pain.  He is unable to speak and has to use a board to write for communications.  He is agreeable to begin chemotherapy today.  He has significant swelling in his bilateral neck greater on the left than right.  He is using a G-tube for nourishment.  Medications: I have reviewed the patient's current medications.  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    S/P radiation therapy 12/10/2012 - 01/20/2013   Vocal Cords/ 63 Gray/ 28 Fractions   Unspecified malignant neoplasm of skin, site unspecified     Past Surgical History:  Procedure Laterality Date   Biopsy of Vocal cord  11/15/12   Right and Left   Biospy mouth  11/15/12   CARDIAC CATHETERIZATION     with stents    CLOSURE ORAL ANTRAL FISTULA N/A 10/08/2020   Procedure: CLOSURE ORAL ANTRAL FISTULA;  Surgeon: Izora Gala, MD;  Location: [place];  Service: ENT;  Laterality: N/A;   COLONOSCOPY     CORONARY ANGIOPLASTY     DIRECT LARYNGOSCOPY N/A 08/11/2020   Procedure: DIRECT LARYNGOSCOPY WITH BIOPSY;  Surgeon: Izora Gala, MD;  Location: [place];  Service: ENT;  Laterality: N/A;   GASTROSTOMY TUBE PLACEMENT  09/28/2020   IR GASTROSTOMY TUBE MOD SED  09/28/2020   LARYNGETOMY N/A 09/14/2020   Procedure: LARYNGECTOMY Total;  Surgeon: Izora Gala, MD;  Location: [place];  Service: ENT;  Laterality: N/A;  NEEDS RNFA   PECTORALIS FLAP N/A 10/08/2020   Procedure: PHARYNGEAL RECONSTRUCTION WITH PECTORALIS MAJOR MYOCUTANEOUS FLAP;  Surgeon: Cindra Presume, MD;  Location: [place];  Service: Plastics;  Laterality: N/A;   SKIN SPLIT GRAFT N/A 10/08/2020   Procedure: SKIN GRAFT SPLIT THICKNESS;  Surgeon: Cindra Presume, MD;  Location: [place];  Service: Plastics;  Laterality: N/A;    Family History  Problem Relation Age of Onset   Heart attack Mother    Heart attack Father    Hypertension Father     Social History   Socioeconomic History   Marital status: Married    Spouse name: Not on file   Number of children: 2   Years of education: Not on file   Highest education level: Not on file  Occupational History   Not on file  Tobacco Use   Smoking  status: Former Smoker    Packs/day: 1.00    Years: 52.00    Pack years: 52.00    Types: Cigarettes    Quit date: 08/2020    Years since quitting: 0.2   Smokeless tobacco: Never Used   Tobacco comment: stooped in 2003 started again and stopped 2021  Vaping Use   Vaping Use: Never used  Substance and Sexual Activity   Alcohol use: Not Currently    Comment: social drinker   Drug use: Never   Sexual activity: Not Currently    Partners: Female  Other Topics Concern   Not on file  Social History Narrative   Not on file   Social Determinants of Health   Financial Resource Strain: Low Risk    Difficulty of Paying Living Expenses: Not hard at all  Food Insecurity: No Food Insecurity   Worried About Charity fundraiser in the Last Year: Never true   [place] in the Last Year: Never true  Transportation Needs: No Transportation Needs   Lack of Transportation (Medical): No   Lack of Transportation (Non-Medical): No  Physical Activity: Not on file  Stress: No Stress Concern Present   Feeling of Stress : Only a little  Social Connections: Engineer, building services of Communication with Friends and Family: Twice a week   Frequency of Social Gatherings with Friends and Family: Once a week   Attends Religious Services: More than 4 times per year   Active Member of [employer] or Organizations: Yes   Attends Music therapist: More than 4 times per year   Marital Status: Married  Human resources officer Violence: Not on file    Past Medical History, Surgical history, Social history, and Family history were reviewed and updated as appropriate.   Please see review of systems for further details on the patient's review from today.   Review of Systems:  Review of Systems  Constitutional: Negative for chills, diaphoresis and fever.  HENT: Positive for trouble swallowing and voice change.        Swelling and pain of the bilateral neck.  Respiratory:  Negative for cough, chest tightness, shortness of breath and wheezing.   Cardiovascular: Negative for chest pain and palpitations.  Gastrointestinal: Negative for abdominal pain, constipation, diarrhea, nausea and vomiting.  Musculoskeletal: Negative for back pain and myalgias.  Neurological: Negative for dizziness, light-headedness and headaches.    Objective:   Physical Exam:  BP (!) 145/61 (BP Location: Left Arm, Patient Position: Sitting)    Pulse 100    Temp 98.9 F (37.2 C) (Tympanic)  Resp 18    Ht 5' 8.5" (1.74 m)    Wt 143 lb 14.4 oz (65.3 kg)    SpO2 97%    BMI 21.56 kg/m  ECOG: 1  Physical Exam Constitutional:      General: He is not in acute distress.    Appearance: Normal appearance. He is not ill-appearing or diaphoretic.  HENT:     Head: Normocephalic and atraumatic.  Neck:   Cardiovascular:     Rate and Rhythm: Normal rate and regular rhythm.     Heart sounds: No murmur heard. No friction rub. No gallop.   Pulmonary:     Effort: Pulmonary effort is normal. No respiratory distress.     Breath sounds: Normal breath sounds. No wheezing, rhonchi or rales.  Abdominal:     General: Bowel sounds are normal.    Lymphadenopathy:     Cervical: Cervical adenopathy present.  Skin:    General: Skin is warm and dry.  Neurological:     Mental Status: He is alert.     Gait: Gait abnormal (Patient is ambulating with the use of a wheelchair.).     Lab Review:     Component Value Date/Time   NA 131 (L) 12/08/2020 1337   K 4.2 12/08/2020 1337   CL 98 12/08/2020 1337   CO2 28 12/08/2020 1337   GLUCOSE 381 (H) 12/08/2020 1337   BUN 14 12/08/2020 1337   CREATININE 0.71 12/08/2020 1337   CALCIUM 9.0 12/08/2020 1337   PROT 6.6 12/08/2020 1337   ALBUMIN 2.0 (L) 12/08/2020 1337   AST 18 12/08/2020 1337   ALT 37 12/08/2020 1337   ALKPHOS 159 (H) 12/08/2020 1337   BILITOT 0.3 12/08/2020 1337   GFRNONAA >60 12/08/2020 1337   GFRAA >60 09/18/2020 0136        Component Value Date/Time   WBC 36.5 (H) 12/08/2020 1337   RBC 3.59 (L) 12/08/2020 1337   HGB 10.7 (L) 12/08/2020 1337   HCT 32.2 (L) 12/08/2020 1337   PLT 436 (H) 12/08/2020 1337   MCV 89.7 12/08/2020 1337   MCH 29.8 12/08/2020 1337   MCHC 33.2 12/08/2020 1337   RDW 14.0 12/08/2020 1337   LYMPHSABS 1.3 12/08/2020 1337   MONOABS 1.7 (H) 12/08/2020 1337   EOSABS 0.0 12/08/2020 1337   BASOSABS 0.1 12/08/2020 1337   -------------------------------  Imaging from last 24 hours (if applicable):  Radiology interpretation: Leanne Chang OP MEDICARE SPEECH PATH  Result Date: 12/03/2020 Objective Swallowing Evaluation: Type of Study: MBS-Modified Barium Swallow Study  Patient Details Name: Tadhg Eskew MRN: [id number] Date of Birth: Jan 01, 1946 Today's Date: 12/03/2020 Time: SLP Start Time (ACUTE ONLY): 1140 -SLP Stop Time (ACUTE ONLY): 1210 SLP Time Calculation (min) (ACUTE ONLY): 30 min Past Medical History: Past Medical History: Diagnosis Date  Acute myocardial infarction ([place])   Aortic stenosis   mild AS 09/13/20 echo [facility])  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  Family history of adverse reaction to anesthesia   Headache   migraine  Hoarseness of voice   Hypertension   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   S/P radiation therapy 12/10/2012 - 01/20/2013  Vocal Cords/ 63 Gray/ 28 Fractions  Unspecified malignant neoplasm of skin, site unspecified  Past Surgical History: Past Surgical History: Procedure Laterality Date  Biopsy of Vocal cord  11/15/12  Right and Left  Biospy mouth  11/15/12  CARDIAC CATHETERIZATION    with stents   CLOSURE ORAL ANTRAL FISTULA N/A 10/08/2020  Procedure: CLOSURE ORAL ANTRAL FISTULA;  Surgeon: Izora Gala, MD;  Location: [place];  Service: ENT;  Laterality: N/A;  COLONOSCOPY    CORONARY ANGIOPLASTY    DIRECT LARYNGOSCOPY N/A 08/11/2020  Procedure: DIRECT LARYNGOSCOPY WITH BIOPSY;  Surgeon: Izora Gala, MD;   Location: [place];  Service: ENT;  Laterality: N/A;  GASTROSTOMY TUBE PLACEMENT  09/28/2020  IR GASTROSTOMY TUBE MOD SED  09/28/2020  LARYNGETOMY N/A 09/14/2020  Procedure: LARYNGECTOMY Total;  Surgeon: Izora Gala, MD;  Location: [place];  Service: ENT;  Laterality: N/A;  NEEDS RNFA  PECTORALIS FLAP N/A 10/08/2020  Procedure: PHARYNGEAL RECONSTRUCTION WITH PECTORALIS MAJOR MYOCUTANEOUS FLAP;  Surgeon: Cindra Presume, MD;  Location: [place];  Service: Plastics;  Laterality: N/A;  SKIN SPLIT GRAFT N/A 10/08/2020  Procedure: SKIN GRAFT SPLIT THICKNESS;  Surgeon: Cindra Presume, MD;  Location: [place];  Service: Plastics;  Laterality: N/A; HPI: Pt is a 74 yo male presenting for OP MBS at the recommendation of his OP SLP due to swallowing difficulties that include intermittent regurgitation. 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 He was readmitted in October for repair of a pharyngocutaneous fistula (G-tube also inserted at that time). Pt was recommended to start chemo/XRT but has not been making all appointments. Esophagram 11/17 showed no aspiration or leakage of contrast.  Subjective: pt has been trying liquids but says they "won't go down" Assessment / Plan / Recommendation CHL IP CLINICAL IMPRESSIONS 12/03/2020 Clinical Impression Pt has a severe oral dysphagia with near immobility of his tongue that precludes him from any posterior transit of boluses, needing oral suction for removal. Upon inspecting his tongue, it appears to be edematous and with altered texture (appearing coated and almost "hairy", also with moderate amounts of dried secretions that were removed). He cannot voluntarily move his tongue in any direction. He also describes reduced sensation and is not aware when he has thin liquids sitting on or around his tongue, although he has a heightened gag reflex. Attempted to  siphon thin barium via straw and place it further back in his mouth, but this also had to be suctioned out. Discussed results with pt and wife: with the severity of his dysphagia, continuing alternative means of nutrition is recommended. However, also considering the efffects of XRT on swallowing function (particularly if the swallowing musculature is not being used), and that with his total laryngectomy he is not at risk for aspiration outside of a fistula, recommend continuing to try to swallow small sips. Encouraged ongoing OP SLP f/u and completion of HEP. Also emphasized the importance of oral hygiene. His wife said that they plan to go to the cancer center from this appointment for possible XRT and to meet with Dr. Isidore Moos. SLP encouraged them to show her his tongue as well so that she can monitor this.  SLP Visit Diagnosis Dysphagia, oral phase (R13.11) Attention and concentration deficit following -- Frontal lobe and executive function deficit following -- Impact on safety and function Risk for inadequate nutrition/hydration   CHL IP TREATMENT RECOMMENDATION 12/03/2020 Treatment Recommendations Defer treatment plan to f/u with SLP   Prognosis 12/03/2020 Prognosis for Safe Diet Advancement -- Barriers to Reach Goals Severity of deficits Barriers/Prognosis Comment -- CHL IP DIET RECOMMENDATION 12/03/2020 SLP Diet Recommendations Alternative means - long-term;Thin liquid Liquid Administration via Spoon;Cup Medication Administration Via alternative means Compensations -- Postural Changes --  CHL IP OTHER RECOMMENDATIONS 12/03/2020 Recommended Consults -- Oral Care Recommendations Oral care QID Other Recommendations --   CHL IP FOLLOW UP RECOMMENDATIONS 12/03/2020 Follow up Recommendations Outpatient SLP   CHL IP FREQUENCY AND DURATION 09/15/2020 Speech Therapy Frequency (ACUTE ONLY) min 3x week Treatment Duration --      CHL IP ORAL PHASE 12/03/2020 Oral Phase Impaired Oral - Pudding Teaspoon -- Oral - Pudding  Cup -- Oral - Honey Teaspoon -- Oral - Honey Cup -- Oral - Nectar Teaspoon -- Oral - Nectar Cup -- Oral - Nectar Straw -- Oral - Thin Teaspoon -- Oral - Thin Cup Weak lingual manipulation;Incomplete tongue to palate contact;Reduced posterior propulsion;Left anterior bolus loss;Right anterior bolus loss;Lingual/palatal residue;Other (Comment) Oral - Thin Straw Weak lingual manipulation;Incomplete tongue to palate contact;Reduced posterior propulsion;Left anterior bolus loss;Right anterior bolus loss;Lingual/palatal residue;Other (Comment) Oral - Puree -- Oral - Mech Soft -- Oral - Regular -- Oral - Multi-Consistency -- Oral - Pill -- Oral Phase - Comment --  CHL IP PHARYNGEAL PHASE 12/03/2020 Pharyngeal Phase (No Data) Pharyngeal- Pudding Teaspoon -- Pharyngeal -- Pharyngeal- Pudding Cup -- Pharyngeal -- Pharyngeal- Honey Teaspoon -- Pharyngeal -- Pharyngeal- Honey Cup -- Pharyngeal -- Pharyngeal- Nectar Teaspoon -- Pharyngeal -- Pharyngeal- Nectar Cup -- Pharyngeal -- Pharyngeal- Nectar Straw -- Pharyngeal -- Pharyngeal- Thin Teaspoon -- Pharyngeal -- Pharyngeal- Thin Cup -- Pharyngeal -- Pharyngeal- Thin Straw -- Pharyngeal -- Pharyngeal- Puree -- Pharyngeal -- Pharyngeal- Mechanical Soft -- Pharyngeal -- Pharyngeal- Regular -- Pharyngeal -- Pharyngeal- Multi-consistency -- Pharyngeal -- Pharyngeal- Pill -- Pharyngeal -- Pharyngeal Comment --  CHL IP CERVICAL ESOPHAGEAL PHASE 12/03/2020 Cervical Esophageal Phase (No Data) Pudding Teaspoon -- Pudding Cup -- Honey Teaspoon -- Honey Cup -- Nectar Teaspoon -- Nectar Cup -- Nectar Straw -- Thin Teaspoon -- Thin Cup -- Thin Straw -- Puree -- Mechanical Soft -- Regular -- Multi-consistency -- Pill -- Cervical Esophageal Comment -- Osie Bond., M.A. [facility] Pager [phone] Office [phone] 12/03/2020, 1:55 PM            CLINICAL DATA:  Unable to swallow.  Laryngectomy with tracheostomy. EXAM: MODIFIED BARIUM SWALLOW TECHNIQUE: Different  consistencies of barium were administered orally to the patient by the Speech Pathologist. Imaging of the pharynx was performed in the lateral projection. The radiologist was present in the fluoroscopy room for this study, providing personal supervision. FLUOROSCOPY TIME:  Fluoroscopy Time:  0 minutes, 41 seconds Radiation Exposure Index (if provided by the fluoroscopic device): N/A Number of Acquired Spot Images: 0 COMPARISON:  None. FINDINGS: Thin barium: The patient was unable to swallow the barium. Upon visual inspection, the tongue appears swollen, and the patient is unable to move the tongue involuntarily. The patient had a strong gag reflex upon sectioning. Patient had difficulty proceeding the bolus of barium in the mouth. IMPRESSION: 1.  The patient was unable to swallow thin barium. Please refer to the Speech Pathologists report for complete details and recommendations. Electronically Signed   By: Grayling Schranz Clines M.D.   On: 12/03/2020 12:27   NM PET Image Initial (PI) Skull Base To Thigh  Result Date: 11/15/2020 CLINICAL DATA:  Subsequent treatment strategy for laryngeal carcinoma post laryngectomy and closure of oral antral fistula. EXAM: NUCLEAR MEDICINE PET SKULL BASE TO THIGH TECHNIQUE: 7.88 mCi F-18 FDG was injected intravenously. Full-ring PET imaging was performed from the skull base to thigh after the radiotracer. CT data was obtained and used for attenuation correction and anatomic localization. Fasting blood glucose: 155 mg/dl COMPARISON:  CT neck 11/01/2020 and 08/05/2020, chest CT 08/05/2020 and abdominal CT 09/28/2020. FINDINGS: Mediastinal blood pool activity: SUV max 2.0 NECK: The recently demonstrated bulky bilateral level II/III cervical adenopathy is markedly hypermetabolic.Node on the right measuring approximately 5.5 x 4.3 cm transverse on image 46/3 has an SUV max 26.0. Node on the left measuring approximately 5.1 x 3.6 cm on image 45/3 has an SUV max of 18.4. Apart from these  dominant hypermetabolic lymph nodes, no other hypermetabolic nodes are seen within the neck. No discrete lesions of the pharyngeal mucosal space are seen. Specifically, no right tonsillar hypermetabolic activity is present. The bulky cervical adenopathy exerts mild mass effect on the pharynx. Incidental CT findings: Status post total laryngectomy and tracheostomy placement. Bilateral carotid atherosclerosis. CHEST: There are no hypermetabolic mediastinal, hilar or axillary lymph nodes. No hypermetabolic pulmonary activity or suspicious pulmonary nodularity. Left upper lobe nodules measuring up to 6 mm on image 98/3 are stable. The previously demonstrated subpleural nodule in the right lower lobe appears smaller (image 129/3). Incidental CT findings: Moderate centrilobular and paraseptal emphysema. Diffuse atherosclerosis of the aorta, great vessels and coronary arteries. Calcifications of the aortic valve. ABDOMEN/PELVIS: There is no hypermetabolic activity within the liver, adrenal glands, spleen or pancreas. There is no hypermetabolic nodal activity. Incidental CT findings: Again demonstrated is a bilobed infrarenal abdominal aortic aneurysm which has a maximal transverse diameter of 3.7 cm proximally and 3.3 cm distally. Moderate stool throughout the colon. Sigmoid diverticulosis noted. SKELETON: Focal hypermetabolic activity within the left anterior chest wall, near the left 6th costochondral junction (SUV max 4.2). There is suspicion of a costal injury in this area (image 128/3). No chest wall mass. No other suspicious osseous activity. Incidental CT findings: none IMPRESSION: 1. The recently demonstrated bulky bilateral cervical adenopathy is intensely hypermetabolic consistent with metastatic disease. 2. No focal lesions of the pharyngeal mucosal space status post total laryngectomy. 3. No distant metastases. 4. Probable costal injury in the anterior chest wall at the left 6th costochondral junction. 5.  Stable bilobed abdominal aortic aneurysm. Recommend follow-up ultrasound every 2 years. This recommendation follows ACR consensus guidelines: White Paper of the ACR Incidental Findings Committee II on Vascular Findings. J Am Coll Radiol 2013; 10:789-794. Electronically Signed   By: Richardean Sale M.D.   On: 11/15/2020 16:41        Discussed with Dr. Chryl Collier.

## 2020-12-13 ENCOUNTER — Ambulatory Visit: Payer: Medicare Other

## 2020-12-14 ENCOUNTER — Ambulatory Visit: Payer: Medicare Other

## 2020-12-14 ENCOUNTER — Telehealth: Payer: Self-pay | Admitting: Emergency Medicine

## 2020-12-14 ENCOUNTER — Inpatient Hospital Stay: Payer: Medicare Other

## 2020-12-14 ENCOUNTER — Inpatient Hospital Stay: Payer: Medicare Other | Admitting: Nutrition

## 2020-12-14 ENCOUNTER — Inpatient Hospital Stay: Payer: Medicare Other | Admitting: Medical

## 2020-12-14 NOTE — Telephone Encounter (Signed)
Attempted to contact pt regarding missed appts this am.  Spoke with his son Jenny Reichmann who states that pt died this am.  Message being routed to Dr. Lanell Persons, PA Rozell Searing, and Dr. Rob Hickman office.

## 2020-12-15 ENCOUNTER — Inpatient Hospital Stay: Payer: Medicare Other

## 2020-12-15 ENCOUNTER — Ambulatory Visit: Payer: Medicare Other

## 2020-12-15 ENCOUNTER — Inpatient Hospital Stay: Payer: Medicare Other | Admitting: Hematology and Oncology

## 2020-12-15 ENCOUNTER — Encounter: Payer: Self-pay | Admitting: Radiation Oncology

## 2020-12-16 ENCOUNTER — Ambulatory Visit: Payer: Medicare Other

## 2020-12-16 NOTE — Progress Notes (Signed)
  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 had significant difficulty tolerating radiation therapy in the supine position due to severe headaches.  Despite loosening his mask and providing medications for pain and anxiety, he only tolerated one treatment after several attempts.  A decision was made with the patient and his wife to stop radiation therapy and proceed with chemotherapy for palliation.  A referral to palliative medicine was made.  Unfortunately, days after stopping radiation therapy, I was informed of [name] death.  A condolence card is circulating among his team to send to his wife.  -----------------------------------  Eppie Gibson, MD

## 2020-12-20 ENCOUNTER — Encounter: Payer: Medicare Other | Admitting: Nutrition

## 2020-12-20 ENCOUNTER — Ambulatory Visit: Payer: Medicare Other

## 2020-12-21 ENCOUNTER — Ambulatory Visit: Payer: Medicare Other

## 2020-12-22 ENCOUNTER — Inpatient Hospital Stay: Payer: Medicare Other | Admitting: Medical

## 2020-12-22 ENCOUNTER — Inpatient Hospital Stay: Payer: Medicare Other

## 2020-12-22 ENCOUNTER — Ambulatory Visit: Payer: Medicare Other

## 2020-12-22 NOTE — Therapy (Signed)
[facility] [address] [place], [place], [address] Phone: [phone]   Fax:  [phone]  Patient Details  Name: Mattheo Swindle MRN: [id number] Date of Birth: Feb 13, 1946 Referring Provider:  Eppie Gibson, MD  Encounter Date: 12/22/2020   SPEECH THERAPY DISCHARGE SUMMARY  Visits from Start of Care: 1 (eval)  Current functional level related to goals / functional outcomes: Goals derived at eval were as follows: SLP SHORT TERM GOAL #1     Title pt will tell SLP why pt is completing HEP with modified independence     Time 1     Period --   sessions, for all STGs    Status New          SLP SHORT TERM GOAL #2    Title pt will complete HEP with occasional min-mod A     Time 2     Period --   sessions    Status New          SLP SHORT TERM GOAL #3    Title pt will follow any postural, behavioral, or dietary suggestions fromany  objective swallow assessments with rare min A     Time 2     Status New                      SLP Long Term Goals - 11/25/20 1232                SLP LONG TERM GOAL #1     Title pt will follow any recommendations form objective swallow assessments with rare min A      Time 4      Period --   sessions, for all LTGs     Status New            SLP LONG TERM GOAL #2     Title pt will complete HEP with rare min A in 2 sessions      Time 3      Status New            SLP LONG TERM GOAL #3     Title pt will tell SLP how a food journal could hasten return to a more normalized diet      Time 3      Status New            SLP LONG TERM GOAL #4     Title pt will describe how to modify HEP over time, and the timeline associated with reduction in HEP frequency with modified independence over two sessions      Time 5      Status New       SLP SHORT TERM GOAL #1     Title pt will tell SLP why pt is completing HEP with modified independence     Time 1     Period --   sessions, for all STGs     Status New          SLP SHORT TERM GOAL #2    Title pt will complete HEP with occasional min-mod A     Time 2     Period --   sessions    Status New          SLP SHORT TERM GOAL #3    Title pt will follow any postural, behavioral, or dietary suggestions fromany  objective swallow assessments with rare min A  Time 2     Status New                      SLP Long Term Goals - 11/25/20 1232                SLP LONG TERM GOAL #1     Title pt will follow any recommendations form objective swallow assessments with rare min A      Time 4      Period --   sessions, for all LTGs     Status New            SLP LONG TERM GOAL #2     Title pt will complete HEP with rare min A in 2 sessions      Time 3      Status New            SLP LONG TERM GOAL #3     Title pt will tell SLP how a food journal could hasten return to a more normalized diet      Time 3      Status New            SLP LONG TERM GOAL #4     Title pt will describe how to modify HEP over time, and the timeline associated with reduction in HEP frequency with modified independence over two sessions      Time 5      Status New           Remaining deficits: Unfortunately, pt passed away prior to his next ST appointment.   Education / Equipment: Late effects head/neck radiation, HEP procedure, need modified to evaluate swallow function, cont to try sips of liquid at home   Plan: Patient agrees to discharge.  Patient goals were not met. Patient is being discharged due to a change in medical status.  ?????        SCHINKE,CARL ,MS, CCC-SLP  12/22/2020, 8:11 AM  [facility] [address] [place], [place], [address] Phone: [phone]   Fax:  [phone] 

## 2020-12-23 ENCOUNTER — Ambulatory Visit: Payer: Medicare Other

## 2020-12-25 DEATH — deceased

## 2020-12-27 ENCOUNTER — Other Ambulatory Visit: Payer: Self-pay | Admitting: Internal Medicine

## 2020-12-27 ENCOUNTER — Ambulatory Visit: Payer: Medicare Other

## 2020-12-28 ENCOUNTER — Ambulatory Visit: Payer: Medicare Other

## 2020-12-29 ENCOUNTER — Ambulatory Visit: Payer: Medicare Other

## 2020-12-30 ENCOUNTER — Ambulatory Visit: Payer: Medicare Other

## 2020-12-31 ENCOUNTER — Ambulatory Visit: Payer: Medicare Other

## 2021-01-03 ENCOUNTER — Ambulatory Visit: Payer: Medicare Other

## 2021-01-04 ENCOUNTER — Ambulatory Visit: Payer: Medicare Other

## 2021-01-05 ENCOUNTER — Encounter: Payer: Medicare Other | Admitting: Nutrition

## 2021-01-05 ENCOUNTER — Ambulatory Visit: Payer: Medicare Other

## 2021-01-06 ENCOUNTER — Ambulatory Visit: Payer: Medicare Other

## 2021-01-07 ENCOUNTER — Ambulatory Visit: Payer: Medicare Other

## 2021-01-10 ENCOUNTER — Ambulatory Visit: Payer: Medicare Other

## 2021-01-11 ENCOUNTER — Ambulatory Visit: Payer: Medicare Other

## 2021-01-12 ENCOUNTER — Ambulatory Visit: Payer: Medicare Other

## 2021-01-12 ENCOUNTER — Encounter: Payer: Medicare Other | Admitting: Nutrition

## 2021-01-13 ENCOUNTER — Ambulatory Visit: Payer: Medicare Other

## 2021-01-14 ENCOUNTER — Ambulatory Visit: Payer: Medicare Other

## 2021-01-17 ENCOUNTER — Ambulatory Visit: Payer: Medicare Other

## 2021-01-18 ENCOUNTER — Ambulatory Visit: Payer: Medicare Other

## 2021-01-18 ENCOUNTER — Encounter: Payer: Medicare Other | Admitting: Nutrition

## 2021-01-19 ENCOUNTER — Ambulatory Visit: Payer: Medicare Other

## 2021-01-20 ENCOUNTER — Ambulatory Visit: Payer: Medicare Other

## 2021-01-21 ENCOUNTER — Ambulatory Visit: Payer: Medicare Other

## 2021-01-24 ENCOUNTER — Ambulatory Visit: Payer: Medicare Other

## 2021-01-25 ENCOUNTER — Ambulatory Visit: Payer: Medicare Other

## 2021-01-26 ENCOUNTER — Ambulatory Visit: Payer: Medicare Other

## 2021-01-26 ENCOUNTER — Encounter: Payer: Self-pay | Admitting: Physical Therapy

## 2021-01-27 ENCOUNTER — Ambulatory Visit: Payer: Medicare Other

## 2021-01-28 ENCOUNTER — Ambulatory Visit: Payer: Medicare Other

## 2021-01-31 ENCOUNTER — Ambulatory Visit: Payer: Medicare Other

## 2021-02-01 ENCOUNTER — Ambulatory Visit: Payer: Medicare Other

## 2021-02-02 ENCOUNTER — Ambulatory Visit: Payer: Medicare Other

## 2021-02-03 ENCOUNTER — Ambulatory Visit: Payer: Medicare Other

## 2021-02-04 ENCOUNTER — Ambulatory Visit: Payer: Medicare Other

## 2021-02-07 ENCOUNTER — Ambulatory Visit: Payer: Medicare Other

## 2021-02-08 ENCOUNTER — Ambulatory Visit: Payer: Medicare Other

## 2021-02-09 ENCOUNTER — Ambulatory Visit: Payer: Medicare Other

## 2021-02-10 ENCOUNTER — Ambulatory Visit: Payer: Medicare Other

## 2021-02-11 ENCOUNTER — Ambulatory Visit: Payer: Medicare Other

## 2021-02-14 ENCOUNTER — Ambulatory Visit: Payer: Medicare Other

## 2021-02-15 ENCOUNTER — Ambulatory Visit: Payer: Medicare Other

## 2021-02-16 ENCOUNTER — Ambulatory Visit: Payer: Medicare Other

## 2021-03-22 IMAGING — CT CT HEAD WO/W CM
4 of 10 series · 11 of 33 positions shown, 12 images · IV contrast (iopamidol)
Comparison: Head CT 04/15/2019.

CLINICAL DATA: Laryngeal cancer.  Headaches for 2 months.

EXAM:
CT HEAD WITHOUT AND WITH CONTRAST
TECHNIQUE: Contiguous axial images were obtained from the base of the skull
through the vertex without and with intravenous contrast
CONTRAST:  75mL 6BDTDI-W22 IOPAMIDOL ([id number]-W22) INJECTION 61%

[Series 5: neck 2.00 br40 s3 st/ no angle · axial · 0.59mm/px · z∈[-710,-628]mm · 2 of 125 slices shown, 3 images]
[im 42/125  soft-tissue]
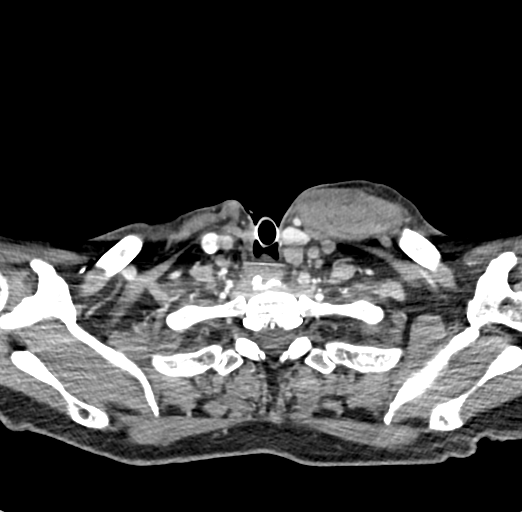
[im 42/125  bone]
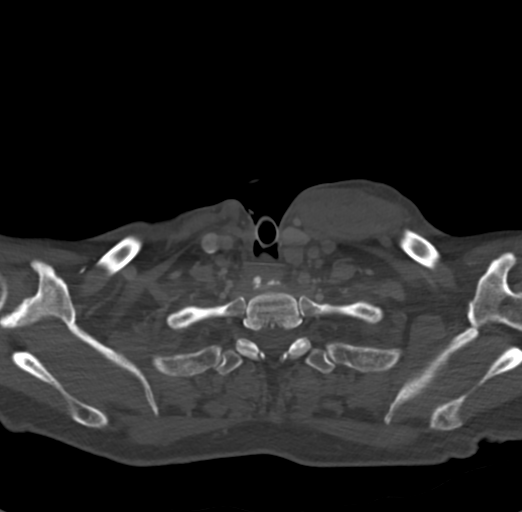
[im 83/125  bone]
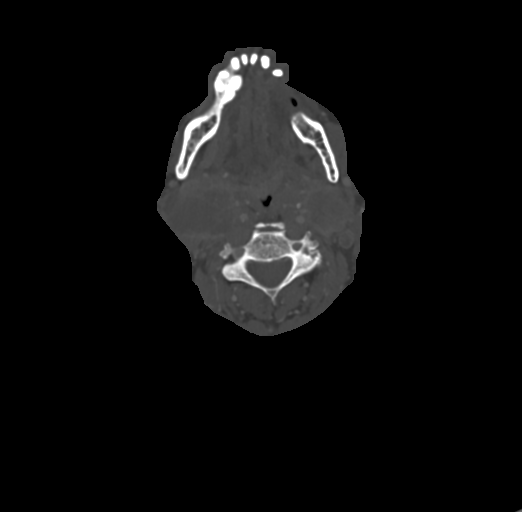

[Series 7: neck 2.00 br60 s3 bone/ no angle · axial · 0.59mm/px · z∈[-710,-628]mm · 2 of 125 slices shown]
[im 42/125  bone]
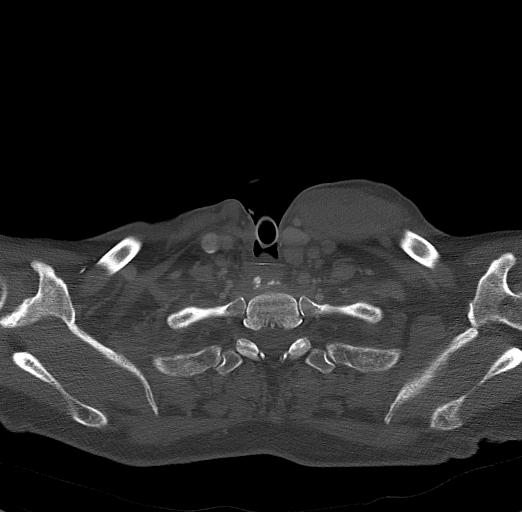
[im 83/125  bone]
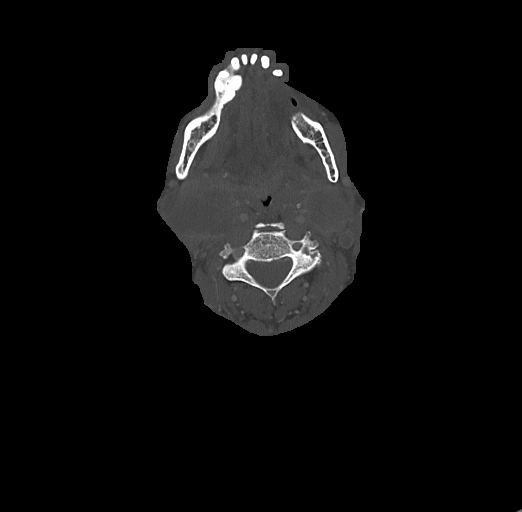

[Series 13: neck 2.00 br40 s3 (person_name) · coronal · 0.49mm/px · 2 of 171 slices shown (1 of 2)]
[im 57/171  bone]
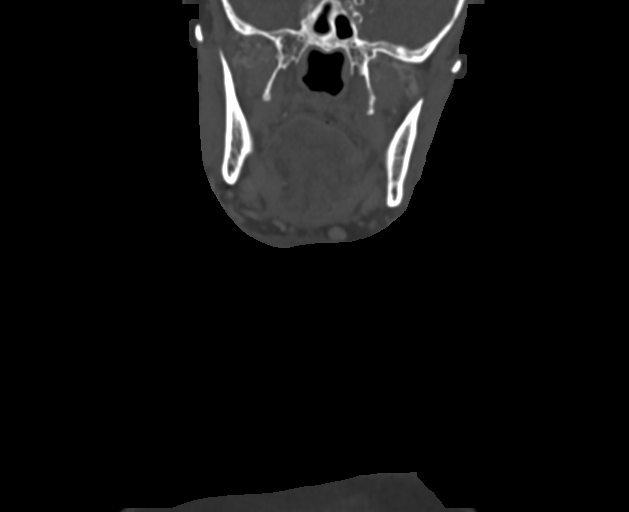
[im 114/171  bone]
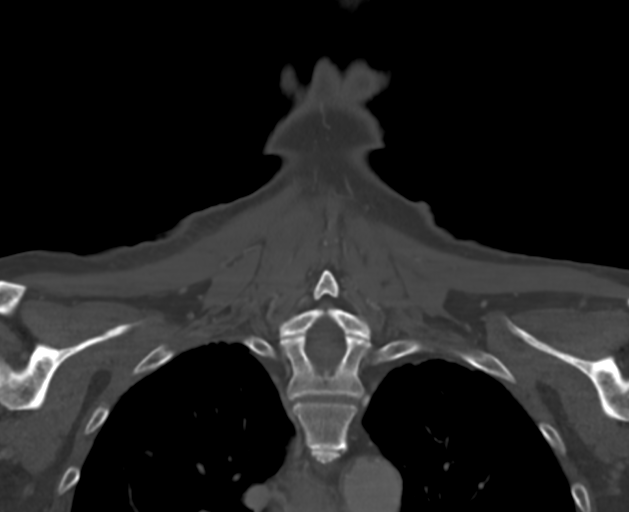

[Series 15: neck 2.00 br40 s3 (person_name) · sagittal · 0.49mm/px · 5 of 154 slices shown (2 of 2)]
[im 39/154  bone]
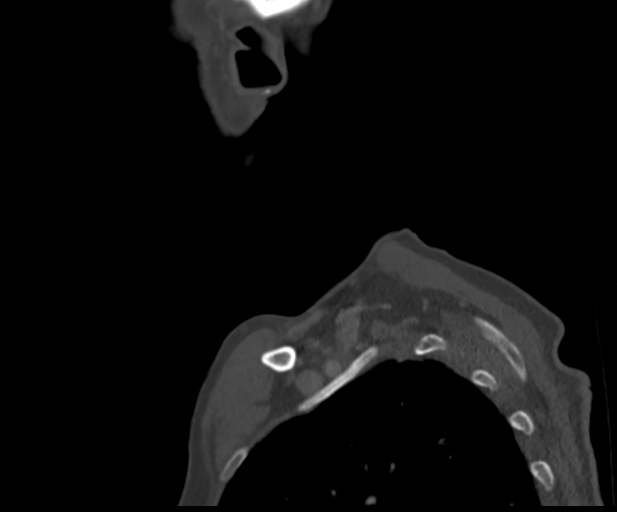
[im 58/154  bone]
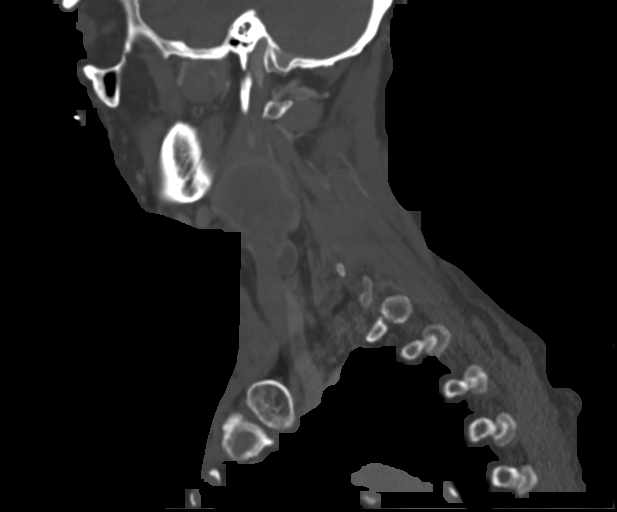
[im 77/154  bone]
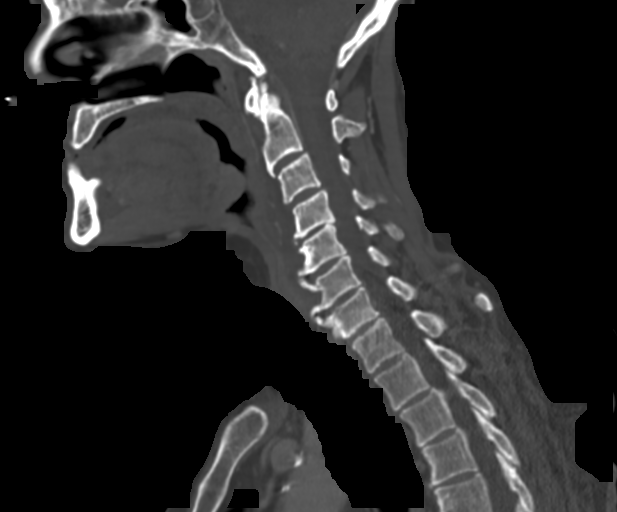
[im 96/154  bone]
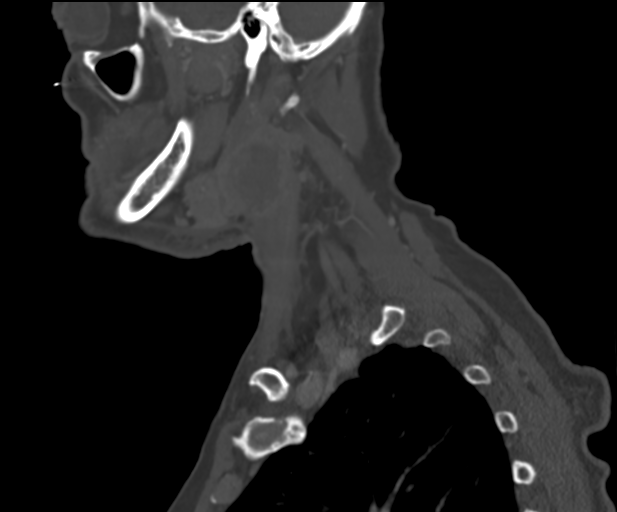
[im 115/154  bone]
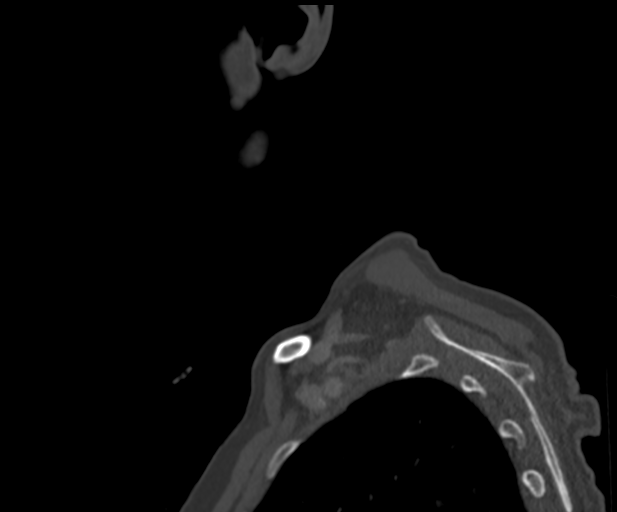

[11 of 33 positions shown; findings below may reference images not displayed]

FINDINGS: Brain:

Mild generalized cerebral atrophy.

There is no acute intracranial hemorrhage.

No demarcated cortical infarct.

No extra-axial fluid collection.

No evidence of intracranial mass.

No midline shift.

Partially empty sella turcica.

No abnormal intracranial enhancement.

Vascular: No hyperdense vessel is identified on precontrast imaging.
Atherosclerotic calcifications. Expected vascular enhancement.

Skull: Normal. Negative for fracture or focal lesion.

Sinuses/Orbits: Visualized orbits show no acute finding. No
significant paranasal sinus disease at the imaged levels.
IMPRESSION: No evidence of acute intracranial abnormality.

Mild cerebral atrophy, stable as compared to the head CT of
04/15/2019.

## 2023-11-14 NOTE — Telephone Encounter (Signed)
Telephone call
